# Patient Record
Sex: Male | Born: 1960 | Race: White | Hispanic: No | Marital: Single | Smoking: Current every day smoker
Health system: Southern US, Community
[De-identification: ages and names within clinical notes are randomized; demographics above are authoritative.]

## PROBLEM LIST (undated history)

## (undated) DIAGNOSIS — I509 Heart failure, unspecified: Secondary | ICD-10-CM

## (undated) DIAGNOSIS — I2510 Atherosclerotic heart disease of native coronary artery without angina pectoris: Secondary | ICD-10-CM

## (undated) DIAGNOSIS — M1990 Unspecified osteoarthritis, unspecified site: Secondary | ICD-10-CM

## (undated) DIAGNOSIS — I10 Essential (primary) hypertension: Secondary | ICD-10-CM

## (undated) DIAGNOSIS — J449 Chronic obstructive pulmonary disease, unspecified: Secondary | ICD-10-CM

## (undated) DIAGNOSIS — E785 Hyperlipidemia, unspecified: Secondary | ICD-10-CM

## (undated) DIAGNOSIS — G8929 Other chronic pain: Secondary | ICD-10-CM

## (undated) HISTORY — PX: OTHER SURGICAL HISTORY: SHX169

---

## 2018-08-22 ENCOUNTER — Ambulatory Visit
Admission: EM | Admit: 2018-08-22 | Discharge: 2018-08-22 | Disposition: A | Discharge status: Home / Self Care | Payer: Medicare Other

## 2018-08-22 ENCOUNTER — Other Ambulatory Visit: Payer: Self-pay

## 2018-08-22 DIAGNOSIS — G4700 Insomnia, unspecified: Secondary | ICD-10-CM | POA: Diagnosis not present

## 2018-08-22 HISTORY — DX: Chronic obstructive pulmonary disease, unspecified: J44.9

## 2018-08-22 HISTORY — DX: Unspecified osteoarthritis, unspecified site: M19.90

## 2018-08-22 HISTORY — DX: Heart failure, unspecified: I50.9

## 2018-08-22 HISTORY — DX: Atherosclerotic heart disease of native coronary artery without angina pectoris: I25.10

## 2018-08-22 HISTORY — DX: Essential (primary) hypertension: I10

## 2018-08-22 MED ORDER — TEMAZEPAM 15 MG PO CAPS: 15.0000 mg | ORAL_CAPSULE | Freq: Every evening | ORAL | 0 refills | Status: DC | PRN
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
## 2018-08-22 NOTE — ED Triage Notes (Signed)
Pt states hasn't slept good in the past 3wks, states going 48hrs with out being able to sleep. States he is stressed from not sleeping and now having headaches. States used OTC sleep aide with no relief.

## 2018-08-22 NOTE — Discharge Instructions (Signed)
Start temazepam one pill every 2-3 days to help with insomnia at this time. Maintain sleep hygiene as discussed on attachment. Follow up with PCP for further evaluation needed.

## 2018-08-31 ENCOUNTER — Ambulatory Visit
Admission: EM | Admit: 2018-08-31 | Discharge: 2018-08-31 | Disposition: A | Discharge status: Home / Self Care | Payer: Medicare Other | Attending: Physician Assistant | Admitting: Physician Assistant

## 2018-08-31 ENCOUNTER — Other Ambulatory Visit: Payer: Self-pay

## 2018-08-31 ENCOUNTER — Encounter: Payer: Self-pay | Admitting: Emergency Medicine

## 2018-08-31 DIAGNOSIS — M545 Low back pain, unspecified: Secondary | ICD-10-CM

## 2018-08-31 MED ORDER — PREDNISONE 50 MG PO TABS: 50.0000 mg | ORAL_TABLET | Freq: Every day | ORAL | 0 refills | Status: DC

## 2018-08-31 MED ORDER — TIZANIDINE HCL 4 MG PO TABS: 4.0000 mg | ORAL_TABLET | Freq: Four times a day (QID) | ORAL | 0 refills | Status: DC | PRN

## 2018-08-31 NOTE — Discharge Instructions (Signed)
Start prednisone as directed. Tizanidine as needed, this can make you drowsy, so do not take if you are going to drive, operate heavy machinery, or make important decisions. Ice/heat compresses as needed. This can take up to 3-4 weeks to completely resolve, but you should be feeling better each week. Follow up with PCP if symptoms worsen, changes for reevaluation. If experience numbness/tingling of the inner thighs, loss of bladder or bowel control, go to the emergency department for evaluation.

## 2018-08-31 NOTE — ED Provider Notes (Signed)
[facility]    CSN: [id number] Arrival date & time: 08/31/18  1123      History   Chief Complaint Chief Complaint  Patient presents with  . Back Pain    HPI Troy French is a 58 y.o. male.   58 year old male comes in for 3-day history of right lumbar back pain.  Denies injury/trauma.  He has history of back pain requiring corticosteroid injections to the spine.  He is to be on narcotics to control back pain, but has not had it filled since he moved to [place].  States he woke up with this pain, that is worse with movement, better with sitting.  At times the pain can radiate down the leg.  Denies saddle anesthesia, loss of bladder or bowel control.  He has been using Tylenol, aspirin, heat and ice compress with mild relief.  States given he has an interview in 2 days, he would like to try other medications to help with symptoms improve.     Past Medical History:  Diagnosis Date  . Arthritis   . CHF (congestive heart failure) ([place])   . COPD (chronic obstructive pulmonary disease) ([place])   . Coronary artery disease   . Hypertension     There are no active problems to display for this patient.   Past Surgical History:  Procedure Laterality Date  . cardiac stents         Home Medications    Prior to Admission medications   Medication Sig Start Date End Date Taking? Authorizing Provider  aspirin 81 MG chewable tablet Chew by mouth daily.    [provider]  clopidogrel (PLAVIX) 75 MG tablet Take 75 mg by mouth daily.    [provider]  diltiazem (DILACOR XR) 240 MG 24 hr capsule Take 240 mg by mouth daily.    [provider]  HYDROcodone-acetaminophen (NORCO) 10-325 MG tablet Take 1 tablet by mouth 3 (three) times daily.    [provider]  lisinopril (ZESTRIL) 10 MG tablet Take 10 mg by mouth daily.    [provider]  morphine (MS CONTIN) 30 MG 12 hr tablet Take 30 mg by mouth every 12 (twelve) hours.     [provider]  predniSONE (DELTASONE) 50 MG tablet Take 1 tablet (50 mg total) by mouth daily with breakfast. 08/31/18   Tasia Catchings, Aamari West V, PA-C  temazepam (RESTORIL) 15 MG capsule Take 1 capsule (15 mg total) by mouth at bedtime as needed for sleep. 08/22/18   Tasia Catchings, Stevee Valenta V, PA-C  tiZANidine (ZANAFLEX) 4 MG tablet Take 1 tablet (4 mg total) by mouth every 6 (six) hours as needed for muscle spasms. 08/31/18   Tasia Catchings, Arlena Marsan V, PA-C  traZODone (DESYREL) 50 MG tablet Take 50 mg by mouth at bedtime.    [provider]    Family History History reviewed. No pertinent family history.  Social History Social History   Tobacco Use  . Smoking status: Current Every Day Smoker  . Smokeless tobacco: Never Used  Substance Use Topics  . Alcohol use: Not Currently  . Drug use: Not on file     Allergies   Nitroglycerin   Review of Systems Review of Systems  Reason unable to perform ROS: See HPI as above.     Physical Exam Triage Vital Signs ED Triage Vitals  Enc Vitals Group     BP 08/31/18 1133 (!) 144/98     Pulse Rate 08/31/18 1133 98     Resp  08/31/18 1133 18     Temp 08/31/18 1133 98 F (36.7 C)     Temp Source 08/31/18 1133 Oral     SpO2 08/31/18 1133 98 %     Weight --      Height --      Head Circumference --      Peak Flow --      Pain Score 08/31/18 1134 8     Pain Loc --      Pain Edu? --      Excl. in [place]? --    No data found.  Updated Vital Signs BP (!) 144/98 (BP Location: Right Arm)   Pulse 98   Temp 98 F (36.7 C) (Oral)   Resp 18   SpO2 98%   Physical Exam Constitutional:      General: He is not in acute distress.    Appearance: He is well-developed. He is not diaphoretic.  HENT:     Head: Normocephalic and atraumatic.  Eyes:     Conjunctiva/sclera: Conjunctivae normal.     Pupils: Pupils are equal, round, and reactive to light.  Cardiovascular:     Rate and Rhythm: Normal rate and regular rhythm.     Heart sounds: Normal heart sounds. No murmur.  No friction rub. No gallop.   Pulmonary:     Effort: Pulmonary effort is normal. No accessory muscle usage or respiratory distress.     Breath sounds: Normal breath sounds. No stridor. No decreased breath sounds, wheezing, rhonchi or rales.  Musculoskeletal:     Comments: No tenderness on palpation of the spinous process. Tenderness to palpation along right lumbar paraspinal area. Decreased ROM of back and hips. Full passive ROM of hips. NVI. Negative straight leg raise.  Skin:    General: Skin is warm and dry.  Neurological:     Mental Status: He is alert and oriented to person, place, and time.    UC Treatments / Results  Labs (all labs ordered are listed, but only abnormal results are displayed) Labs Reviewed - No data to display  EKG   Radiology No results found.  Procedures Procedures (including critical care time)  Medications Ordered in UC Medications - No data to display  Initial Impression / Assessment and Plan / UC Course  I have reviewed the triage vital signs and the nursing notes.  Pertinent labs & imaging results that were available during my care of the patient were reviewed by me and considered in my medical decision making (see chart for details).    We will start prednisone as directed.  Tizanidine as needed.  Offered Toradol injection in office today, patient declined at this time.  Continue other symptomatic treatment.  Return precautions given.  Patient expresses understanding and agrees with plan.  Final Clinical Impressions(s) / UC Diagnoses   Final diagnoses:  Acute right-sided low back pain without sciatica    ED Prescriptions    Medication Sig Dispense Auth. Provider   predniSONE (DELTASONE) 50 MG tablet Take 1 tablet (50 mg total) by mouth daily with breakfast. 5 tablet Eyan Hagood V, PA-C   tiZANidine (ZANAFLEX) 4 MG tablet Take 1 tablet (4 mg total) by mouth every 6 (six) hours as needed for muscle spasms. 15 tablet Threasa AlphaYu, Dayne Dekay V, PA-C         Admire Bunnell V, [place]PA-C 08/31/18 1215

## 2018-08-31 NOTE — ED Triage Notes (Signed)
Pt sts lower back pain on right side x 3 days with hx of same

## 2018-11-20 ENCOUNTER — Ambulatory Visit: Payer: Medicare Other | Admitting: Cardiovascular Disease

## 2018-12-17 ENCOUNTER — Other Ambulatory Visit: Payer: Self-pay

## 2018-12-17 ENCOUNTER — Encounter: Payer: Self-pay | Admitting: Cardiovascular Disease

## 2018-12-17 ENCOUNTER — Telehealth: Payer: Self-pay | Admitting: Radiology

## 2018-12-17 ENCOUNTER — Ambulatory Visit (INDEPENDENT_AMBULATORY_CARE_PROVIDER_SITE_OTHER): Payer: Medicare Other | Admitting: Cardiovascular Disease

## 2018-12-17 VITALS — BP 100/72 | HR 80 | Temp 98.4°F | Ht 66.0 in | Wt 176.0 lb

## 2018-12-17 DIAGNOSIS — R0789 Other chest pain: Secondary | ICD-10-CM | POA: Diagnosis not present

## 2018-12-17 DIAGNOSIS — I2510 Atherosclerotic heart disease of native coronary artery without angina pectoris: Secondary | ICD-10-CM | POA: Diagnosis not present

## 2018-12-17 DIAGNOSIS — I409 Acute myocarditis, unspecified: Secondary | ICD-10-CM

## 2018-12-17 DIAGNOSIS — R002 Palpitations: Secondary | ICD-10-CM

## 2018-12-17 DIAGNOSIS — J449 Chronic obstructive pulmonary disease, unspecified: Secondary | ICD-10-CM | POA: Insufficient documentation

## 2018-12-17 DIAGNOSIS — I2583 Coronary atherosclerosis due to lipid rich plaque: Secondary | ICD-10-CM

## 2018-12-17 DIAGNOSIS — E785 Hyperlipidemia, unspecified: Secondary | ICD-10-CM | POA: Insufficient documentation

## 2018-12-17 DIAGNOSIS — I10 Essential (primary) hypertension: Secondary | ICD-10-CM | POA: Insufficient documentation

## 2018-12-17 DIAGNOSIS — E782 Mixed hyperlipidemia: Secondary | ICD-10-CM

## 2018-12-17 DIAGNOSIS — Z720 Tobacco use: Secondary | ICD-10-CM | POA: Insufficient documentation

## 2018-12-17 MED ORDER — ATORVASTATIN CALCIUM 40 MG PO TABS: 40.0000 mg | ORAL_TABLET | Freq: Every day | ORAL | 3 refills | Status: DC

## 2018-12-17 NOTE — Progress Notes (Signed)
12/17/2018 Troy French   09-Feb-1960  [id number]  Primary Physician Sonia Side., FNP Primary Cardiologist: Lorretta Harp MD Garret Reddish, [place], [place]  HPI:  Troy French is a 58 y.o. thin appearing single Caucasian male father of 2 children, grandfather of 2 grandchildren referred by Dustin Folks, FNP to be established my practice and for cardiovascular valuation.  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  His risk factors include 50 pack years of tobacco use continue to smoke 1 pack a day recalcitrant to structure modification.  He does have history of essential hypertension hyperlipidemia.  He has COPD.  His father died at age 46 of CHF and had stents and brother has had stents as well.  He has had at least 3 myocardial infarction's dating back to the early 2000's.  He has had stents in [place], in [place] and most recently in [place] in 2015.  He does get fairly frequent atypical quick burning chest pain with occasional chest pressure.  He also complains of palpitations and shortness of breath most likely related to his COPD.   Current Meds  Medication Sig  . aspirin 81 MG chewable tablet Chew by mouth daily.  . clopidogrel (PLAVIX) 75 MG tablet Take 75 mg by mouth daily.  Marland Kitchen diltiazem (DILACOR XR) 240 MG 24 hr capsule Take 240 mg by mouth daily.  Marland Kitchen HYDROcodone-acetaminophen (NORCO) 10-325 MG tablet Take 1 tablet by mouth 3 (three) times daily.  Marland Kitchen lisinopril (ZESTRIL) 20 MG tablet Take 20 mg by mouth daily.  Marland Kitchen morphine (MS CONTIN) 30 MG 12 hr tablet Take 30 mg by mouth every 12 (twelve) hours.  . [DISCONTINUED] lisinopril (ZESTRIL) 10 MG tablet Take 10 mg by mouth daily.  . [DISCONTINUED] predniSONE (DELTASONE) 50 MG tablet Take 1 tablet (50 mg total) by mouth daily with breakfast.  . [DISCONTINUED] temazepam (RESTORIL) 15 MG capsule Take 1 capsule (15 mg total) by mouth at bedtime as needed for sleep.  . [DISCONTINUED] tiZANidine (ZANAFLEX) 4 MG tablet Take 1 tablet  (4 mg total) by mouth every 6 (six) hours as needed for muscle spasms.  . [DISCONTINUED] traZODone (DESYREL) 50 MG tablet Take 50 mg by mouth at bedtime.     Allergies  Allergen Reactions  . Nitroglycerin     Social History   Socioeconomic History  . Marital status: Single    Spouse name: Not on file  . Number of children: Not on file  . Years of education: Not on file  . Highest education level: Not on file  Occupational History  . Not on file  Social Needs  . Financial resource strain: Not on file  . Food insecurity    Worry: Not on file    Inability: Not on file  . Transportation needs    Medical: Not on file    Non-medical: Not on file  Tobacco Use  . Smoking status: Current Every Day Smoker  . Smokeless tobacco: Never Used  Substance and Sexual Activity  . Alcohol use: Not Currently  . Drug use: Not on file  . Sexual activity: Not on file  Lifestyle  . Physical activity    Days per week: Not on file    Minutes per session: Not on file  . Stress: Not on file  Relationships  . Social Herbalist on phone: Not on file    Gets together: Not on file    Attends religious service: Not on file  Active member of club or organization: Not on file    Attends meetings of clubs or organizations: Not on file    Relationship status: Not on file  . Intimate partner violence    Fear of current or ex partner: Not on file    Emotionally abused: Not on file    Physically abused: Not on file    Forced sexual activity: Not on file  Other Topics Concern  . Not on file  Social History Narrative  . Not on file     Review of Systems: General: negative for chills, fever, night sweats or weight changes.  Cardiovascular: negative for chest pain, dyspnea on exertion, edema, orthopnea, palpitations, paroxysmal nocturnal dyspnea or shortness of breath Dermatological: negative for rash Respiratory: negative for cough or wheezing Urologic: negative for hematuria  Abdominal: negative for nausea, vomiting, diarrhea, bright red blood per rectum, melena, or hematemesis Neurologic: negative for visual changes, syncope, or dizziness All other systems reviewed and are otherwise negative except as noted above.    Blood pressure 100/72, pulse 80, temperature 98.4 F (36.9 C), height 5\' 6"  (1.676 m), weight 176 lb (79.8 kg).  General appearance: alert and no distress Neck: no adenopathy, no carotid bruit, no JVD, supple, symmetrical, trachea midline and thyroid not enlarged, symmetric, no tenderness/mass/nodules Lungs: clear to auscultation bilaterally Heart: regular rate and rhythm, S1, S2 normal, no murmur, click, rub or gallop Extremities: extremities normal, atraumatic, no cyanosis or edema Pulses: 2+ and symmetric Skin: Skin color, texture, turgor normal. No rashes or lesions Neurologic: Alert and oriented X 3, normal strength and tone. Normal symmetric reflexes. Normal coordination and gait  EKG sinus rhythm at 80 with nonspecific ST and T wave changes.  I personally reviewed this EKG.  ASSESSMENT AND PLAN:   Tobacco abuse History of 50-pack-year tobacco abuse currently smoking 1 pack/day recalcitrant to risk factor modification.  Essential hypertension History of essential potential blood pressure measured today at 100/72.  He is on diltiazem and lisinopril.  Hyperlipidemia History of hyperlipidemia on atorvastatin which she admits to not taking with recent blood work performed by his PCP 10/11/2018 revealing total cholesterol of 238, LDL of 142 and HDL of 50.  His triglyceride level was 325.  And will start him on atorvastatin 40 mg L we will recheck a lipid liver profile in 2 months.  Palpitations History of palpitations.  We will get a 2-week Zio patch to further evaluate  Coronary artery disease History of CAD status post at least 2 myocardial infarctions dating back to the early 2000's.  She has had 11 stents total in [place],  [place] and most recently in [place] in 2015.  He does get atypical chest pain characterized by a sharp pinching pain but also an occasional dull pain.  He says that his doctor stopped getting stress test on in the past for unclear reasons.  And then to get a pharmacologic Myoview stress test and 2D echo to further evaluate.  We will obtain his old records from [place] as well.      [place] MD FACP,FACC,FAHA, [facility] 12/17/2018 11:19 AM

## 2018-12-17 NOTE — Assessment & Plan Note (Signed)
History of hyperlipidemia on atorvastatin which she admits to not taking with recent blood work performed by his PCP 10/11/2018 revealing total cholesterol of 238, LDL of 142 and HDL of 50.  His triglyceride level was 325.  And will start him on atorvastatin 40 mg L we will recheck a lipid liver profile in 2 months.

## 2018-12-17 NOTE — Assessment & Plan Note (Signed)
History of CAD status post at least 2 myocardial infarctions dating back to the early 2000's.  She has had 11 stents total in [place] and most recently in [place] in 2015.  He does get atypical chest pain characterized by a sharp pinching pain but also an occasional dull pain.  He says that his doctor stopped getting stress test on in the past for unclear reasons.  And then to get a pharmacologic Myoview stress test and 2D echo to further evaluate.  We will obtain his old records from [place] as well.

## 2018-12-17 NOTE — Patient Instructions (Addendum)
Medication Instructions:  Start taking 40mg  Atorvastatin Daily.   If you need a refill on your cardiac medications before your next appointment, please call your pharmacy.   Lab work: Lipids and Hepatic Function in 2 months  If you have labs (blood work) drawn today and your tests are completely normal, you will receive your results only by: MyChart Message (if you have MyChart) OR A paper copy in the mail If you have any lab test that is abnormal or we need to change your treatment, we will call you to review the results.  Testing/Procedures: Your physician has requested that you have an echocardiogram. Echocardiography is a painless test that uses sound waves to create images of your heart. It provides your doctor with information about the size and shape of your heart and how well your heart's chambers and valves are working. This procedure takes approximately one hour. There are no restrictions for this procedure. [address]  AND  Your physician has requested that you have a lexiscan myoview. For further information please visit [address]. Please follow instruction sheet, as given.  AND   Your physician has recommended that you wear an event monitor. Event monitors are medical devices that record the heart's electrical activity. Doctors most often [email or web address] these monitors to diagnose arrhythmias. Arrhythmias are problems with the speed or rhythm of the heartbeat. The monitor is a small, portable device. You can wear one while you do your normal daily activities. This is usually used to diagnose what is causing palpitations/syncope (passing out).  Follow-Up: At [facility], you and your health needs are our priority.  As part of our continuing mission to provide you with exceptional heart care, we have created designated Provider Care Teams.  These Care Teams include your primary Cardiologist (physician) and Advanced Practice Providers (APPs -  Physician  Assistants and Nurse Practitioners) who all work together to provide you with the care you need, when you need it. You may see Dr [facility] or one of the following Advanced Practice Providers on your designated Care Team:    Allyson Sabal, PA-C  [place], [place]  [place], Edd Fabian Your physician wants you to follow-up in: 3 months   ZIO XT- Long Term Monitor Instructions   Your physician has requested you wear your ZIO patch monitor 14 days.   This is a single patch monitor.  [employer] supplies one patch monitor per enrollment.  Additional stickers are not available.   Please do not apply patch if you will be having a Nuclear Stress Test, Echocardiogram, Cardiac CT, MRI, or Chest Xray during the time frame you would be wearing the monitor. The patch cannot be worn during these tests.  You cannot remove and re-apply the ZIO XT patch monitor.   Your ZIO patch monitor will be sent USPS Priority mail from [facility] directly to your home address. The monitor may also be mailed to a PO BOX if home delivery is not available.   It may take 3-5 days to receive your monitor after you have been enrolled.   Once you have received you monitor, please review enclosed instructions.  Your monitor has already been registered assigning a specific monitor serial # to you.   Applying the monitor   Shave hair from upper left chest.   Hold abrader disc by orange tab.  Rub abrader in 40 strokes over left upper chest as indicated in your monitor instructions.   Clean area with 4 enclosed alcohol pads .  Use all pads to assure are is cleaned thoroughly.  Let dry.   Apply patch as indicated in monitor instructions.  Patch will be place under collarbone on left side of chest with arrow pointing upward.   Rub patch adhesive wings for 2 minutes.Remove white label marked "1".  Remove white label marked "2".  Rub patch adhesive wings for 2 additional minutes.   While looking in a mirror, press and  release button in center of patch.  A small green light will flash 3-4 times .  This will be your only indicator the monitor has been turned on.     Do not shower for the first 24 hours.  You may shower after the first 24 hours.   Press button if you feel a symptom. You will hear a small click.  Record Date, Time and Symptom in the Patient Log Book.   When you are ready to remove patch, follow instructions on last 2 pages of Patient Log Book.  Stick patch monitor onto last page of Patient Log Book.   Place Patient Log Book in [place] box.  Use locking tab on box and tape box closed securely.  The [employer] and [employer] has [employer] on it.  Please place in mailbox as soon as possible.  Your physician should have your test results approximately 7 days after the monitor has been mailed back to [facility].   Call [place] at [phone] if you have questions regarding your ZIO XT patch monitor.  Call them immediately if you see an orange light blinking on your monitor.   If your monitor falls off in less than 4 days contact our Monitor department at [phone].  If your monitor becomes loose or falls off after 4 days call [employer] at [phone] for suggestions on securing your monitor.

## 2018-12-17 NOTE — Assessment & Plan Note (Signed)
History of essential potential blood pressure measured today at 100/72.  He is on diltiazem and lisinopril.

## 2018-12-17 NOTE — Assessment & Plan Note (Signed)
History of palpitations.  We will get a 2-week Zio patch to further evaluate

## 2018-12-17 NOTE — Assessment & Plan Note (Signed)
History of 50-pack-year tobacco abuse currently smoking 1 pack/day recalcitrant to risk factor modification.

## 2018-12-17 NOTE — Telephone Encounter (Signed)
Enrolled patient for a 14 day Telemetry Zio monitor to be mailed to patients home.

## 2018-12-19 ENCOUNTER — Telehealth (HOSPITAL_COMMUNITY): Payer: Self-pay

## 2018-12-19 NOTE — Telephone Encounter (Signed)
Encounter complete. 

## 2018-12-20 ENCOUNTER — Telehealth (HOSPITAL_COMMUNITY): Payer: Self-pay | Admitting: *Deleted

## 2018-12-20 ENCOUNTER — Telehealth (HOSPITAL_COMMUNITY): Payer: Self-pay

## 2018-12-20 NOTE — Telephone Encounter (Signed)
Close encounter 

## 2018-12-20 NOTE — Telephone Encounter (Signed)
Encounter complete. 

## 2018-12-24 ENCOUNTER — Inpatient Hospital Stay (HOSPITAL_COMMUNITY): Admission: RE | Admit: 2018-12-24 | Payer: Medicare Other | Source: Ambulatory Visit

## 2018-12-24 ENCOUNTER — Encounter (HOSPITAL_COMMUNITY): Payer: Medicare Other

## 2018-12-24 ENCOUNTER — Telehealth: Payer: Self-pay | Admitting: *Deleted

## 2018-12-24 NOTE — Telephone Encounter (Signed)
Spoke with patient to reschedule myoview appointment cancelled 12/24/18 at 7:15am..  Patient will call back to reschedule once he has spoken with his insurance company.

## 2018-12-27 ENCOUNTER — Ambulatory Visit (HOSPITAL_COMMUNITY): Payer: Medicare Other | Attending: Cardiology

## 2018-12-27 ENCOUNTER — Encounter (HOSPITAL_COMMUNITY): Payer: Self-pay | Admitting: Cardiovascular Disease

## 2018-12-27 ENCOUNTER — Other Ambulatory Visit: Payer: Self-pay

## 2018-12-27 ENCOUNTER — Encounter (INDEPENDENT_AMBULATORY_CARE_PROVIDER_SITE_OTHER): Payer: Medicare Other

## 2018-12-27 DIAGNOSIS — R0789 Other chest pain: Secondary | ICD-10-CM | POA: Diagnosis not present

## 2018-12-27 DIAGNOSIS — I2510 Atherosclerotic heart disease of native coronary artery without angina pectoris: Secondary | ICD-10-CM | POA: Diagnosis not present

## 2018-12-27 DIAGNOSIS — I2583 Coronary atherosclerosis due to lipid rich plaque: Secondary | ICD-10-CM | POA: Diagnosis present

## 2018-12-27 DIAGNOSIS — R002 Palpitations: Secondary | ICD-10-CM | POA: Diagnosis not present

## 2018-12-27 DIAGNOSIS — I409 Acute myocarditis, unspecified: Secondary | ICD-10-CM

## 2019-01-07 ENCOUNTER — Telehealth: Payer: Self-pay

## 2019-01-07 ENCOUNTER — Telehealth: Payer: Self-pay | Admitting: Cardiovascular Disease

## 2019-01-07 NOTE — Telephone Encounter (Signed)
New Message  Pt c/o of Chest Pain: STAT if CP now or developed within 24 hours  1. Are you having CP right now? No  2. Are you experiencing any other symptoms (ex. SOB, nausea, vomiting, sweating)? SOB, nausea, vomiting, sweating  3. How long have you been experiencing CP? Since Sunday Night  4. Is your CP continuous or coming and going? continuous  5. Have you taken Nitroglycerin? No; allergic to it ?

## 2019-01-07 NOTE — Telephone Encounter (Signed)
Attempted to call patient in reference to triage call. Patient phone goes to voicemail. Message left for patient to go to the nearest emergency room.   Pt c/o of Chest Pain: STAT if CP now or developed within 24 hours  1. Are you having CP right now? No  2. Are you experiencing any other symptoms (ex. SOB, nausea, vomiting, sweating)? SOB, nausea, vomiting, sweating  3. How long have you been experiencing CP? Since Sunday Night  4. Is your CP continuous or coming and going? continuous  5. Have you taken Nitroglycerin? No; allergic to it ?

## 2019-01-07 NOTE — Telephone Encounter (Signed)
See other epic note.

## 2019-01-09 NOTE — Telephone Encounter (Signed)
Hi there,  I'm covering Jesse's inbox through Friday. I'll make sure he is aware, thank you for the heads up.  Can we get records from him PCP prior to [place] office visit? Looks like his PCP is located at [facility]. Their phone number is [phone]. I don't see a fax # on their web site unfortunately.   Thanks so much! Loel Dubonnet, NP

## 2019-01-09 NOTE — Telephone Encounter (Signed)
Pt called back regarding chest pain. Stated that he called EMS on Monday d/t chest pain, N/V, impending feeling of doom. Stated EMS told him that the ER was very busy and offered to take him but the pt stated he did not go because the symptoms eased up. Asked pt if he was currently having chest pain or any other symptoms, denied and said he felt a lot better. Stated he went to his PCP and they tested him for COVID but he is not having any symptoms and does not think he has it. Also stated that they took a EKG and it was not any different from the last one. Asked pt why he did not get his stress test, stated his insurance never gave him a straight answer about costs and he couldn't afford it. Advised pt that he should be seen in the office. Made an appt with Coletta Memos on 01/16/19. Advised pt to call 911 again if the symptoms return and sustain. Pt verbalized understanding.

## 2019-01-09 NOTE — Telephone Encounter (Signed)
LM2CB regarding chest pain 

## 2019-01-15 NOTE — Progress Notes (Deleted)
Cardiology Clinic Note   Patient Name: Troy French Date of Encounter: 01/15/2019  Primary Care Provider:  [name]., FNP Primary Cardiologist:  Nanetta Batty, MD  Patient Profile    Troy French 59 year old male presents today for a follow up evaluation of his chest pain.  Past Medical History    Past Medical History:  Diagnosis Date  . Arthritis   . CHF (congestive heart failure) (HCC)   . COPD (chronic obstructive pulmonary disease) (HCC)   . Coronary artery disease   . Hypertension    Past Surgical History:  Procedure Laterality Date  . cardiac stents      Allergies  Allergies  Allergen Reactions  . Nitroglycerin     History of Present Illness    Troy French has a past medical history of essential hypertension, coronary artery disease, COPD, tobacco abuse, hyperlipidemia, palpitations, and atypical chest pain.  He is a Programmer, multimedia.  When he saw Dr. Allyson Sabal last on 12/17/2018 he continued to smoke 1 pack/day and was not ready to stop smoking at that time.  His father died at age 51 from CHF and had a history of coronary stenting.  His brother has a history of CAD as well.  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  His last stents placed in [place], [place], and most recently in [place] in 2015, 11 total stents.  He has a history of frequent atypical quick burning chest pain with chest pressure.  He also has complaints of palpitations and shortness of breath which are believed to be related to his COPD.  Normal echocardiogram 12/17/2018.  He was most recently seen on 12/17/2018 by Dr. Allyson Sabal who recommended a  Myoview stress test however, patient did not get test due to insurance not informing him how much the procedure would cost.  He called the triage nurse line on 01/09/2019 with complaints of chest discomfort.  He stated he had called EMS which presented to his house and performed an EKG which she said was normal.  He did not present  to the ED due to a decrease in his symptoms.  He stated that he went to his PCP which tested him for COVID-19.  He presents to the clinic today and states***  *** denies chest pain, shortness of breath, lower extremity edema, fatigue, palpitations, melena, hematuria, hemoptysis, diaphoresis, weakness, presyncope, syncope, orthopnea, and PND.   Home Medications    Prior to Admission medications   Medication Sig Start Date End Date Taking? Authorizing Provider  aspirin 81 MG chewable tablet Chew by mouth daily.    [provider]  atorvastatin (LIPITOR) 40 MG tablet Take 1 tablet (40 mg total) by mouth daily. 12/17/18 03/17/19  Runell Gess, MD  clopidogrel (PLAVIX) 75 MG tablet Take 75 mg by mouth daily.    [provider]  diltiazem (DILACOR XR) 240 MG 24 hr capsule Take 240 mg by mouth daily.    [provider]  HYDROcodone-acetaminophen (NORCO) 10-325 MG tablet Take 1 tablet by mouth 3 (three) times daily.    [provider]  lisinopril (ZESTRIL) 20 MG tablet Take 20 mg by mouth daily.    [provider]  morphine (MS CONTIN) 30 MG 12 hr tablet Take 30 mg by mouth every 12 (twelve) hours.    [provider]    Family History    Family History  Problem Relation Age of Onset  . Diabetes Mother  He indicated that his mother is deceased. He indicated that his father is deceased. He indicated that his maternal grandmother is deceased. He indicated that his maternal grandfather is deceased. He indicated that his paternal grandmother is deceased. He indicated that his paternal grandfather is deceased.  Social History    Social History   Socioeconomic History  . Marital status: Single    Spouse name: Not on file  . Number of children: Not on file  . Years of education: Not on file  . Highest education level: Not on file  Occupational History  . Not on file  Tobacco Use  . Smoking status: Current Every Day Smoker  .  Smokeless tobacco: Never Used  Substance and Sexual Activity  . Alcohol use: Not Currently  . Drug use: Not on file  . Sexual activity: Not on file  Other Topics Concern  . Not on file  Social History Narrative  . Not on file   Social Determinants of Health   Financial Resource Strain:   . Difficulty of Paying Living Expenses: Not on file  Food Insecurity:   . Worried About Charity fundraiser in the Last Year: Not on file  . Ran Out of Food in the Last Year: Not on file  Transportation Needs:   . Lack of Transportation (Medical): Not on file  . Lack of Transportation (Non-Medical): Not on file  Physical Activity:   . Days of Exercise per Week: Not on file  . Minutes of Exercise per Session: Not on file  Stress:   . Feeling of Stress : Not on file  Social Connections:   . Frequency of Communication with Friends and Family: Not on file  . Frequency of Social Gatherings with Friends and Family: Not on file  . Attends Religious Services: Not on file  . Active Member of Clubs or Organizations: Not on file  . Attends Archivist Meetings: Not on file  . Marital Status: Not on file  Intimate Partner Violence:   . Fear of Current or Ex-Partner: Not on file  . Emotionally Abused: Not on file  . Physically Abused: Not on file  . Sexually Abused: Not on file     Review of Systems    General:  No chills, fever, night sweats or weight changes.  Cardiovascular:  No chest pain, dyspnea on exertion, edema, orthopnea, palpitations, paroxysmal nocturnal dyspnea. Dermatological: No rash, lesions/masses Respiratory: No cough, dyspnea Urologic: No hematuria, dysuria Abdominal:   No nausea, vomiting, diarrhea, bright red blood per rectum, melena, or hematemesis Neurologic:  No visual changes, wkns, changes in mental status. All other systems reviewed and are otherwise negative except as noted above.  Physical Exam    VS:  There were no vitals taken for this visit. , BMI There  is no height or weight on file to calculate BMI. GEN: Well nourished, well developed, in no acute distress. HEENT: normal. Neck: Supple, no JVD, carotid bruits, or masses. Cardiac: RRR, no murmurs, rubs, or gallops. No clubbing, cyanosis, edema.  Radials/DP/PT 2+ and equal bilaterally.  Respiratory:  Respirations regular and unlabored, clear to auscultation bilaterally. GI: Soft, nontender, nondistended, BS + x 4. MS: no deformity or atrophy. Skin: warm and dry, no rash. Neuro:  Strength and sensation are intact. Psych: Normal affect.  Accessory Clinical Findings    ECG personally reviewed by me today- *** - No acute changes  EKG 12/17/2018 Normal sinus rhythm 80 bpm  IMPRESSIONS   1. Left ventricular  ejection fraction, by visual estimation, is 60 to 65%. The left ventricle has normal function. There is no left ventricular hypertrophy.  2. The left ventricle has no regional wall motion abnormalities.  3. Global right ventricle has normal systolic function.The right ventricular size is normal.  4. Left atrial size was normal.  5. Right atrial size was normal.  6. The mitral valve is normal in structure. Trivial mitral valve regurgitation. No evidence of mitral stenosis.  7. The tricuspid valve is normal in structure. Tricuspid valve regurgitation is trivial.  8. The aortic valve is tricuspid. Aortic valve regurgitation is not visualized. Mild aortic valve sclerosis without stenosis.  9. The pulmonic valve was normal in structure. Pulmonic valve regurgitation is not visualized. 10. The inferior vena cava is normal in size with greater than 50% respiratory variability, suggesting right atrial pressure of 3 mmHg. 11. Normal LV systolic and diastolic function; trace MR and TR.   Assessment & Plan   1.  Coronary artery disease-no chest pain today.  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  Echocardiogram 12/17/2018 showed normal EF and  no wall motion abnormalities. Reorder nuclear stress test Continue Follow-up request for [facility] medical records  Essential hypertension-blood pressure today*** Continue diltiazem Continue lisinopril Heart healthy low-sodium diet-salty 6 given  Palpitations-2 weeks Zio patch ordered 12/17/2018 Continue to monitor  Hyperlipidemia-LDL 142 Continue atorvastatin 40 mg Heart healthy low-sodium high-fiber diet Increase physical activity as tolerated Repeat lipid panel and LFTs in 1 month  Disposition: Follow-up with Dr. Allyson Sabal after stress test  Thomasene Ripple. Savyon Loken NP-C        [facility] [address] Office [phone] Fax [phone]

## 2019-01-16 ENCOUNTER — Ambulatory Visit: Payer: Medicare Other | Admitting: General Practice

## 2019-01-16 ENCOUNTER — Telehealth: Payer: Self-pay

## 2019-01-16 NOTE — Telephone Encounter (Signed)
CALLED PT AND RESCHEDULED APPOINTMENT FOR TO Monday WITH [facility]. HAVE NOT RECEIVED COVID TEST RESULTS FOR HIM TO COME TO APPOINTMENT. WILL CALL PCP TO GET RESULTS. PT STATES THAT HIS CHEST PAIN IS "BETTER NOW IT IS TOLERABLE" INFORMED PT THAT HE SHOULD NOT WAIT UNTIL APPT AND TO GO TO ER IF CP RETURNS. HE STATES THAT HE CANNOT GO NOW HE HAS TO GO BACK TO THE HOSPITAL HIS BROTHER IS ADMITTED THERE. HE VERBALIZED THAT HE WILL GO IF HE FEELS THAT HE NEEDS TO WHEN THE CP RETURNS. HE WILL BE HERE EARLY FOR HIS APPT ON Monday.

## 2019-01-20 ENCOUNTER — Encounter: Payer: Medicare HMO | Admitting: Physician Assistant

## 2019-01-22 NOTE — Progress Notes (Signed)
This encounter was created in error - please disregard.

## 2019-01-24 ENCOUNTER — Telehealth: Payer: Self-pay | Admitting: Radiology

## 2019-01-24 NOTE — Telephone Encounter (Signed)
Spoke to [employer] [facility] supervisor) to make aware of abnormal Zio AT monitor. Patient has had chest pain and called the office a few times. Kim will alert Dr. Allyson Sabal as its after hours heading into a weekend.

## 2019-01-31 ENCOUNTER — Telehealth: Payer: Self-pay

## 2019-01-31 NOTE — Telephone Encounter (Signed)
Called pt to get on schedule next Tuesday 1/26 per Dr.Berry. LM2CB Monday morning

## 2019-01-31 NOTE — Telephone Encounter (Signed)
-----   Message from Runell Gess, MD sent at 01/31/2019  5:00 PM EST ----- Regarding: Return office visit Mr Greulich had an 11-second pause.  He needs return office visit to see me in the office next Tuesday to evaluate  JJB

## 2019-02-03 ENCOUNTER — Telehealth: Payer: Self-pay

## 2019-02-03 NOTE — Telephone Encounter (Signed)
Called pt - made aware of monitor results. Made appt with Dr. Allyson Sabal for 1/26 @ 10am. Verbalized understanding

## 2019-02-03 NOTE — Telephone Encounter (Signed)
-----   Message from Jonathan J Berry, MD sent at 01/31/2019  5:00 PM EST ----- Regarding: Return office visit Mr Troy French had an 11-second pause.  He needs return office visit to see me in the office next Tuesday to evaluate  JJB  

## 2019-02-04 ENCOUNTER — Ambulatory Visit: Payer: Medicare HMO | Admitting: Cardiovascular Disease

## 2019-02-04 ENCOUNTER — Encounter: Payer: Self-pay | Admitting: Cardiovascular Disease

## 2019-02-04 ENCOUNTER — Telehealth: Payer: Self-pay

## 2019-02-04 ENCOUNTER — Other Ambulatory Visit: Payer: Self-pay

## 2019-02-04 DIAGNOSIS — Z720 Tobacco use: Secondary | ICD-10-CM

## 2019-02-04 DIAGNOSIS — R002 Palpitations: Secondary | ICD-10-CM

## 2019-02-04 DIAGNOSIS — I25118 Atherosclerotic heart disease of native coronary artery with other forms of angina pectoris: Secondary | ICD-10-CM

## 2019-02-04 DIAGNOSIS — I10 Essential (primary) hypertension: Secondary | ICD-10-CM

## 2019-02-04 DIAGNOSIS — E782 Mixed hyperlipidemia: Secondary | ICD-10-CM

## 2019-02-04 MED ORDER — SODIUM CHLORIDE 0.9% FLUSH: 3.0000 mL | Freq: Two times a day (BID) | INTRAVENOUS | Status: DC

## 2019-02-04 NOTE — H&P (View-Only) (Signed)
02/04/2019 Ahmod Gillespie   08/19/1960  [id number]  Primary Physician [name]., FNP Primary Cardiologist: Runell Gess MD Milagros Loll, [place], [place]  HPI:  Troy French is a 59 y.o.  thin appearing single Caucasian male father of 2 children, grandfather of 2 grandchildren referred by Fatima Sanger, FNP to be established my practice and for cardiovascular valuation.  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  I last saw him in the office 12/17/2018. His risk factors include 50 pack years of tobacco use continue to smoke 1 pack a day recalcitrant to structure modification.  He does have history of essential hypertension hyperlipidemia.  He has COPD.  His father died at age 57 of CHF and had stents and brother has had stents as well.  He has had at least 3 myocardial infarction's dating back to the early 2000's.  He has had stents in [place], in [place] and most recently in [place] in 2015.  He does get fairly frequent atypical quick burning chest pain with occasional chest pressure.  He also complains of palpitations and shortness of breath most likely related to his COPD.  He had a 2D echo which was essentially normal and an event monitor that showed episodes of PSVT but also an 11-second pause.  He also complains of chest pain occurring several times a week.   Current Meds  Medication Sig  . aspirin 81 MG chewable tablet Chew by mouth daily.  Marland Kitchen atorvastatin (LIPITOR) 40 MG tablet Take 1 tablet (40 mg total) by mouth daily.  . clopidogrel (PLAVIX) 75 MG tablet Take 75 mg by mouth daily.  Marland Kitchen diltiazem (DILACOR XR) 240 MG 24 hr capsule Take 240 mg by mouth daily.  Marland Kitchen HYDROcodone-acetaminophen (NORCO) 10-325 MG tablet Take 1 tablet by mouth 3 (three) times daily.  Marland Kitchen lisinopril (ZESTRIL) 20 MG tablet Take 20 mg by mouth daily.  . meloxicam (MOBIC) 15 MG tablet   . morphine (MS CONTIN) 30 MG 12 hr tablet Take 30 mg by mouth every 12 (twelve) hours.  . Niacin CR 1000 MG TBCR  Take by mouth.  . SPIRIVA RESPIMAT 2.5 MCG/ACT AERS   . zolpidem (AMBIEN) 10 MG tablet      Allergies  Allergen Reactions  . Nitroglycerin Nausea And Vomiting    SWEATING    Social History   Socioeconomic History  . Marital status: Single    Spouse name: Not on file  . Number of children: Not on file  . Years of education: Not on file  . Highest education level: Not on file  Occupational History  . Not on file  Tobacco Use  . Smoking status: Current Every Day Smoker  . Smokeless tobacco: Never Used  Substance and Sexual Activity  . Alcohol use: Not Currently  . Drug use: Not on file  . Sexual activity: Not on file  Other Topics Concern  . Not on file  Social History Narrative  . Not on file   Social Determinants of Health   Financial Resource Strain:   . Difficulty of Paying Living Expenses: Not on file  Food Insecurity:   . Worried About Programme researcher, broadcasting/film/video in the Last Year: Not on file  . Ran Out of Food in the Last Year: Not on file  Transportation Needs:   . Lack of Transportation (Medical): Not on file  . Lack of Transportation (Non-Medical): Not on file  Physical Activity:   . Days of Exercise per  Week: Not on file  . Minutes of Exercise per Session: Not on file  Stress:   . Feeling of Stress : Not on file  Social Connections:   . Frequency of Communication with Friends and Family: Not on file  . Frequency of Social Gatherings with Friends and Family: Not on file  . Attends Religious Services: Not on file  . Active Member of Clubs or Organizations: Not on file  . Attends Archivist Meetings: Not on file  . Marital Status: Not on file  Intimate Partner Violence:   . Fear of Current or Ex-Partner: Not on file  . Emotionally Abused: Not on file  . Physically Abused: Not on file  . Sexually Abused: Not on file     Review of Systems: General: negative for chills, fever, night sweats or weight changes.  Cardiovascular: negative for chest pain,  dyspnea on exertion, edema, orthopnea, palpitations, paroxysmal nocturnal dyspnea or shortness of breath Dermatological: negative for rash Respiratory: negative for cough or wheezing Urologic: negative for hematuria Abdominal: negative for nausea, vomiting, diarrhea, bright red blood per rectum, melena, or hematemesis Neurologic: negative for visual changes, syncope, or dizziness All other systems reviewed and are otherwise negative except as noted above.    Blood pressure 117/74, pulse 77, temperature (!) 97.2 F (36.2 C), height 5\' 6"  (1.676 m), weight 178 lb 12.8 oz (81.1 kg), SpO2 100 %.  General appearance: alert and no distress Neck: no adenopathy, no carotid bruit, no JVD, supple, symmetrical, trachea midline and thyroid not enlarged, symmetric, no tenderness/mass/nodules Lungs: clear to auscultation bilaterally Heart: regular rate and rhythm, S1, S2 normal, no murmur, click, rub or gallop Extremities: extremities normal, atraumatic, no cyanosis or edema Pulses: 2+ and symmetric Skin: Skin color, texture, turgor normal. No rashes or lesions Neurologic: Alert and oriented X 3, normal strength and tone. Normal symmetric reflexes. Normal coordination and gait  EKG not performed today  ASSESSMENT AND PLAN:   Tobacco abuse Continue tobacco abuse recalcitrant risk factor modification.  Essential hypertension History of essential hypertension with blood pressure measured today 117/74.  He is on diltiazem and lisinopril.  Hyperlipidemia History of hyperlipidemia on statin therapy.  We will recheck a lipid liver profile.  Palpitations History of palpitations with recent event monitor that showed episodes of PSVT with an episode of complete heart block and a pause 11 seconds.  He gets short of pause as well.  He does complain of occasional presyncopal episodes.  And letter for him to EP for consideration of permanent transvenous pacemaker insertion.  Coronary artery disease History  of CAD status post multiple stents in the past (he says he had 11 stents) in different places including [place], [place] and most recently in [place].  He says that they stopped getting stress test on him at [facility] congestive heart cath when he had chest pain.  He gets episodes of chest pain several times a week. Arrange for him to undergo left heart cath via right radial approach next week.. The patient understands that risks included but are not limited to stroke (1 in 1000), death (1 in 58), kidney failure [usually temporary] (1 in 500), bleeding (1 in 200), allergic reaction [possibly serious] (1 in 200). The patient understands and agrees to proceed      Lorretta Harp MD [facility], [facility] 02/04/2019 11:08 AM

## 2019-02-04 NOTE — Assessment & Plan Note (Signed)
History of essential hypertension with blood pressure measured today 117/74.  He is on diltiazem and lisinopril.

## 2019-02-04 NOTE — Assessment & Plan Note (Signed)
History of palpitations with recent event monitor that showed episodes of PSVT with an episode of complete heart block and a pause 11 seconds.  He gets short of pause as well.  He does complain of occasional presyncopal episodes.  And letter for him to EP for consideration of permanent transvenous pacemaker insertion.

## 2019-02-04 NOTE — Assessment & Plan Note (Signed)
Continue tobacco abuse recalcitrant risk factor modification. 

## 2019-02-04 NOTE — Progress Notes (Signed)
02/04/2019 Troy French   08/19/1960  [id number]  Primary Physician [name]., FNP Primary Cardiologist: Runell Gess MD Milagros Loll, [place], [place]  HPI:  Troy French is a 59 y.o.  thin appearing single Caucasian male father of 2 children, grandfather of 2 grandchildren referred by Fatima Sanger, FNP to be established my practice and for cardiovascular valuation.  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  I last saw him in the office 12/17/2018. His risk factors include 50 pack years of tobacco use continue to smoke 1 pack a day recalcitrant to structure modification.  He does have history of essential hypertension hyperlipidemia.  He has COPD.  His father died at age 57 of CHF and had stents and brother has had stents as well.  He has had at least 3 myocardial infarction's dating back to the early 2000's.  He has had stents in [place], in [place] and most recently in [place] in 2015.  He does get fairly frequent atypical quick burning chest pain with occasional chest pressure.  He also complains of palpitations and shortness of breath most likely related to his COPD.  He had a 2D echo which was essentially normal and an event monitor that showed episodes of PSVT but also an 11-second pause.  He also complains of chest pain occurring several times a week.   Current Meds  Medication Sig  . aspirin 81 MG chewable tablet Chew by mouth daily.  Marland Kitchen atorvastatin (LIPITOR) 40 MG tablet Take 1 tablet (40 mg total) by mouth daily.  . clopidogrel (PLAVIX) 75 MG tablet Take 75 mg by mouth daily.  Marland Kitchen diltiazem (DILACOR XR) 240 MG 24 hr capsule Take 240 mg by mouth daily.  Marland Kitchen HYDROcodone-acetaminophen (NORCO) 10-325 MG tablet Take 1 tablet by mouth 3 (three) times daily.  Marland Kitchen lisinopril (ZESTRIL) 20 MG tablet Take 20 mg by mouth daily.  . meloxicam (MOBIC) 15 MG tablet   . morphine (MS CONTIN) 30 MG 12 hr tablet Take 30 mg by mouth every 12 (twelve) hours.  . Niacin CR 1000 MG TBCR  Take by mouth.  . SPIRIVA RESPIMAT 2.5 MCG/ACT AERS   . zolpidem (AMBIEN) 10 MG tablet      Allergies  Allergen Reactions  . Nitroglycerin Nausea And Vomiting    SWEATING    Social History   Socioeconomic History  . Marital status: Single    Spouse name: Not on file  . Number of children: Not on file  . Years of education: Not on file  . Highest education level: Not on file  Occupational History  . Not on file  Tobacco Use  . Smoking status: Current Every Day Smoker  . Smokeless tobacco: Never Used  Substance and Sexual Activity  . Alcohol use: Not Currently  . Drug use: Not on file  . Sexual activity: Not on file  Other Topics Concern  . Not on file  Social History Narrative  . Not on file   Social Determinants of Health   Financial Resource Strain:   . Difficulty of Paying Living Expenses: Not on file  Food Insecurity:   . Worried About Programme researcher, broadcasting/film/video in the Last Year: Not on file  . Ran Out of Food in the Last Year: Not on file  Transportation Needs:   . Lack of Transportation (Medical): Not on file  . Lack of Transportation (Non-Medical): Not on file  Physical Activity:   . Days of Exercise per  Week: Not on file  . Minutes of Exercise per Session: Not on file  Stress:   . Feeling of Stress : Not on file  Social Connections:   . Frequency of Communication with Friends and Family: Not on file  . Frequency of Social Gatherings with Friends and Family: Not on file  . Attends Religious Services: Not on file  . Active Member of Clubs or Organizations: Not on file  . Attends Club or Organization Meetings: Not on file  . Marital Status: Not on file  Intimate Partner Violence:   . Fear of Current or Ex-Partner: Not on file  . Emotionally Abused: Not on file  . Physically Abused: Not on file  . Sexually Abused: Not on file     Review of Systems: General: negative for chills, fever, night sweats or weight changes.  Cardiovascular: negative for chest pain,  dyspnea on exertion, edema, orthopnea, palpitations, paroxysmal nocturnal dyspnea or shortness of breath Dermatological: negative for rash Respiratory: negative for cough or wheezing Urologic: negative for hematuria Abdominal: negative for nausea, vomiting, diarrhea, bright red blood per rectum, melena, or hematemesis Neurologic: negative for visual changes, syncope, or dizziness All other systems reviewed and are otherwise negative except as noted above.    Blood pressure 117/74, pulse 77, temperature (!) 97.2 F (36.2 C), height 5' 6" (1.676 m), weight 178 lb 12.8 oz (81.1 kg), SpO2 100 %.  General appearance: alert and no distress Neck: no adenopathy, no carotid bruit, no JVD, supple, symmetrical, trachea midline and thyroid not enlarged, symmetric, no tenderness/mass/nodules Lungs: clear to auscultation bilaterally Heart: regular rate and rhythm, S1, S2 normal, no murmur, click, rub or gallop Extremities: extremities normal, atraumatic, no cyanosis or edema Pulses: 2+ and symmetric Skin: Skin color, texture, turgor normal. No rashes or lesions Neurologic: Alert and oriented X 3, normal strength and tone. Normal symmetric reflexes. Normal coordination and gait  EKG not performed today  ASSESSMENT AND PLAN:   Tobacco abuse Continue tobacco abuse recalcitrant risk factor modification.  Essential hypertension History of essential hypertension with blood pressure measured today 117/74.  He is on diltiazem and lisinopril.  Hyperlipidemia History of hyperlipidemia on statin therapy.  We will recheck a lipid liver profile.  Palpitations History of palpitations with recent event monitor that showed episodes of PSVT with an episode of complete heart block and a pause 11 seconds.  He gets short of pause as well.  He does complain of occasional presyncopal episodes.  And letter for him to EP for consideration of permanent transvenous pacemaker insertion.  Coronary artery disease History  of CAD status post multiple stents in the past (he says he had 11 stents) in different places including [place], [place] and most recently in [place].  He says that they stopped getting stress test on him at [facility] congestive heart cath when he had chest pain.  He gets episodes of chest pain several times a week. Arrange for him to undergo left heart cath via right radial approach next week.. The patient understands that risks included but are not limited to stroke (1 in 1000), death (1 in 1000), kidney failure [usually temporary] (1 in 500), bleeding (1 in 200), allergic reaction [possibly serious] (1 in 200). The patient understands and agrees to proceed      Baillie Mohammad J. Vishnu Moeller MD FACP,FACC,FAHA, FSCAI 02/04/2019 11:08 AM 

## 2019-02-04 NOTE — Assessment & Plan Note (Signed)
History of hyperlipidemia on statin therapy.  We will recheck a lipid liver profile 

## 2019-02-04 NOTE — Telephone Encounter (Signed)
LVM stating that pt heart cath has been pushed back on 02/10/19. He needs to show up at 0930 instead of 0730. Advised pt to call back so we know he got the message.

## 2019-02-04 NOTE — Assessment & Plan Note (Signed)
History of CAD status post multiple stents in the past (he says he had 11 stents) in different places including [place], [place] and most recently in [place].  He says that they stopped getting stress test on him at [facility] congestive heart cath when he had chest pain.  He gets episodes of chest pain several times a week. Arrange for him to undergo left heart cath via right radial approach next week.. The patient understands that risks included but are not limited to stroke (1 in 1000), death (1 in 1000), kidney failure [usually temporary] (1 in 500), bleeding (1 in 200), allergic reaction [possibly serious] (1 in 200). The patient understands and agrees to proceed

## 2019-02-04 NOTE — Patient Instructions (Addendum)
Medication Instructions:  Your physician recommends that you continue on your current medications as directed. Please refer to the Current Medication list given to you today.  If you need a refill on your cardiac medications before your next appointment, please call your pharmacy.   Lab work: CBC, BMET, Lipids, Hepatic Function If you have labs (blood work) drawn today and your tests are completely normal, you will receive your results only by: MyChart Message (if you have MyChart) OR A paper copy in the mail If you have any lab test that is abnormal or we need to change your treatment, we will call you to review the results.  Testing/Procedures: Your physician has requested that you have a cardiac catheterization on 02/10/2019. Cardiac catheterization is used to diagnose and/or treat various heart conditions. Doctors may recommend this procedure for a number of different reasons. The most common reason is to evaluate chest pain. Chest pain can be a symptom of coronary artery disease (CAD), and cardiac catheterization can show whether plaque is narrowing or blocking your heart's arteries. This procedure is also used to evaluate the valves, as well as measure the blood flow and oxygen levels in different parts of your heart. For further information please visit [email or web address][email or web address]. Please follow instruction sheet, as given.   Follow-Up: At [facility], you and your health needs are our priority.  As part of our continuing mission to provide you with exceptional heart care, we have created designated Provider Care Teams.  These Care Teams include your primary Cardiologist (physician) and Advanced Practice Providers (APPs -  Physician Assistants and Nurse Practitioners) who all work together to provide you with the care you need, when you need it. You may see Quay Burow, MD or one of the following Advanced Practice Providers on your designated Care Team:    Kerin Ransom, PA-C  [place],  [place]  Coletta Memos, [place]  You have been referred to Cardiac Electrophysiology. They will be in contact.  Any Other Special Instructions Will Be Listed Below (If Applicable).   You are scheduled for a Cardiac Catheterization on Monday, February 1 with Dr. Quay Burow.  1. Please arrive at the [facility] (Main Entrance A) at [address] [facility]: [address] [place], [place] [address] at 7:30 AM (This time is two hours before your procedure to ensure your preparation). Free valet parking service is available.   Special note: Every effort is made to have your procedure done on time. Please understand that emergencies sometimes delay scheduled procedures.  2. Diet: Do not eat solid foods after midnight.  The patient may have clear liquids until 5am upon the day of the procedure.  3. Labs: You will need to have blood drawn this week.  4. Medication instructions in preparation for your procedure:   Contrast Allergy: No  On the morning of your procedure, take your Plavix/Clopidogrel and any morning medicines NOT listed above.  You may use sips of water.  5. Plan for one night stay--bring personal belongings. 6. Bring a current list of your medications and current insurance cards. 7. You MUST have a responsible person to drive you home. 8. Someone MUST be with you the first 24 hours after you arrive home or your discharge will be delayed. 9. Please wear clothes that are easy to get on and off and wear slip-on shoes.  You have to be tested for covid prior to this procedure. This will be scheduled on Thursday January 28th @ 12:45pm. This is a Drive  Up Visit at the [facility] [address] [place]. Someone will direct you to the appropriate testing line. Stay in your car and someone will be with you shortly.  Thank you for allowing [place] to care for you!   -- [facility]

## 2019-02-05 NOTE — Telephone Encounter (Signed)
Patient returning call to confirm his appointment time of 9:30 instead of 7:30 on 2/1.

## 2019-02-06 ENCOUNTER — Telehealth: Payer: Self-pay | Admitting: Cardiovascular Disease

## 2019-02-06 ENCOUNTER — Telehealth: Payer: Self-pay | Admitting: *Deleted

## 2019-02-06 ENCOUNTER — Other Ambulatory Visit (HOSPITAL_COMMUNITY)
Admission: RE | Admit: 2019-02-06 | Discharge: 2019-02-06 | Disposition: A | Discharge status: Home / Self Care | Payer: Medicare HMO | Source: Ambulatory Visit | Attending: Cardiovascular Disease | Admitting: Cardiovascular Disease

## 2019-02-06 DIAGNOSIS — Z20822 Contact with and (suspected) exposure to covid-19: Secondary | ICD-10-CM | POA: Insufficient documentation

## 2019-02-06 DIAGNOSIS — Z01812 Encounter for preprocedural laboratory examination: Secondary | ICD-10-CM | POA: Insufficient documentation

## 2019-02-06 LAB — SARS CORONAVIRUS 2 (TAT 6-24 HRS): SARS Coronavirus 2: NEGATIVE

## 2019-02-06 NOTE — Telephone Encounter (Signed)
Called patient, I did advise with him if possible to keep the appointment for today- because they like to give 2-3 days before the CATH appointment on Monday- and he is a morning appointment, patient was concerned that he would not be able to have his blood work completed, but I advised with him if he had his covid test today- they would allow him to exit his quarantine to have the blood work completed for the procedure.  Patient verbalized understanding, and thanked me for the call.

## 2019-02-06 NOTE — Telephone Encounter (Signed)
Patient calling stating he would like to speak to [facility] about rescheduling his covid test to tomorrow.

## 2019-02-06 NOTE — Telephone Encounter (Signed)
Pt contacted pre-catheterization scheduled at [facility] LNL:[id number] February 10, 2019 11:30 AM Verified arrival time and place: [facility] Main Entrance A [facility]) at: 9:30 AM   No solid food after midnight prior to cath, clear liquids until 5 AM day of procedure. Contrast allergy: no  AM meds can be  taken pre-cath with sip of water including: ASA 81 mg Plavix 75mg    Confirmed patient has responsible adult to drive home post procedure and observe 24 hours after arriving home:   Currently, due to Covid-19 pandemic, only one person will be allowed with patient. Must be the same person for patient's entire stay and will be required to wear a mask. They will be asked to wait in the waiting room for the duration of the patient's stay.  Patients are required to wear a mask when they enter the hospital.  [facility] to review procedure instructions with patient.

## 2019-02-07 LAB — BASIC METABOLIC PANEL
BUN/Creatinine Ratio: 11 (ref 9–20)
BUN: 10 mg/dL (ref 6–24)
CO2: 20 mmol/L (ref 20–29)
Calcium: 8.9 mg/dL (ref 8.7–10.2)
Chloride: 105 mmol/L (ref 96–106)
Creatinine, Ser: 0.87 mg/dL (ref 0.76–1.27)
GFR calc Af Amer: 109 mL/min/{1.73_m2} (ref >59)
GFR calc non Af Amer: 94 mL/min/{1.73_m2} (ref >59)
Glucose: 88 mg/dL (ref 65–99)
Potassium: 5.2 mmol/L (ref 3.5–5.2)
Sodium: 139 mmol/L (ref 134–144)

## 2019-02-07 LAB — HEPATIC FUNCTION PANEL
ALT: 9 IU/L (ref 0–44)
AST: 9 IU/L (ref 0–40)
Albumin: 3.9 g/dL (ref 3.8–4.9)
Alkaline Phosphatase: 75 IU/L (ref 39–117)
Bilirubin Total: 0.3 mg/dL (ref 0.0–1.2)
Bilirubin, Direct: 0.1 mg/dL (ref 0.00–0.40)
Total Protein: 6 g/dL (ref 6.0–8.5)

## 2019-02-07 LAB — LIPID PANEL
Chol/HDL Ratio: 3.5 ratio (ref 0.0–5.0)
Cholesterol, Total: 132 mg/dL (ref 100–199)
HDL: 38 mg/dL — ABNORMAL LOW (ref >39)
LDL Chol Calc (NIH): 71 mg/dL (ref 0–99)
Triglycerides: 126 mg/dL (ref 0–149)
VLDL Cholesterol Cal: 23 mg/dL (ref 5–40)

## 2019-02-07 LAB — CBC
Hematocrit: 42.2 % (ref 37.5–51.0)
Hemoglobin: 13.9 g/dL (ref 13.0–17.7)
MCH: 30.8 pg (ref 26.6–33.0)
MCHC: 32.9 g/dL (ref 31.5–35.7)
MCV: 93 fL (ref 79–97)
Platelets: 272 10*3/uL (ref 150–450)
RBC: 4.52 x10E6/uL (ref 4.14–5.80)
RDW: 12.6 % (ref 11.6–15.4)
WBC: 7.7 10*3/uL (ref 3.4–10.8)

## 2019-02-10 ENCOUNTER — Ambulatory Visit (HOSPITAL_COMMUNITY)
Admission: RE | Admit: 2019-02-10 | Discharge: 2019-02-11 | Disposition: A | Discharge status: Home / Self Care | Payer: Medicare HMO | Attending: Cardiovascular Disease | Admitting: Cardiovascular Disease

## 2019-02-10 ENCOUNTER — Other Ambulatory Visit: Payer: Self-pay

## 2019-02-10 ENCOUNTER — Ambulatory Visit (HOSPITAL_COMMUNITY)
Admission: RE | Disposition: A | Discharge status: Home / Self Care | Payer: Medicare HMO | Source: Home / Self Care | Attending: Cardiovascular Disease

## 2019-02-10 ENCOUNTER — Encounter (HOSPITAL_COMMUNITY): Payer: Self-pay | Admitting: Cardiovascular Disease

## 2019-02-10 DIAGNOSIS — Z888 Allergy status to other drugs, medicaments and biological substances status: Secondary | ICD-10-CM | POA: Insufficient documentation

## 2019-02-10 DIAGNOSIS — I110 Hypertensive heart disease with heart failure: Secondary | ICD-10-CM | POA: Insufficient documentation

## 2019-02-10 DIAGNOSIS — I509 Heart failure, unspecified: Secondary | ICD-10-CM | POA: Insufficient documentation

## 2019-02-10 DIAGNOSIS — Z791 Long term (current) use of non-steroidal anti-inflammatories (NSAID): Secondary | ICD-10-CM | POA: Insufficient documentation

## 2019-02-10 DIAGNOSIS — E785 Hyperlipidemia, unspecified: Secondary | ICD-10-CM | POA: Diagnosis present

## 2019-02-10 DIAGNOSIS — Z79899 Other long term (current) drug therapy: Secondary | ICD-10-CM | POA: Diagnosis not present

## 2019-02-10 DIAGNOSIS — Z7902 Long term (current) use of antithrombotics/antiplatelets: Secondary | ICD-10-CM | POA: Diagnosis not present

## 2019-02-10 DIAGNOSIS — I471 Supraventricular tachycardia: Secondary | ICD-10-CM | POA: Diagnosis not present

## 2019-02-10 DIAGNOSIS — I442 Atrioventricular block, complete: Secondary | ICD-10-CM | POA: Diagnosis not present

## 2019-02-10 DIAGNOSIS — F17200 Nicotine dependence, unspecified, uncomplicated: Secondary | ICD-10-CM | POA: Diagnosis not present

## 2019-02-10 DIAGNOSIS — R002 Palpitations: Secondary | ICD-10-CM

## 2019-02-10 DIAGNOSIS — M1990 Unspecified osteoarthritis, unspecified site: Secondary | ICD-10-CM | POA: Insufficient documentation

## 2019-02-10 DIAGNOSIS — Z7982 Long term (current) use of aspirin: Secondary | ICD-10-CM | POA: Insufficient documentation

## 2019-02-10 DIAGNOSIS — I252 Old myocardial infarction: Secondary | ICD-10-CM | POA: Insufficient documentation

## 2019-02-10 DIAGNOSIS — I25110 Atherosclerotic heart disease of native coronary artery with unstable angina pectoris: Secondary | ICD-10-CM

## 2019-02-10 DIAGNOSIS — I2510 Atherosclerotic heart disease of native coronary artery without angina pectoris: Secondary | ICD-10-CM | POA: Diagnosis present

## 2019-02-10 DIAGNOSIS — Z8249 Family history of ischemic heart disease and other diseases of the circulatory system: Secondary | ICD-10-CM | POA: Diagnosis not present

## 2019-02-10 DIAGNOSIS — I455 Other specified heart block: Secondary | ICD-10-CM | POA: Diagnosis not present

## 2019-02-10 DIAGNOSIS — I25118 Atherosclerotic heart disease of native coronary artery with other forms of angina pectoris: Secondary | ICD-10-CM

## 2019-02-10 DIAGNOSIS — J449 Chronic obstructive pulmonary disease, unspecified: Secondary | ICD-10-CM | POA: Diagnosis not present

## 2019-02-10 DIAGNOSIS — I10 Essential (primary) hypertension: Secondary | ICD-10-CM | POA: Diagnosis present

## 2019-02-10 DIAGNOSIS — Z955 Presence of coronary angioplasty implant and graft: Secondary | ICD-10-CM | POA: Insufficient documentation

## 2019-02-10 DIAGNOSIS — I495 Sick sinus syndrome: Secondary | ICD-10-CM | POA: Insufficient documentation

## 2019-02-10 DIAGNOSIS — G4733 Obstructive sleep apnea (adult) (pediatric): Secondary | ICD-10-CM | POA: Diagnosis not present

## 2019-02-10 DIAGNOSIS — Z720 Tobacco use: Secondary | ICD-10-CM

## 2019-02-10 DIAGNOSIS — E782 Mixed hyperlipidemia: Secondary | ICD-10-CM

## 2019-02-10 DIAGNOSIS — I2584 Coronary atherosclerosis due to calcified coronary lesion: Secondary | ICD-10-CM | POA: Diagnosis not present

## 2019-02-10 HISTORY — DX: Hyperlipidemia, unspecified: E78.5

## 2019-02-10 HISTORY — PX: CORONARY ATHERECTOMY: CATH118238

## 2019-02-10 HISTORY — PX: CORONARY STENT INTERVENTION: CATH118234

## 2019-02-10 HISTORY — PX: LEFT HEART CATH AND CORONARY ANGIOGRAPHY: CATH118249

## 2019-02-10 HISTORY — PX: TEMPORARY PACEMAKER: CATH118268

## 2019-02-10 LAB — POCT ACTIVATED CLOTTING TIME
Activated Clotting Time: 268 seconds
Activated Clotting Time: 296 seconds
Activated Clotting Time: 257 seconds
Activated Clotting Time: 197 seconds
Activated Clotting Time: 158 seconds

## 2019-02-10 SURGERY — LEFT HEART CATH AND CORONARY ANGIOGRAPHY
Anesthesia: LOCAL

## 2019-02-10 MED ORDER — HEPARIN SODIUM (PORCINE) 1000 UNIT/ML IJ SOLN
INTRAMUSCULAR | Status: AC
  Filled 2019-02-10: qty 1

## 2019-02-10 MED ORDER — MORPHINE SULFATE (PF) 2 MG/ML IV SOLN
INTRAVENOUS | Status: AC
  Filled 2019-02-10: qty 1

## 2019-02-10 MED ORDER — TIOTROPIUM BROMIDE MONOHYDRATE 2.5 MCG/ACT IN AERS: 2.0000 | INHALATION_SPRAY | Freq: Every day | RESPIRATORY_TRACT | Status: DC

## 2019-02-10 MED ORDER — ONDANSETRON HCL 4 MG/2ML IJ SOLN
INTRAMUSCULAR | Status: DC | PRN
  Administered 2019-02-10: 4 mg via INTRAVENOUS

## 2019-02-10 MED ORDER — SODIUM CHLORIDE 0.9 % IV SOLN: 250.0000 mL | INTRAVENOUS | Status: DC | PRN

## 2019-02-10 MED ORDER — ATORVASTATIN CALCIUM 40 MG PO TABS: 40.0000 mg | ORAL_TABLET | Freq: Every day | ORAL | Status: DC

## 2019-02-10 MED ORDER — HEPARIN (PORCINE) IN NACL 1000-0.9 UT/500ML-% IV SOLN
INTRAVENOUS | Status: DC | PRN
  Administered 2019-02-10: 500 mL

## 2019-02-10 MED ORDER — HEPARIN (PORCINE) IN NACL 1000-0.9 UT/500ML-% IV SOLN
INTRAVENOUS | Status: AC
  Filled 2019-02-10: qty 500

## 2019-02-10 MED ORDER — ONDANSETRON HCL 4 MG/2ML IJ SOLN: 4.0000 mg | Freq: Four times a day (QID) | INTRAMUSCULAR | Status: DC | PRN

## 2019-02-10 MED ORDER — CLOPIDOGREL BISULFATE 300 MG PO TABS
ORAL_TABLET | ORAL | Status: AC
  Filled 2019-02-10: qty 1

## 2019-02-10 MED ORDER — MIDAZOLAM HCL 2 MG/2ML IJ SOLN
INTRAMUSCULAR | Status: AC
  Filled 2019-02-10: qty 2

## 2019-02-10 MED ORDER — LIDOCAINE HCL (PF) 1 % IJ SOLN
INTRAMUSCULAR | Status: AC
  Filled 2019-02-10: qty 30

## 2019-02-10 MED ORDER — IOHEXOL 350 MG/ML SOLN
INTRAVENOUS | Status: DC | PRN
  Administered 2019-02-10: 100 mL

## 2019-02-10 MED ORDER — ONDANSETRON HCL 4 MG/2ML IJ SOLN
INTRAMUSCULAR | Status: AC
  Filled 2019-02-10: qty 2

## 2019-02-10 MED ORDER — ZOLPIDEM TARTRATE 5 MG PO TABS
10.0000 mg | ORAL_TABLET | Freq: Every day | ORAL | Status: DC
  Administered 2019-02-10: 22:00:00 10 mg via ORAL
  Filled 2019-02-10: qty 2

## 2019-02-10 MED ORDER — NICOTINE 21 MG/24HR TD PT24
21.0000 mg | MEDICATED_PATCH | Freq: Every day | TRANSDERMAL | Status: DC
  Administered 2019-02-10: 20:00:00 21 mg via TRANSDERMAL
  Filled 2019-02-10: qty 1

## 2019-02-10 MED ORDER — FENTANYL CITRATE (PF) 100 MCG/2ML IJ SOLN
INTRAMUSCULAR | Status: AC
  Filled 2019-02-10: qty 2

## 2019-02-10 MED ORDER — ANGIOPLASTY BOOK
Freq: Once | Status: AC
  Filled 2019-02-10: qty 1

## 2019-02-10 MED ORDER — ASPIRIN 81 MG PO CHEW: 81.0000 mg | CHEWABLE_TABLET | Freq: Every day | ORAL | Status: DC

## 2019-02-10 MED ORDER — FAMOTIDINE IN NACL 20-0.9 MG/50ML-% IV SOLN
INTRAVENOUS | Status: AC | PRN
  Administered 2019-02-10: 20 mg via INTRAVENOUS

## 2019-02-10 MED ORDER — FAMOTIDINE IN NACL 20-0.9 MG/50ML-% IV SOLN
INTRAVENOUS | Status: AC
  Filled 2019-02-10: qty 50

## 2019-02-10 MED ORDER — ASPIRIN 81 MG PO CHEW
81.0000 mg | CHEWABLE_TABLET | ORAL | Status: DC
  Filled 2019-02-10: qty 1

## 2019-02-10 MED ORDER — DILTIAZEM HCL ER COATED BEADS 240 MG PO CP24
240.0000 mg | ORAL_CAPSULE | Freq: Every day | ORAL | Status: DC
  Filled 2019-02-10: qty 1

## 2019-02-10 MED ORDER — ALBUTEROL SULFATE HFA 108 (90 BASE) MCG/ACT IN AERS: 2.0000 | INHALATION_SPRAY | Freq: Four times a day (QID) | RESPIRATORY_TRACT | Status: DC | PRN

## 2019-02-10 MED ORDER — HEPARIN SODIUM (PORCINE) 1000 UNIT/ML IJ SOLN
INTRAMUSCULAR | Status: DC | PRN
  Administered 2019-02-10: 8000 [IU] via INTRAVENOUS

## 2019-02-10 MED ORDER — SODIUM CHLORIDE 0.9% FLUSH: 3.0000 mL | INTRAVENOUS | Status: DC | PRN

## 2019-02-10 MED ORDER — HEPARIN SODIUM (PORCINE) 1000 UNIT/ML IJ SOLN
INTRAMUSCULAR | Status: DC | PRN
  Administered 2019-02-10: 3000 [IU] via INTRAVENOUS

## 2019-02-10 MED ORDER — PANTOPRAZOLE SODIUM 40 MG PO TBEC: 40.0000 mg | DELAYED_RELEASE_TABLET | Freq: Every day | ORAL | Status: DC

## 2019-02-10 MED ORDER — ATORVASTATIN CALCIUM 80 MG PO TABS
80.0000 mg | ORAL_TABLET | Freq: Every day | ORAL | Status: DC
  Administered 2019-02-10: 80 mg via ORAL
  Filled 2019-02-10: qty 1

## 2019-02-10 MED ORDER — ALBUTEROL SULFATE (2.5 MG/3ML) 0.083% IN NEBU: 2.5000 mg | INHALATION_SOLUTION | Freq: Four times a day (QID) | RESPIRATORY_TRACT | Status: DC | PRN

## 2019-02-10 MED ORDER — CLOPIDOGREL BISULFATE 75 MG PO TABS
75.0000 mg | ORAL_TABLET | Freq: Every day | ORAL | Status: DC
  Administered 2019-02-11: 07:00:00 75 mg via ORAL
  Filled 2019-02-10: qty 1

## 2019-02-10 MED ORDER — ACETAMINOPHEN 325 MG PO TABS: 650.0000 mg | ORAL_TABLET | ORAL | Status: DC | PRN

## 2019-02-10 MED ORDER — LISINOPRIL 20 MG PO TABS: 20.0000 mg | ORAL_TABLET | Freq: Every day | ORAL | Status: DC

## 2019-02-10 MED ORDER — HYDROCODONE-ACETAMINOPHEN 10-325 MG PO TABS
1.0000 | ORAL_TABLET | Freq: Three times a day (TID) | ORAL | Status: DC
  Administered 2019-02-10 – 2019-02-11 (×3): 1 via ORAL
  Filled 2019-02-10 (×3): qty 1

## 2019-02-10 MED ORDER — SODIUM CHLORIDE 0.9% FLUSH
3.0000 mL | Freq: Two times a day (BID) | INTRAVENOUS | Status: DC
  Administered 2019-02-10: 18:00:00 3 mL via INTRAVENOUS

## 2019-02-10 MED ORDER — HYDRALAZINE HCL 20 MG/ML IJ SOLN: 10.0000 mg | INTRAMUSCULAR | Status: AC | PRN

## 2019-02-10 MED ORDER — CLOPIDOGREL BISULFATE 300 MG PO TABS
ORAL_TABLET | ORAL | Status: DC | PRN
  Administered 2019-02-10: 300 mg via ORAL

## 2019-02-10 MED ORDER — ASPIRIN 81 MG PO CHEW
81.0000 mg | CHEWABLE_TABLET | ORAL | Status: AC
  Administered 2019-02-10: 10:00:00 81 mg via ORAL

## 2019-02-10 MED ORDER — SODIUM CHLORIDE 0.9 % WEIGHT BASED INFUSION: 1.0000 mL/kg/h | INTRAVENOUS | Status: DC

## 2019-02-10 MED ORDER — SODIUM CHLORIDE 0.9 % IV SOLN: INTRAVENOUS | Status: AC

## 2019-02-10 MED ORDER — UMECLIDINIUM BROMIDE 62.5 MCG/INH IN AEPB
1.0000 | INHALATION_SPRAY | Freq: Every day | RESPIRATORY_TRACT | Status: DC
  Administered 2019-02-11: 1 via RESPIRATORY_TRACT
  Filled 2019-02-10: qty 7

## 2019-02-10 MED ORDER — MORPHINE SULFATE (PF) 2 MG/ML IV SOLN
2.0000 mg | INTRAVENOUS | Status: DC | PRN
  Administered 2019-02-10 – 2019-02-11 (×3): 2 mg via INTRAVENOUS
  Filled 2019-02-10 (×2): qty 1

## 2019-02-10 MED ORDER — VERAPAMIL HCL 2.5 MG/ML IV SOLN
INTRAVENOUS | Status: AC
  Filled 2019-02-10: qty 2

## 2019-02-10 MED ORDER — ALPRAZOLAM 0.5 MG PO TABS
0.5000 mg | ORAL_TABLET | Freq: Three times a day (TID) | ORAL | Status: DC
  Administered 2019-02-10 (×2): 0.5 mg via ORAL
  Filled 2019-02-10 (×2): qty 1

## 2019-02-10 MED ORDER — CLOPIDOGREL BISULFATE 75 MG PO TABS: 75.0000 mg | ORAL_TABLET | Freq: Every day | ORAL | Status: DC

## 2019-02-10 MED ORDER — MIDAZOLAM HCL 2 MG/2ML IJ SOLN
INTRAMUSCULAR | Status: DC | PRN
  Administered 2019-02-10 (×4): 1 mg via INTRAVENOUS

## 2019-02-10 MED ORDER — FENTANYL CITRATE (PF) 100 MCG/2ML IJ SOLN
INTRAMUSCULAR | Status: DC | PRN
  Administered 2019-02-10 (×4): 25 ug via INTRAVENOUS

## 2019-02-10 MED ORDER — THE SENSUOUS HEART BOOK
Freq: Once | Status: AC
  Filled 2019-02-10: qty 1

## 2019-02-10 MED ORDER — SODIUM CHLORIDE 0.9 % WEIGHT BASED INFUSION
3.0000 mL/kg/h | INTRAVENOUS | Status: DC
  Administered 2019-02-10: 10:00:00 3 mL/kg/h via INTRAVENOUS

## 2019-02-10 MED ORDER — LABETALOL HCL 5 MG/ML IV SOLN: 10.0000 mg | INTRAVENOUS | Status: AC | PRN

## 2019-02-10 MED ORDER — LIDOCAINE HCL (PF) 1 % IJ SOLN
INTRAMUSCULAR | Status: DC | PRN
  Administered 2019-02-10: 15 mL
  Administered 2019-02-10: 10 mL
  Administered 2019-02-10: 3 mL

## 2019-02-10 SURGICAL SUPPLY — 30 items
BALLN SAPPHIRE 2.0X12 (BALLOONS) ×2
BALLN SAPPHIRE ~~LOC~~ 2.75X15 (BALLOONS) ×2 IMPLANT
BALLN SAPPHIRE ~~LOC~~ 3.0X15 (BALLOONS) ×2 IMPLANT
BALLOON SAPPHIRE 2.0X12 (BALLOONS) ×1 IMPLANT
CABLE ADAPT PACING TEMP 12FT (ADAPTER) ×2 IMPLANT
CATH DXT MULTI JL4 JR4 ANG PIG (CATHETERS) ×2 IMPLANT
CATH LAUNCHER 6FR AL.75 (CATHETERS) ×2 IMPLANT
CATH LAUNCHER 6FR JR4 (CATHETERS) ×2 IMPLANT
CATH S G BIP PACING (CATHETERS) ×2 IMPLANT
CATH TELEPORT (CATHETERS) ×2 IMPLANT
CROWN DIAMONDBACK CLASSIC 1.25 (BURR) ×2 IMPLANT
DEVICE CONTINUOUS FLUSH (MISCELLANEOUS) ×2 IMPLANT
GLIDESHEATH SLEND A-KIT 6F 22G (SHEATH) ×2 IMPLANT
KIT ENCORE 26 ADVANTAGE (KITS) ×2 IMPLANT
KIT HEART LEFT (KITS) ×2 IMPLANT
LUBRICANT VIPERSLIDE CORONARY (MISCELLANEOUS) ×2 IMPLANT
PACK CARDIAC CATHETERIZATION (CUSTOM PROCEDURE TRAY) ×2 IMPLANT
PINNACLE LONG 6F 25CM (SHEATH) ×2
SHEATH INTRO PINNACLE 6F 25CM (SHEATH) ×1 IMPLANT
SHEATH PINNACLE 5F 10CM (SHEATH) ×2 IMPLANT
SHEATH PINNACLE 6F 10CM (SHEATH) ×2 IMPLANT
SLEEVE REPOSITIONING LENGTH 30 (MISCELLANEOUS) ×2 IMPLANT
STENT SYNERGY XD 2.50X28 (Permanent Stent) ×1 IMPLANT
SYNERGY XD 2.50X28 (Permanent Stent) ×2 IMPLANT
SYR MEDRAD MARK 7 150ML (SYRINGE) ×2 IMPLANT
TRANSDUCER W/STOPCOCK (MISCELLANEOUS) ×2 IMPLANT
TUBING CIL FLEX 10 FLL-RA (TUBING) ×2 IMPLANT
WIRE ASAHI PROWATER 300CM (WIRE) ×2 IMPLANT
WIRE EMERALD 3MM-J .035X150CM (WIRE) ×2 IMPLANT
WIRE VIPERWIRE COR FLEX .012 (WIRE) ×2 IMPLANT

## 2019-02-10 NOTE — Interval H&P Note (Signed)
Cath Lab Visit (complete for each Cath Lab visit)  Clinical Evaluation Leading to the Procedure:   ACS: No.  Non-ACS:    Anginal Classification: CCS II  Anti-ischemic medical therapy: Minimal Therapy (1 class of medications)  Non-Invasive Test Results: No non-invasive testing performed  Prior CABG: No previous CABG      History and Physical Interval Note:  02/10/2019 12:02 PM  Troy French  has presented today for surgery, with the diagnosis of chest pain - cad.  The various methods of treatment have been discussed with the patient and family. After consideration of risks, benefits and other options for treatment, the patient has consented to  Procedure(s): LEFT HEART CATH AND CORONARY ANGIOGRAPHY (N/A) as a surgical intervention.  The patient's history has been reviewed, patient examined, no change in status, stable for surgery.  I have reviewed the patient's chart and labs.  Questions were answered to the patient's satisfaction.     Nanetta Batty

## 2019-02-10 NOTE — Progress Notes (Signed)
Pt arrived to unit from cardiac cath lab. Report received from RN. Pt on strict bedrest until 2230. VSS. Right femoral site is clean, dry and intact with no sign of bleeding or hematoma noted. Resting in bed with call light within reach. Will cont to mx closely.

## 2019-02-10 NOTE — Consult Note (Addendum)
Cardiology Consultation:   Patient ID: Troy French MRN: [id number]; DOB: [birth year]  Admit date: 02/10/2019 Date of Consult: 02/10/2019  Primary Care Provider: [name]., FNP Primary Cardiologist: Nanetta Batty, MD  Primary Electrophysiologist:  None    Patient Profile:   Troy French is a 59 y.o. male with a hx of HTN, COPD (50 years of smoking continues), HLD, CAD w/hx of 3 prior MIs and prior stents who is being seen today for the evaluation of pause on an outpatient monitor at the request of Dr. Allyson Sabal.  History of Present Illness:   Troy French last saw Dr. Allyson Sabal 02/04/2019, he had been having some CP /occassional pressure.  Also mentioned palpitations and some SOB suspect to be his COPD He had worn an event monitor with report of CHB associated with 11 second pause and an episode of SVT.  Also had an echo with normal LVEF.  He was planned to be referred to EP given some mentions of h/o pre-syncope, and cath for his CP.  He was brought in today for his cath, noting   Prox Cx to Mid Cx lesion is 100% stenosed.  Ost Cx to Prox Cx lesion is 80% stenosed.  Previously placed Mid RCA to Dist RCA stent (unknown type) is widely patent.  Prox RCA lesion is 80% stenosed.  A drug-eluting stent was successfully placed using a SYNERGY XD 2.50X28.  Post intervention, there is a 0% residual stenosis.  The left ventricular systolic function is normal.  LV end diastolic pressure is normal.  The left ventricular ejection fraction is 50-55% by visual estimate. IMPRESSION: Successful highly calcified proximal RCA CSI and drug-eluting stenting.  He does have an occluded stent in the mid nondominant circumflex with high-grade proximal disease making it not amenable to intervention.  This will be treated medically.  He has normal LV function.  This easily removed and pressure held.  Patient be hydrated overnight.  He left lab in stable condition.  He can be discharged home in the morning on  dual antiplatelet therapy including aspirin Plavix uninterrupted for at least 12 months.  EP is asked to see the patient while here to address his pause on out patient monitor.  The pateint reports upon standing fast he has felt fleetingly lightheaded, may have had moments of lightheadedness when standing, never when seated or supine. These are not associated with CP or SOB, no N/V/D.    He reports + snoring and known sleep apnea though does not use his CPAP    Heart Pathway Score:     Past Medical History:  Diagnosis Date  . Arthritis   . CHF (congestive heart failure) (HCC)   . COPD (chronic obstructive pulmonary disease) (HCC)   . Coronary artery disease   . Hypertension     Past Surgical History:  Procedure Laterality Date  . cardiac stents       Home Medications:  Prior to Admission medications   Medication Sig Start Date End Date Taking? Authorizing Provider  albuterol (VENTOLIN HFA) 108 (90 Base) MCG/ACT inhaler Inhale 2 puffs into the lungs every 6 (six) hours as needed for wheezing or shortness of breath.   Yes [provider]  ALPRAZolam Prudy Feeler) 0.5 MG tablet Take 0.5 mg by mouth 3 (three) times daily.   Yes [provider]  aspirin 81 MG chewable tablet Chew 81 mg by mouth daily.    Yes [provider]  atorvastatin (LIPITOR) 40 MG tablet Take 1 tablet (40 mg  total) by mouth daily. Patient taking differently: Take 40 mg by mouth every evening.  12/17/18 03/17/19 Yes Lorretta Harp, MD  clopidogrel (PLAVIX) 75 MG tablet Take 75 mg by mouth daily.   Yes [provider]  diltiazem (DILACOR XR) 240 MG 24 hr capsule Take 240 mg by mouth daily.   Yes [provider]  HYDROcodone-acetaminophen (NORCO) 10-325 MG tablet Take 1 tablet by mouth 3 (three) times daily.   Yes [provider]  lisinopril (ZESTRIL) 20 MG tablet Take 20 mg by mouth daily.   Yes [provider]  meloxicam (MOBIC) 15 MG tablet Take 15 mg by  mouth daily.  01/15/19  Yes [provider]  morphine (MS CONTIN) 30 MG 12 hr tablet Take 30 mg by mouth every 12 (twelve) hours.   Yes [provider]  omeprazole (PRILOSEC) 40 MG capsule Take 40 mg by mouth daily.   Yes [provider]  phenylephrine (SUDAFED PE) 10 MG TABS tablet Take 10 mg by mouth daily as needed (allergies).   Yes [provider]  sodium chloride (OCEAN) 0.65 % SOLN nasal spray Place 1 spray into both nostrils as needed for congestion (dryness).   Yes [provider]  SPIRIVA RESPIMAT 2.5 MCG/ACT AERS Inhale 2 puffs into the lungs daily.  01/13/19  Yes [provider]  tadalafil (CIALIS) 5 MG tablet Take 5 mg by mouth daily.   Yes [provider]  zolpidem (AMBIEN) 10 MG tablet Take 10 mg by mouth at bedtime.  01/27/19  Yes [provider]  terbinafine (LAMISIL) 1 % cream Apply 1 application topically daily as needed (toenail fungus).    [provider]    Inpatient Medications: Scheduled Meds:  Continuous Infusions: . sodium chloride    . sodium chloride 1 mL/kg/hr (02/10/19 1124)   PRN Meds: sodium chloride, morphine injection, sodium chloride flush  Allergies:    Allergies  Allergen Reactions  . Nitroglycerin Nausea And Vomiting    SWEATING    Social History:   Social History   Socioeconomic History  . Marital status: Single    Spouse name: Not on file  . Number of children: Not on file  . Years of education: Not on file  . Highest education level: Not on file  Occupational History  . Not on file  Tobacco Use  . Smoking status: Current Every Day Smoker  . Smokeless tobacco: Never Used  Substance and Sexual Activity  . Alcohol use: Not Currently  . Drug use: Not on file  . Sexual activity: Not on file  Other Topics Concern  . Not on file  Social History Narrative  . Not on file   Social Determinants of Health   Financial Resource Strain:   . Difficulty of Paying  Living Expenses: Not on file  Food Insecurity:   . Worried About Charity fundraiser in the Last Year: Not on file  . Ran Out of Food in the Last Year: Not on file  Transportation Needs:   . Lack of Transportation (Medical): Not on file  . Lack of Transportation (Non-Medical): Not on file  Physical Activity:   . Days of Exercise per Week: Not on file  . Minutes of Exercise per Session: Not on file  Stress:   . Feeling of Stress : Not on file  Social Connections:   . Frequency of Communication with Friends and Family: Not on file  . Frequency of Social Gatherings with Friends and Family:  Not on file  . Attends Religious Services: Not on file  . Active Member of Clubs or Organizations: Not on file  . Attends BankerClub or Organization Meetings: Not on file  . Marital Status: Not on file  Intimate Partner Violence:   . Fear of Current or Ex-Partner: Not on file  . Emotionally Abused: Not on file  . Physically Abused: Not on file  . Sexually Abused: Not on file    Family History:   Family History  Problem Relation Age of Onset  . Diabetes Mother      ROS:  Please see the history of present illness.   All other ROS reviewed and negative.     Physical Exam/Data:   Vitals:   02/10/19 1037 02/10/19 1204  BP: 124/63   Pulse: 71   Resp: 17   Temp: 97.9 F (36.6 C)   TempSrc: Oral   SpO2: 98% 100%  Weight: 79.4 kg   Height: 5\' 7"  (1.702 m)    No intake or output data in the 24 hours ending 02/10/19 1512 Last 3 Weights 02/10/2019 02/04/2019 12/17/2018  Weight (lbs) 175 lb 178 lb 12.8 oz 176 lb  Weight (kg) 79.379 kg 81.103 kg 79.833 kg     Body mass index is 27.41 kg/m.  General:  Well nourished, well developed, in no acute distress HEENT: normal Lymph: no adenopathy Neck: no JVD Endocrine:  No thryomegaly Vascular: No carotid bruits, Sheath R groin Cardiac: RRR; no murmurs, gallops or rubs Lungs:  CTA b/l, no wheezing, rhonchi or rales  Abd: soft, nontender Ext: no  edema Musculoskeletal:  No deformities Skin: warm and dry  Neuro:  No gross focal abnormalities noted Psych:  Normal affect   EKG:  The EKG was personally reviewed and demonstrates:    12/17/2018: SR, 80bpm, normal intervals, nonspecific T flattening   Telemetry:  Telemetry was personally reviewed and demonstrates:   SR 60's currently  Relevant CV Studies:   02/10/2019: LHC/PCI  Prox Cx to Mid Cx lesion is 100% stenosed.  Ost Cx to Prox Cx lesion is 80% stenosed.  Previously placed Mid RCA to Dist RCA stent (unknown type) is widely patent.  Prox RCA lesion is 80% stenosed.  A drug-eluting stent was successfully placed using a SYNERGY XD 2.50X28.  Post intervention, there is a 0% residual stenosis.  The left ventricular systolic function is normal.  LV end diastolic pressure is normal.  The left ventricular ejection fraction is 50-55% by visual estimate.  01/24/2019: Event monitor 1: Sinus rhythm/sinus bradycardia/sinus tachycardia 2: Episodes of SVT with PACs 3: Episodes of complete heart block with pauses as long as 11 seconds 4: Needs return office visit next Tuesday to discuss   12/27/2018: TTE IMPRESSIONS  1. Left ventricular ejection fraction, by visual estimation, is 60 to  65%. The left ventricle has normal function. There is no left ventricular  hypertrophy.  2. The left ventricle has no regional wall motion abnormalities.  3. Global right ventricle has normal systolic function.The right  ventricular size is normal.  4. Left atrial size was normal.  5. Right atrial size was normal.  6. The mitral valve is normal in structure. Trivial mitral valve  regurgitation. No evidence of mitral stenosis.  7. The tricuspid valve is normal in structure. Tricuspid valve  regurgitation is trivial.  8. The aortic valve is tricuspid. Aortic valve regurgitation is not  visualized. Mild aortic valve sclerosis without stenosis.  9. The pulmonic valve was normal in  structure.  Pulmonic valve  regurgitation is not visualized.  10. The inferior vena cava is normal in size with greater than 50%  respiratory variability, suggesting right atrial pressure of 3 mmHg.  11. Normal LV systolic and diastolic function; trace MR and TR.     Laboratory Data:  High Sensitivity Troponin:  No results for input(s): TROPONINIHS in the last 720 hours.   Chemistry Recent Labs  Lab 02/07/19 0935  NA 139  K 5.2  CL 105  CO2 20  GLUCOSE 88  BUN 10  CREATININE 0.87  CALCIUM 8.9  GFRNONAA 94  GFRAA 109    Recent Labs  Lab 02/07/19 0935  PROT 6.0  ALBUMIN 3.9  AST 9  ALT 9  ALKPHOS 75  BILITOT 0.3   Hematology Recent Labs  Lab 02/07/19 0935  WBC 7.7  RBC 4.52  HGB 13.9  HCT 42.2  MCV 93  MCH 30.8  MCHC 32.9  RDW 12.6  PLT 272   BNPNo results for input(s): BNP, PROBNP in the last 168 hours.  DDimer No results for input(s): DDIMER in the last 168 hours.   Radiology/Studies:    {   Assessment and Plan:   1. CHB, prolonged pause on outpatient monitor     Tracings reviewed  Prolonged pause of 11 seconds occurred at 03:02AM, there is both sinus and AV (PR prolongation) slowing that suggests vagal event, artifact limits evaluation of the full event/sinus rates and recovery  other heart block events: -- 2.2 seconds, at 14:17PM, sinus arrhythmia, though there does appear to be slight PR prolongation, there is sinus slowing with the dropped beat. -- 2.2 seconds at 15:50PM, sinus arrhythmia, no clear PR prolongation, one dropped beat at a slower sinus rate -- 1.4seconds, at 08:09AM, [email or web address] with one dropped beat -- 1.8seconds, at 08:03AM with sinus slowing and PR prolongation, one dropped beat  SVT is 5 beats, artifact limits  I suspect predominantly vagal mediated, the long episode has P-P and PR prolongation and occurred at 03:02 AM  He is on dilt 240mg  daily, presumably for HTN, I do not see clear arrhythmia history He also had an  80% RCA lesion intervened on  He has reported orthostatic lightheadedness, and fleeting lightheaded spells when standing, never sitting or supine, he has never fainted.  I do not think he needs pacing He is urged to re-visit his sleep apnea and CPAP therapy (he no longer has a machine), will defer to Dr. He had a 5 beat SVT with some mention of palpitations, treatment of his apnea likely to help this as well, and would consider reducing his dilt.    Dr. Allyson Sabal will see later today     For questions or updates, please contact [facility] Please consult [email or web address] for contact info under     Signed, Graciela Husbands, PA-C  02/10/2019 3:12 PM  Coronary artery disease prior MI and prior stenting  There are normal LV function  Complete heart block/sinus arrest-concomitant-nocturnal  Sleep apnea-untreated  Mobitz 1 heart block  High-grade heart block occurring with simultaneous sinus node dysfunction is related to hyper vagotonia.  At night this is almost certainly a consequence of obstructive sleep apnea, known and known to be not treated.  Have encouraged him to use his CPAP.  Mobitz 1 heart block according to the day may be occurring while he is falling asleep during the day.  And of itself Mobitz 1 heart block does not require further intervention in the absence of significant  symptoms.  Patient has lightheadedness, but almost fully with positional changes i.e. standing is almost certainly not arrhythmic.  No indication for pacing at this time.  Glad to see the future if can be of assistance.

## 2019-02-10 NOTE — Progress Notes (Signed)
Site area: Right groin a 6 french arterial and 6 french venous sheath was removed by Tanya Nones [facility]  Site Prior to Removal:  Level 0  Pressure Applied For 15 MINUTES    Bedrest  Beginning at 1630p  Manual:   Yes.    Patient Status During Pull:  stable  Post Pull Groin Site:  Level 0  Post Pull Instructions Given:  Yes.    Post Pull Pulses Present:  Yes.    Dressing Applied:  Yes.    Comments:

## 2019-02-11 DIAGNOSIS — I455 Other specified heart block: Secondary | ICD-10-CM

## 2019-02-11 DIAGNOSIS — Z955 Presence of coronary angioplasty implant and graft: Secondary | ICD-10-CM | POA: Diagnosis not present

## 2019-02-11 DIAGNOSIS — I10 Essential (primary) hypertension: Secondary | ICD-10-CM

## 2019-02-11 DIAGNOSIS — R002 Palpitations: Secondary | ICD-10-CM | POA: Diagnosis not present

## 2019-02-11 DIAGNOSIS — I2584 Coronary atherosclerosis due to calcified coronary lesion: Secondary | ICD-10-CM | POA: Diagnosis not present

## 2019-02-11 DIAGNOSIS — I110 Hypertensive heart disease with heart failure: Secondary | ICD-10-CM | POA: Diagnosis not present

## 2019-02-11 DIAGNOSIS — E782 Mixed hyperlipidemia: Secondary | ICD-10-CM | POA: Diagnosis not present

## 2019-02-11 DIAGNOSIS — I25118 Atherosclerotic heart disease of native coronary artery with other forms of angina pectoris: Secondary | ICD-10-CM

## 2019-02-11 DIAGNOSIS — I25110 Atherosclerotic heart disease of native coronary artery with unstable angina pectoris: Secondary | ICD-10-CM | POA: Diagnosis not present

## 2019-02-11 LAB — BASIC METABOLIC PANEL
Anion gap: 10 (ref 5–15)
BUN: 11 mg/dL (ref 6–20)
CO2: 22 mmol/L (ref 22–32)
Calcium: 8.4 mg/dL — ABNORMAL LOW (ref 8.9–10.3)
Chloride: 106 mmol/L (ref 98–111)
Creatinine, Ser: 0.9 mg/dL (ref 0.61–1.24)
GFR calc Af Amer: >60 mL/min (ref >60)
GFR calc non Af Amer: >60 mL/min (ref >60)
Glucose, Bld: 96 mg/dL (ref 70–99)
Potassium: 4.4 mmol/L (ref 3.5–5.1)
Sodium: 138 mmol/L (ref 135–145)

## 2019-02-11 LAB — CBC
HCT: 37.4 % — ABNORMAL LOW (ref 39.0–52.0)
Hemoglobin: 12.6 g/dL — ABNORMAL LOW (ref 13.0–17.0)
MCH: 31 pg (ref 26.0–34.0)
MCHC: 33.7 g/dL (ref 30.0–36.0)
MCV: 92.1 fL (ref 80.0–100.0)
Platelets: 246 10*3/uL (ref 150–400)
RBC: 4.06 MIL/uL — ABNORMAL LOW (ref 4.22–5.81)
RDW: 12.7 % (ref 11.5–15.5)
WBC: 8.1 10*3/uL (ref 4.0–10.5)
nRBC: 0 % (ref 0.0–0.2)

## 2019-02-11 MED ORDER — DILTIAZEM HCL ER COATED BEADS 180 MG PO CP24: 180.0000 mg | ORAL_CAPSULE | Freq: Every day | ORAL | 0 refills | Status: DC

## 2019-02-11 MED ORDER — PANTOPRAZOLE SODIUM 40 MG PO TBEC: 40.0000 mg | DELAYED_RELEASE_TABLET | Freq: Every day | ORAL | 1 refills | Status: AC

## 2019-02-11 MED ORDER — DILTIAZEM HCL ER COATED BEADS 180 MG PO CP24: 180.0000 mg | ORAL_CAPSULE | Freq: Every day | ORAL | Status: DC

## 2019-02-11 MED ORDER — HYDROCHLOROTHIAZIDE 25 MG PO TABS: 25.0000 mg | ORAL_TABLET | Freq: Every day | ORAL | Status: DC

## 2019-02-11 MED ORDER — HYDROCHLOROTHIAZIDE 25 MG PO TABS: 25.0000 mg | ORAL_TABLET | Freq: Every day | ORAL | 0 refills | Status: DC

## 2019-02-11 MED ORDER — NICOTINE 21 MG/24HR TD PT24: 21.0000 mg | MEDICATED_PATCH | Freq: Every day | TRANSDERMAL | 0 refills | Status: DC

## 2019-02-11 MED FILL — Verapamil HCl IV Soln 2.5 MG/ML: INTRAVENOUS | Qty: 2 | Status: AC

## 2019-02-11 NOTE — Discharge Summary (Signed)
Discharge Summary    Patient ID: Troy French,  MRN: [id number], DOB/AGE: [birth year] 59 y.o.  Admit date: 02/10/2019 Discharge date: 02/11/2019  Primary Care Provider: Sonia Side. Primary Cardiologist: Quay Burow, MD  Discharge Diagnoses    Principal Problem:   Coronary artery disease Active Problems:   Essential hypertension   Hyperlipidemia   CAD in native artery   Sinus pause   Allergies Allergies  Allergen Reactions  . Nitroglycerin Nausea And Vomiting    SWEATING    Diagnostic Studies/Procedures    Cath: 02/10/19   Prox Cx to Mid Cx lesion is 100% stenosed.  Ost Cx to Prox Cx lesion is 80% stenosed.  Previously placed Mid RCA to Dist RCA stent (unknown type) is widely patent.  Prox RCA lesion is 80% stenosed.  A drug-eluting stent was successfully placed using a SYNERGY XD 2.50X28.  Post intervention, there is a 0% residual stenosis.  The left ventricular systolic function is normal.  LV end diastolic pressure is normal.  The left ventricular ejection fraction is 50-55% by visual estimate.   IMPRESSION: Successful highly calcified proximal RCA CSI and drug-eluting stenting.  He does have an occluded stent in the mid nondominant circumflex with high-grade proximal disease making it not amenable to intervention.  This will be treated medically.  He has normal LV function.  This easily removed and pressure held.  Patient be hydrated overnight.  He left lab in stable condition.  He can be discharged home in the morning on dual antiplatelet therapy including aspirin Plavix uninterrupted for at least 12 months.  Quay Burow. MD, [facility]  Diagnostic Dominance: Right  Intervention    _____________   History of Present Illness     59 y.o. who was referred to Dr. Gwenlyn Found by Dustin Folks, FNP to be established for cardiovascular valuation. 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 He has a history of essential hypertension and hyperlipidemia. Also has COPD. His father died at age 31 of CHF and had stents and brother has had stents as well. He has had at least 3 myocardial infarction's dating back to the early 2000's. He has had stents in [place], in [place] and most recently in [place] in 2015. At his last office visit he reported fairly frequent atypical quick burning chest pain with occasional chest pressure. He also complained of palpitations and shortness of breath most likely related to his COPD.  He had a 2D echo which was essentially normal and an event monitor that showed episodes of PSVT but also an 11-second pause.  He also complains of chest pain occurring several times a week. Given findings he was set up for outpatient cardiac cath.    Hospital Course     Consultants: EP (Dr. Caryl Comes)   Underwent cardiac cath noted above with 80% lesion in the pRCA, successful PCI/DES x1. Patent stent in the dRCA, with occluded non dominant Lcx stent but not amenable to intervention. Plan to continue on DAPT with ASA/plavix for at least one year. No chest pain post cath. Given his monitor recordings he was seen by EP during admission. 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 Noted that he has plans to see Dr. Claiborne Billings as an outpatient for sleep study. Recommendations were to decrease the Diltiazem, therefore  his home dose was reduced from '240mg'$  daily to '180mg'$  daily. Also added low dose HCTZ '25mg'$  daily. He worked with cardiac rehab without recurrent chest pain. Educated by PharmD prior to discharge.   General: Well developed, well nourished, male appearing in no acute distress. Head: Normocephalic, atraumatic.  Neck: Supple without bruits, JVD. Lungs:  Resp regular and unlabored, CTA. Heart: RRR, S1, S2, no S3, S4, or murmur; no  rub. Abdomen: Soft, non-tender, non-distended with normoactive bowel sounds. No hepatomegaly. No rebound/guarding. No obvious abdominal masses. Extremities: No clubbing, cyanosis, edema. Distal pedal pulses are 2+ bilaterally. Right femoral cath site stable without bruising or hematoma Neuro: Alert and oriented X 3. Moves all extremities spontaneously. Psych: Normal affect.  Troy French was seen by Dr. Ellyn Hack and determined stable for discharge home. Follow up in the office has been arranged. Medications are listed below.   _____________  Discharge Vitals Blood pressure 124/87, pulse 65, temperature 97.9 F (36.6 C), temperature source Oral, resp. rate 14, height '5\' 7"'$  (1.702 m), weight 80 kg, SpO2 99 %.  Filed Weights   02/10/19 1037 02/11/19 0500  Weight: 79.4 kg 80 kg    Labs & Radiologic Studies    CBC Recent Labs    02/11/19 0301  WBC 8.1  HGB 12.6*  HCT 37.4*  MCV 92.1  PLT 761   Basic Metabolic Panel Recent Labs    02/11/19 0301  NA 138  K 4.4  CL 106  CO2 22  GLUCOSE 96  BUN 11  CREATININE 0.90  CALCIUM 8.4*   Liver Function Tests No results for input(s): AST, ALT, ALKPHOS, BILITOT, PROT, ALBUMIN in the last 72 hours. No results for input(s): LIPASE, AMYLASE in the last 72 hours. Cardiac Enzymes No results for input(s): CKTOTAL, CKMB, CKMBINDEX, TROPONINI in the last 72 hours. BNP Invalid input(s): POCBNP D-Dimer No results for input(s): DDIMER in the last 72 hours. Hemoglobin A1C No results for input(s): HGBA1C in the last 72 hours. Fasting Lipid Panel No results for input(s): CHOL, HDL, LDLCALC, TRIG, CHOLHDL, LDLDIRECT in the last 72 hours. Thyroid Function Tests No results for input(s): TSH, T4TOTAL, T3FREE, THYROIDAB in the last 72 hours.  Invalid input(s): FREET3 _____________  CARDIAC CATHETERIZATION  Result Date: 02/10/2019  Prox Cx to Mid Cx lesion is 100% stenosed.  Ost Cx to Prox Cx lesion is 80% stenosed.  Previously placed Mid  RCA to Dist RCA stent (unknown type) is widely patent.  Prox RCA lesion is 80% stenosed.  A drug-eluting stent was successfully placed using a SYNERGY XD 2.50X28.  Post intervention, there is a 0% residual stenosis.  The left ventricular systolic function is normal.  LV end diastolic pressure is normal.  The left ventricular ejection fraction is 50-55% by visual estimate.  Troy French is a 59 y.o. male  [id number] LOCATION:  FACILITY: [facility] PHYSICIAN: Quay Burow, M.D. 12-13-1960 DATE OF PROCEDURE:  02/10/2019 DATE OF DISCHARGE: CARDIAC CATHETERIZATION / CSI DES RCA History obtained from chart review.Troy French is a 59 y.o.  thin appearing single Caucasian male father of 2 children, grandfather of 2 grandchildren referred by Dustin Folks, FNP to be established my practice and for cardiovascular valuation. 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 He does have history of essential hypertension hyperlipidemia. He has COPD. His father died at age 93 of  CHF and had stents and brother has had stents as well. He has had at least 3 myocardial infarction's dating back to the early 2000's. He has had stents in [place], in [place] and most recently in [place] in 2015. He does get fairly frequent atypical quick burning chest pain with occasional chest pressure. He also complains of palpitations and shortness of breath most likely related to his COPD.  He had a 2D echo which was essentially normal and an event monitor that showed episodes of PSVT but also an 11-second pause.  He also complains of chest pain occurring several times a week.  Based on this, we decided to proceed with outpatient diagnostic coronary angiography. PROCEDURE DESCRIPTION: The patient was brought to the second floor [facility] cath lab in the  postabsorptive state. He was premedicated with IV Versed and fentanyl. His right groinwas prepped and shaved in usual sterile fashion. Xylocaine 1% was used for local anesthesia.  I was unable to access his right radial artery.  A 6 French sheath was inserted into the right common femoral artery using standard Seldinger technique. The circumflex was occluded with collaterals, the right coronary artery had high-grade calcified proximal stenosis.  The distal stent in the RCA was patent.  Based on this I decided to proceed with diamondback orbital rotational atherectomy, PCI and stenting of the calcified proximal RCA stenosis. The patient received a total of 11,000 as of heparin with an ACT of 296.  He was already on aspirin and Plavix at home.  He received an additional 300 mg of p.o. Plavix at the end of the case in addition to 20 mg of IV Pepcid.  I initially used an AL 0.75 guide catheter which "deep threaded" down to the lesion and exchanged for a JR4 guide catheter which seated well in the vessel.  After demonstrating a therapeutic ACT I was able to cross the lesion primarily with a Viper FlexTip 014 wire.  I then used a 1.25 CSI coronary bur and performed for passes at 90,000 RPMs.  Following this I exchanged out the Viper wire through a Turnpike endhole catheter for an 014 300 cm long [place] guidewire.  I then predilated the calcified lesion with a 2 mm x 12 mm balloon and carefully positioned a 2.5 mm x 20 mm long Synergy drug-eluting stent across the entire calcified segment deploying at 14 to 16 atm.  I postdilated the distal half of the stent with a 2.75 x 15 mm long noncompliant balloon at 16 atm in the proximal half with a 3 mm x 15 mm long noncompliant balloon at 16 atm resulting reduction of an 80% calcified proximal dominant RCA stenosis to 0% residual.  The patient tolerated procedure well.  There is TIMI-3 flow at the end of the case.  Patient did not utilize that temporary transvenous pacemaker  during the case.   Successful highly calcified proximal RCA CSI and drug-eluting stenting.  He does have an occluded stent in the mid nondominant circumflex with high-grade proximal disease making it not amenable to intervention.  This will be treated medically.  He has normal LV function.  This easily removed and pressure held.  Patient be hydrated overnight.  He left lab in stable condition.  He can be discharged home in the morning on dual antiplatelet therapy including aspirin Plavix uninterrupted for at least 12 months. Quay Burow. MD, [facility] 02/10/2019 2:12 PM   LONG TERM MONITOR-LIVE TELEMETRY (3-14 DAYS)  Result Date: 01/31/2019 1: Sinus  rhythm/sinus bradycardia/sinus tachycardia 2: Episodes of SVT with PACs 3: Episodes of complete heart block with pauses as long as 11 seconds 4: Needs return office visit next Tuesday to discuss  Disposition   Pt is being discharged home today in good condition.  Follow-up Plans & Appointments    Follow-up Information    Lorretta Harp, MD Follow up on 02/26/2019.   Specialties: Cardiology, Radiology Why: at 3:30pm for your follow up appt. Contact information: [address] [place] [address] [phone]          Discharge Instructions    Amb Referral to Cardiac Rehabilitation   Complete by: As directed    Diagnosis: Coronary Stents   After initial evaluation and assessments completed: Virtual Based Care may be provided alone or in conjunction with Phase 2 Cardiac Rehab based on patient barriers.: Yes   Call MD for:  redness, tenderness, or signs of infection (pain, swelling, redness, odor or green/yellow discharge around incision site)   Complete by: As directed    Diet - low sodium heart healthy   Complete by: As directed    Discharge instructions   Complete by: As directed    Groin Site Care Refer to this sheet in the next few weeks. These instructions provide you with information on caring for yourself after  your procedure. Your caregiver may also give you more specific instructions. Your treatment has been planned according to current medical practices, but problems sometimes occur. Call your caregiver if you have any problems or questions after your procedure. HOME CARE INSTRUCTIONS You may shower 24 hours after the procedure. Remove the bandage (dressing) and gently wash the site with plain soap and water. Gently pat the site dry.  Do not apply powder or lotion to the site.  Do not sit in a bathtub, swimming pool, or whirlpool for 5 to 7 days.  No bending, squatting, or lifting anything over 10 pounds (4.5 kg) as directed by your caregiver.  Inspect the site at least twice daily.  Do not drive home if you are discharged the same day of the procedure. Have someone else drive you.  You may drive 24 hours after the procedure unless otherwise instructed by your caregiver.  What to expect: Any bruising will usually fade within 1 to 2 weeks.  Blood that collects in the tissue (hematoma) may be painful to the touch. It should usually decrease in size and tenderness within 1 to 2 weeks.  SEEK IMMEDIATE MEDICAL CARE IF: You have unusual pain at the groin site or down the affected leg.  You have redness, warmth, swelling, or pain at the groin site.  You have drainage (other than a small amount of blood on the dressing).  You have chills.  You have a fever or persistent symptoms for more than 72 hours.  You have a fever and your symptoms suddenly get worse.  Your leg becomes pale, cool, tingly, or numb.  You have heavy bleeding from the site. Hold pressure on the site. Marland Kitchen  PLEASE DO NOT MISS ANY DOSES OF YOUR PLAVIX!!!!! Also keep a log of you blood pressures and bring back to your follow up appt. Please call the office with any questions.   Patients taking blood thinners should generally stay away from medicines like ibuprofen, Advil, Motrin, naproxen, and Aleve due to risk of stomach bleeding. You may  take Tylenol as directed or talk to your primary doctor about alternatives.  Some studies suggest Prilosec/Omeprazole interacts with  Plavix. We changed your Prilosec/Omeprazole to the equivalent dose of Protonix for less chance of interaction.   Increase activity slowly   Complete by: As directed        Discharge Medications     Medication List    STOP taking these medications   diltiazem 240 MG 24 hr capsule Commonly known as: DILACOR XR Replaced by: diltiazem 180 MG 24 hr capsule   meloxicam 15 MG tablet Commonly known as: MOBIC   omeprazole 40 MG capsule Commonly known as: PRILOSEC Replaced by: pantoprazole 40 MG tablet     TAKE these medications   albuterol 108 (90 Base) MCG/ACT inhaler Commonly known as: VENTOLIN HFA Inhale 2 puffs into the lungs every 6 (six) hours as needed for wheezing or shortness of breath.   ALPRAZolam 0.5 MG tablet Commonly known as: XANAX Take 0.5 mg by mouth 3 (three) times daily.   aspirin 81 MG chewable tablet Chew 81 mg by mouth daily.   atorvastatin 40 MG tablet Commonly known as: LIPITOR Take 1 tablet (40 mg total) by mouth daily. What changed: when to take this   clopidogrel 75 MG tablet Commonly known as: PLAVIX Take 75 mg by mouth daily.   diltiazem 180 MG 24 hr capsule Commonly known as: CARDIZEM CD Take 1 capsule (180 mg total) by mouth daily. Replaces: diltiazem 240 MG 24 hr capsule   hydrochlorothiazide 25 MG tablet Commonly known as: HYDRODIURIL Take 1 tablet (25 mg total) by mouth daily.   HYDROcodone-acetaminophen 10-325 MG tablet Commonly known as: NORCO Take 1 tablet by mouth 3 (three) times daily.   lisinopril 20 MG tablet Commonly known as: ZESTRIL Take 20 mg by mouth daily.   morphine 30 MG 12 hr tablet Commonly known as: MS CONTIN Take 30 mg by mouth every 12 (twelve) hours.   nicotine 21 mg/24hr patch Commonly known as: NICODERM CQ - dosed in mg/24 hours Place 1 patch (21 mg total) onto the  skin daily.   pantoprazole 40 MG tablet Commonly known as: PROTONIX Take 1 tablet (40 mg total) by mouth daily. Replaces: omeprazole 40 MG capsule   phenylephrine 10 MG Tabs tablet Commonly known as: SUDAFED PE Take 10 mg by mouth daily as needed (allergies).   sodium chloride 0.65 % Soln nasal spray Commonly known as: OCEAN Place 1 spray into both nostrils as needed for congestion (dryness).   Spiriva Respimat 2.5 MCG/ACT Aers Generic drug: Tiotropium Bromide Monohydrate Inhale 2 puffs into the lungs daily.   tadalafil 5 MG tablet Commonly known as: CIALIS Take 5 mg by mouth daily.   terbinafine 1 % cream Commonly known as: LAMISIL Apply 1 application topically daily as needed (toenail fungus).   zolpidem 10 MG tablet Commonly known as: AMBIEN Take 10 mg by mouth at bedtime.        No                               Did the patient have a percutaneous coronary intervention (stent / angioplasty)?:  Yes.     Cath/PCI Registry Performance & Quality Measures: 1. Aspirin prescribed? - Yes 2. ADP Receptor Inhibitor (Plavix/Clopidogrel, Brilinta/Ticagrelor or Effient/Prasugrel) prescribed (includes medically managed patients)? - Yes 3. High Intensity Statin (Lipitor 40-'80mg'$  or Crestor 20-'40mg'$ ) prescribed? - Yes 4. For EF <40%, was ACEI/ARB prescribed? - Not Applicable (EF >/= 34%) 5. For EF <40%, Aldosterone Antagonist (Spironolactone or Eplerenone) prescribed? - Not Applicable (EF >/=  40%) 6. Cardiac Rehab Phase II ordered (Included Medically managed Patients)? - Yes      Outstanding Labs/Studies   Outpatient Sleep Study  Duration of Discharge Encounter   Greater than 30 minutes including physician time.  Signed, Reino Bellis NP-C 02/11/2019, 10:56 AM

## 2019-02-11 NOTE — Progress Notes (Signed)
Rounded on patient to let him know his AM meds would be passed out  shortly.  He stated he already took his home medications. Patient stated he did not take home plavix or norco as he received those this AM at shift change.   Laverda Page, NP at bedside and aware.

## 2019-02-11 NOTE — Progress Notes (Signed)
CARDIAC REHAB PHASE I   PRE:  Rate/Rhythm: 92 SR  BP:  Supine:   Sitting: 127/87  Standing:    SaO2: 99%RA  MODE:  Ambulation: 700 ft   POST:  Rate/Rhythm: 112 ST  BP:  Supine:   Sitting: 161/84  Standing:    SaO2: 99%RA 0750-0837 Pt walked 700 ft on RA with steady gait and no CP. Tolerated well. Discussed importance of plavix with stent. Did not review NTG as pt stated he is allergic to it. Will call 911 with CP. Gave heart healthy diet and walking instructions for ex. Encouraged smoking cessation. Gave handout and pt stated going to call the toll free number. Pt stated he has attended CRP 2 before and would like to attend again. Referred to [place]. Pt is interested in participating in Virtual Cardiac and Pulmonary Rehab. Pt advised that Virtual Cardiac and Pulmonary Rehab is provided at no cost to the patient.  Checklist:  1. Pt has smart device  ie smartphone and/or ipad for downloading an app  Yes 2. Reliable internet/wifi service    Yes 3. Understands how to use their smartphone and navigate within an app.  Yes   Pt verbalized understanding and is in agreement.    Luetta Nutting, RN BSN  02/11/2019 8:32 AM

## 2019-02-12 ENCOUNTER — Encounter (HOSPITAL_COMMUNITY): Payer: Self-pay | Admitting: *Deleted

## 2019-02-12 ENCOUNTER — Telehealth: Payer: Self-pay | Admitting: Cardiovascular Disease

## 2019-02-12 NOTE — Progress Notes (Signed)
Referral received and verified for MD signature.  Follow up appt is 02/26/19. Due to the senior leadership directed instructions to pause onsite cardiac rehab in response to the surge of Covid + patients within the health system staff were deployed to support health system and community needs.  Will wait to check pt insurance benefits and eligibility once onsite cardiac rehab is permitted to reopen. Pt will be contacted for scheduling [facility] Re[facility] upon satisfactorily completing their follow up appt on 02/26/19.   Alanson Aly, BSN Cardiac and Emergency planning/management officer

## 2019-02-12 NOTE — Telephone Encounter (Signed)
Called patient, advised of instructions that were given at discharge. Patient has no redness or swelling at his site, no other questions or concerns.

## 2019-02-12 NOTE — Telephone Encounter (Signed)
Patient following up on his heart cath procedure - he states that he read he could take the covering off of his wound but he wanted to make sure it was okay before he did so.

## 2019-02-24 ENCOUNTER — Institutional Professional Consult (permissible substitution): Payer: Medicare HMO | Admitting: Internal Medicine

## 2019-02-26 ENCOUNTER — Other Ambulatory Visit: Payer: Self-pay

## 2019-02-26 ENCOUNTER — Ambulatory Visit (INDEPENDENT_AMBULATORY_CARE_PROVIDER_SITE_OTHER): Payer: Medicare HMO | Admitting: Cardiovascular Disease

## 2019-02-26 ENCOUNTER — Encounter: Payer: Self-pay | Admitting: Cardiovascular Disease

## 2019-02-26 DIAGNOSIS — I10 Essential (primary) hypertension: Secondary | ICD-10-CM | POA: Diagnosis not present

## 2019-02-26 DIAGNOSIS — Z720 Tobacco use: Secondary | ICD-10-CM

## 2019-02-26 DIAGNOSIS — E782 Mixed hyperlipidemia: Secondary | ICD-10-CM

## 2019-02-26 MED ORDER — DILTIAZEM HCL ER COATED BEADS 120 MG PO CP24: 120.0000 mg | ORAL_CAPSULE | Freq: Every day | ORAL | 3 refills | Status: DC

## 2019-02-26 MED ORDER — LISINOPRIL 10 MG PO TABS: 10.0000 mg | ORAL_TABLET | Freq: Every day | ORAL | 3 refills | Status: AC

## 2019-02-26 MED ORDER — HYDROCHLOROTHIAZIDE 12.5 MG PO TABS: 12.5000 mg | ORAL_TABLET | Freq: Every day | ORAL | 3 refills | Status: DC

## 2019-02-26 MED ORDER — METOPROLOL SUCCINATE ER 25 MG PO TB24: 25.0000 mg | ORAL_TABLET | Freq: Every day | ORAL | 3 refills | Status: DC

## 2019-02-26 NOTE — Assessment & Plan Note (Signed)
History of hyperlipidemia on atorvastatin 40 mg a day with lipid profile performed 02/07/2019 revealing a total cholesterol 132, and LDL 71.

## 2019-02-26 NOTE — Progress Notes (Signed)
02/26/2019 Lynett Fish   [birth year]  [id number]  Primary Physician [name]., FNP Primary Cardiologist: Runell Gess MD Milagros Loll, [place], [place]  HPI:  Troy French is a 59 y.o.  thin appearing single Caucasian male father of 2 children, grandfather of 2 grandchildren referred by Fatima Sanger, FNP to be established my practice and for cardiovascular valuation. 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  I last saw him in the office 02/04/2019.His risk factors include 50 pack years of tobacco use continue to smoke 1 pack a day recalcitrant to structure modification. He does have history of essential hypertension hyperlipidemia. He has COPD. His father died at age 41 of CHF and had stents and brother has had stents as well. He has had at least 3 myocardial infarction's dating back to the early 2000's. He has had stents in [place], in [place] and most recently in [place] in 2015. He does get fairly frequent atypical quick burning chest pain with occasional chest pressure. He also complains of palpitations and shortness of breath most likely related to his COPD.  He had a 2D echo which was essentially normal and an event monitor that showed episodes of PSVT but also an 11-second pause.  He also complains of chest pain occurring several times a week.  I performed coronary angiography on him via the right femoral approach on 02/10/2019 revealing occluded circumflex stent, widely patent LAD, widely patent distal RCA stent with a new calcified 80% proximal RCA stenosis.  He had normal LV function.  I performed orbital rotational atherectomy of his proximal RCA and stenting using a synergy drug-eluting stent with an excellent result.  He was discharged home the following day.  Since being at home he has felt somewhat lightheaded and seems to have symptomatic hypotension with some minimal residual chest pain.  His right groin has healed nicely.   Current Meds  Medication Sig   . albuterol (VENTOLIN HFA) 108 (90 Base) MCG/ACT inhaler Inhale 2 puffs into the lungs every 6 (six) hours as needed for wheezing or shortness of breath.  . ALPRAZolam (XANAX) 0.5 MG tablet Take 0.5 mg by mouth 3 (three) times daily.  Marland Kitchen aspirin 81 MG chewable tablet Chew 81 mg by mouth daily.   Marland Kitchen atorvastatin (LIPITOR) 40 MG tablet Take 1 tablet (40 mg total) by mouth daily. (Patient taking differently: Take 40 mg by mouth every evening. )  . clopidogrel (PLAVIX) 75 MG tablet Take 75 mg by mouth daily.  Marland Kitchen diltiazem (CARDIZEM CD) 120 MG 24 hr capsule Take 1 capsule (120 mg total) by mouth daily.  . hydrochlorothiazide (HYDRODIURIL) 12.5 MG tablet Take 1 tablet (12.5 mg total) by mouth daily.  Marland Kitchen HYDROcodone-acetaminophen (NORCO) 10-325 MG tablet Take 1 tablet by mouth 3 (three) times daily.  Marland Kitchen lisinopril (ZESTRIL) 10 MG tablet Take 1 tablet (10 mg total) by mouth daily.  Marland Kitchen morphine (MS CONTIN) 30 MG 12 hr tablet Take 30 mg by mouth every 12 (twelve) hours.  . nicotine (NICODERM CQ - DOSED IN MG/24 HOURS) 21 mg/24hr patch Place 1 patch (21 mg total) onto the skin daily.  . pantoprazole (PROTONIX) 40 MG tablet Take 1 tablet (40 mg total) by mouth daily.  . phenylephrine (SUDAFED PE) 10 MG TABS tablet Take 10 mg by mouth daily as needed (allergies).  . sodium chloride (OCEAN) 0.65 % SOLN nasal spray Place 1 spray into both nostrils as needed for congestion (dryness).  . SPIRIVA RESPIMAT 2.5  MCG/ACT AERS Inhale 2 puffs into the lungs daily.   . tadalafil (CIALIS) 5 MG tablet Take 5 mg by mouth daily.  Marland Kitchen terbinafine (LAMISIL) 1 % cream Apply 1 application topically daily as needed (toenail fungus).  . zolpidem (AMBIEN) 10 MG tablet Take 10 mg by mouth at bedtime.   . [DISCONTINUED] diltiazem (CARDIZEM CD) 180 MG 24 hr capsule Take 1 capsule (180 mg total) by mouth daily.  . [DISCONTINUED] hydrochlorothiazide (HYDRODIURIL) 25 MG tablet Take 1 tablet (25 mg total) by mouth daily.  . [DISCONTINUED]  lisinopril (ZESTRIL) 20 MG tablet Take 20 mg by mouth daily.     Allergies  Allergen Reactions  . Nitroglycerin Nausea And Vomiting    SWEATING    Social History   Socioeconomic History  . Marital status: Single    Spouse name: Not on file  . Number of children: Not on file  . Years of education: Not on file  . Highest education level: Not on file  Occupational History  . Not on file  Tobacco Use  . Smoking status: Current Every Day Smoker  . Smokeless tobacco: Never Used  Substance and Sexual Activity  . Alcohol use: Not Currently  . Drug use: Not on file  . Sexual activity: Not on file  Other Topics Concern  . Not on file  Social History Narrative  . Not on file   Social Determinants of Health   Financial Resource Strain:   . Difficulty of Paying Living Expenses: Not on file  Food Insecurity:   . Worried About Charity fundraiser in the Last Year: Not on file  . Ran Out of Food in the Last Year: Not on file  Transportation Needs:   . Lack of Transportation (Medical): Not on file  . Lack of Transportation (Non-Medical): Not on file  Physical Activity:   . Days of Exercise per Week: Not on file  . Minutes of Exercise per Session: Not on file  Stress:   . Feeling of Stress : Not on file  Social Connections:   . Frequency of Communication with Friends and Family: Not on file  . Frequency of Social Gatherings with Friends and Family: Not on file  . Attends Religious Services: Not on file  . Active Member of Clubs or Organizations: Not on file  . Attends Archivist Meetings: Not on file  . Marital Status: Not on file  Intimate Partner Violence:   . Fear of Current or Ex-Partner: Not on file  . Emotionally Abused: Not on file  . Physically Abused: Not on file  . Sexually Abused: Not on file     Review of Systems: General: negative for chills, fever, night sweats or weight changes.  Cardiovascular: negative for chest pain, dyspnea on exertion, edema,  orthopnea, palpitations, paroxysmal nocturnal dyspnea or shortness of breath Dermatological: negative for rash Respiratory: negative for cough or wheezing Urologic: negative for hematuria Abdominal: negative for nausea, vomiting, diarrhea, bright red blood per rectum, melena, or hematemesis Neurologic: negative for visual changes, syncope, or dizziness All other systems reviewed and are otherwise negative except as noted above.    Blood pressure 121/73, pulse (!) 104, height 5\' 7"  (1.702 m), weight 172 lb 3.2 oz (78.1 kg), SpO2 98 %.  General appearance: alert and no distress Neck: no adenopathy, no carotid bruit, no JVD, supple, symmetrical, trachea midline and thyroid not enlarged, symmetric, no tenderness/mass/nodules Lungs: clear to auscultation bilaterally Heart: regular rate and rhythm, S1, S2 normal, no  murmur, click, rub or gallop Extremities: extremities normal, atraumatic, no cyanosis or edema Pulses: 2+ and symmetric Skin: Skin color, texture, turgor normal. No rashes or lesions Neurologic: Alert and oriented X 3, normal strength and tone. Normal symmetric reflexes. Normal coordination and gait  EKG not performed today  ASSESSMENT AND PLAN:   Tobacco abuse Continue tobacco abuse with the intent to stop next month  Essential hypertension History of essential hypertension with blood pressure measured today at 121/73 does complain of symptomatic hypotension.  I am going to adjust his blood pressure medicines, cut his diltiazem back from CD 180 to 120 mg a day, cut his hydrochlorothiazide back from 25 mg to 12.5 mg a day and his lisinopril from 20 mg to 10 mg a day.  I am going to add Toprol-XL 25 mg a day since his heart rate is 104.  I will have him see an APP back in 1 month to assess and I will see him back in 6 months.  Hyperlipidemia History of hyperlipidemia on atorvastatin 40 mg a day with lipid profile performed 02/07/2019 revealing a total cholesterol 132, and LDL  71.  Coronary artery disease History of CAD status post multiple stents in the past in multiple states including [address], [address] and [place] most recently in 2015.  Because of chest pain I performed cardiac catheterization on him 02/10/2019 revealing occluded proximal AV groove circumflex stent, 80% calcified proximal RCA stenosis and a widely patent distal RCA stent.  I performed orbital rotational arthrectomy, PCI and drug-eluting stenting with a 2.5 mm x 28 mm long Synergy drug-eluting stent.  I did post dilate.  He has had minimal chest pain since.  He is on dual antiplatelet therapy.      Runell Gess MD FACP,FACC,FAHA, [facility] 02/26/2019 3:57 PM

## 2019-02-26 NOTE — Assessment & Plan Note (Signed)
History of CAD status post multiple stents in the past in multiple states including [address], [address] and [place] most recently in 2015.  Because of chest pain I performed cardiac catheterization on him 02/10/2019 revealing occluded proximal AV groove circumflex stent, 80% calcified proximal RCA stenosis and a widely patent distal RCA stent.  I performed orbital rotational arthrectomy, PCI and drug-eluting stenting with a 2.5 mm x 28 mm long Synergy drug-eluting stent.  I did post dilate.  He has had minimal chest pain since.  He is on dual antiplatelet therapy.

## 2019-02-26 NOTE — Assessment & Plan Note (Signed)
History of essential hypertension with blood pressure measured today at 121/73 does complain of symptomatic hypotension.  I am going to adjust his blood pressure medicines, cut his diltiazem back from CD 180 to 120 mg a day, cut his hydrochlorothiazide back from 25 mg to 12.5 mg a day and his lisinopril from 20 mg to 10 mg a day.  I am going to add Toprol-XL 25 mg a day since his heart rate is 104.  I will have him see an APP back in 1 month to assess and I will see him back in 6 months.

## 2019-02-26 NOTE — Assessment & Plan Note (Signed)
Continue tobacco abuse with the intent to stop next month

## 2019-02-26 NOTE — Patient Instructions (Addendum)
Medication Instructions:  Decrease Cardizem to 120mg  Daily. Decrease Hydrochlorothiazide to 12.5mg  Daily. Decrease Lisinopril to 10mg  Daily. Start taking 50mg  Toprol XL  If you need a refill on your cardiac medications before your next appointment, please call your pharmacy.   Lab work: NONE  Testing/Procedures: NONE  Follow-Up: At , you and your health needs are our priority.  As part of our continuing mission to provide you with exceptional heart care, we have created designated Provider Care Teams.  These Care Teams include your primary Cardiologist (physician) and Advanced Practice Providers (APPs -  Physician Assistants and Nurse Practitioners) who all work together to provide you with the care you need, when you need it. You may see , MD or one of the following Advanced Practice Providers on your designated Care Team:    , PA-C  [place], Nanetta Batty  Corine Shelter, [place]  Your physician wants you to follow-up in: 4 weeks with a [place]. Your physician wants you to follow-up in: 3 months with Dr. Edd Fabian

## 2019-02-28 ENCOUNTER — Telehealth (HOSPITAL_COMMUNITY): Payer: Self-pay

## 2019-02-28 NOTE — Telephone Encounter (Signed)
Attempted to call patient in regards to Cardiac Rehab - LM on VM 

## 2019-02-28 NOTE — Telephone Encounter (Signed)
Pt insurance is active and benefits verified through [employer] [place] $10, DED 0/0 met, out of pocket $3,900/$345 met, co-insurance 0%. no pre-authorization required. Passport, 02/28/2019'@11'$ :26am, REF# [id number]  Will contact patient to see if he is interested in the Cardiac Rehab Program. If interested, patient will need to complete follow up appt. Once completed, patient will be contacted for scheduling upon review by the RN Navigator.

## 2019-03-05 ENCOUNTER — Encounter (HOSPITAL_COMMUNITY): Payer: Self-pay

## 2019-03-05 NOTE — Telephone Encounter (Signed)
Attempted to call pt a 2nd time regarding Cardiac rehab - LM ON VM  Mailed letter out

## 2019-03-18 ENCOUNTER — Ambulatory Visit: Payer: Medicare Other | Admitting: Cardiovascular Disease

## 2019-03-20 ENCOUNTER — Encounter (HOSPITAL_COMMUNITY): Payer: Self-pay | Admitting: *Deleted

## 2019-03-20 ENCOUNTER — Encounter: Payer: Self-pay | Admitting: Physician Assistant

## 2019-03-20 NOTE — Progress Notes (Signed)
No response from please contact letter sent to pt on 03/05/19.  Will close this referral for cardiac rehab. Kaizlee Carlino RN, BSN Cardiac and Pulmonary Rehab Nurse Navigator    

## 2019-03-26 ENCOUNTER — Encounter: Payer: Self-pay | Admitting: Physician Assistant

## 2019-03-26 ENCOUNTER — Other Ambulatory Visit: Payer: Self-pay

## 2019-03-26 ENCOUNTER — Ambulatory Visit: Payer: Medicare HMO | Admitting: Physician Assistant

## 2019-03-26 VITALS — BP 122/80 | HR 79 | Temp 97.0°F | Ht 67.0 in | Wt 174.0 lb

## 2019-03-26 DIAGNOSIS — I25119 Atherosclerotic heart disease of native coronary artery with unspecified angina pectoris: Secondary | ICD-10-CM | POA: Diagnosis not present

## 2019-03-26 DIAGNOSIS — I10 Essential (primary) hypertension: Secondary | ICD-10-CM

## 2019-03-26 DIAGNOSIS — I729 Aneurysm of unspecified site: Secondary | ICD-10-CM | POA: Diagnosis not present

## 2019-03-26 DIAGNOSIS — R079 Chest pain, unspecified: Secondary | ICD-10-CM

## 2019-03-26 DIAGNOSIS — Z720 Tobacco use: Secondary | ICD-10-CM

## 2019-03-26 DIAGNOSIS — G4733 Obstructive sleep apnea (adult) (pediatric): Secondary | ICD-10-CM

## 2019-03-26 DIAGNOSIS — J449 Chronic obstructive pulmonary disease, unspecified: Secondary | ICD-10-CM

## 2019-03-26 DIAGNOSIS — E785 Hyperlipidemia, unspecified: Secondary | ICD-10-CM

## 2019-03-26 DIAGNOSIS — R42 Dizziness and giddiness: Secondary | ICD-10-CM

## 2019-03-26 MED ORDER — RANOLAZINE ER 500 MG PO TB12: 500.0000 mg | ORAL_TABLET | Freq: Two times a day (BID) | ORAL | 2 refills | Status: DC

## 2019-03-26 NOTE — Progress Notes (Signed)
Cardiology Office Note:    Date:  03/28/2019   ID:  Troy French, DOB [birth year], MRN [id number]  PCP:  [name]., FNP  Cardiologist:  Nanetta Batty, MD  Electrophysiologist:  None   Referring MD: [name]., FNP   Chief Complaint  Patient presents with   Follow-up    seen for Dr. Allyson Sabal    History of Present Illness:    Troy French is a 59 y.o. male with a hx of HTN, HLD, tobacco abuse, COPD and CAD.  He also has significant family history of CAD as well.  Patient had a history of stents in [place], [place] and also in [place] in 2015.  Previous echocardiogram obtained on 12/27/2018 showed EF 60 to 65%, no regional wall motion abnormality, no significant valve issue.  Heart monitor obtained in January 2021 showed sinus rhythm, episodes of PACs and SVT, one episode of complete heart block as long as 11 seconds.  He was seen back by Dr. Gery Pray on 02/04/2019, he was referred to the electrophysiology service for further assessment.  Given episode of chest pain, Dr. Allyson Sabal eventually performed a cardiac catheterization on 02/10/2019 which demonstrated 100% proximal left circumflex occlusion, 80% ostial left circumflex lesion, widely patent mid RCA stent, 80% proximal RCA lesion treated with synergy 2.5 x 28 mm DES, EF 50 to 55%.  Postprocedure, patient was placed on aspirin and Plavix.  During the hospitalization, patient was evaluated by Dr. Graciela Husbands who felt his dizziness is more orthostatic than related to her arrhythmia, therefore no pacemaker was recommended.  However it is recommended that he revisit his obstructive sleep apnea and restart CPAP machine in the future.  He returned for follow-up on 2/17 and was evaluated by Dr. Allyson Sabal for occasional hypotension and the dizzy spell.  Diltiazem CD was cut back from 180 mg daily to 120 mg daily.  Hydrochlorothiazide was Cut back down to 12.5 mg daily.  Lisinopril was also reduced to 10 mg daily.  Toprol-XL 25 mg daily was added to  his medical regimen since his baseline heart rate was 104.   Patient presents today for cardiology office visit.  He is presenting with multiple issues.  He is dizzy spell has been improving however continue to have intermittent dizziness.  I recommended discontinue hydrochlorothiazide.  Since the cardiac catheterization he has been having worsening right groin pain with palpation.  I did not hear any bruit or pulsatile mass on physical exam, he will need a right groin ultrasound to make sure there is no pseudoaneurysm.  He has been having worsening chest discomfort.  He says the chest pain initially got better after the cardiac catheterization however came back full force since then.  He will need a P2Y12 study since he has been compliant with the Plavix therapy.  Tobacco cessation strongly advised.  I discussed the case with Dr. Allyson Sabal over the phone, Dr. Allyson Sabal recommended referred the patient to Dr. Swazil[place] for consideration PCI CTO.  He also needs a sleep study to evaluate obstructive sleep apnea, he says it has been more than 20 years since he wore a CPAP machine.  This is for the nocturnal bradycardia and the daytime somnolence.  Past Medical History:  Diagnosis Date   Arthritis    CHF (congestive heart failure) (HCC)    COPD (chronic obstructive pulmonary disease) (HCC)    Coronary artery disease    Hyperlipidemia    Hypertension     Past Surgical History:  Procedure Laterality  Date   cardiac stents     CORONARY ATHERECTOMY N/A 02/10/2019   Procedure: CORONARY ATHERECTOMY;  Surgeon: Runell Gess, MD;  Location: [facility] INVASIVE CV LAB;  Service: Cardiovascular;  Laterality: N/A;  Mid RCA   CORONARY STENT INTERVENTION N/A 02/10/2019   Procedure: CORONARY STENT INTERVENTION;  Surgeon: Runell Gess, MD;  Location: [facility] INVASIVE CV LAB;  Service: Cardiovascular;  Laterality: N/A;  Mid RCA   LEFT HEART CATH AND CORONARY ANGIOGRAPHY N/A 02/10/2019   Procedure: LEFT HEART CATH AND CORONARY  ANGIOGRAPHY;  Surgeon: Runell Gess, MD;  Location: [facility] INVASIVE CV LAB;  Service: Cardiovascular;  Laterality: N/A;   TEMPORARY PACEMAKER N/A 02/10/2019   Procedure: TEMPORARY PACEMAKER;  Surgeon: Runell Gess, MD;  Location: [facility] INVASIVE CV LAB;  Service: Cardiovascular;  Laterality: N/A;    Current Medications: No outpatient medications have been marked as taking for the 03/26/19 encounter (Office Visit) with Azalee Course, PA.     Allergies:   Nitroglycerin   Social History   Socioeconomic History   Marital status: Single    Spouse name: Not on file   Number of children: Not on file   Years of education: Not on file   Highest education level: Not on file  Occupational History   Not on file  Tobacco Use   Smoking status: Current Every Day Smoker   Smokeless tobacco: Never Used  Substance and Sexual Activity   Alcohol use: Not Currently   Drug use: Not on file   Sexual activity: Not on file  Other Topics Concern   Not on file  Social History Narrative   Not on file   Social Determinants of Health   Financial Resource Strain:    Difficulty of Paying Living Expenses:   Food Insecurity:    Worried About Running Out of Food in the Last Year:    Barista in the Last Year:   Transportation Needs:    Freight forwarder (Medical):    Lack of Transportation (Non-Medical):   Physical Activity:    Days of Exercise per Week:    Minutes of Exercise per Session:   Stress:    Feeling of Stress :   Social Connections:    Frequency of Communication with Friends and Family:    Frequency of Social Gatherings with Friends and Family:    Attends Religious Services:    Active Member of Clubs or Organizations:    Attends Banker Meetings:    Marital Status:      Family History: The patient's family history includes Diabetes in his mother.  ROS:   Please see the history of present illness.     All other systems reviewed and  are negative.  EKGs/Labs/Other Studies Reviewed:    The following studies were reviewed today:  Echo 12/27/2018 IMPRESSIONS    1. Left ventricular ejection fraction, by visual estimation, is 60 to  65%. The left ventricle has normal function. There is no left ventricular  hypertrophy.  2. The left ventricle has no regional wall motion abnormalities.  3. Global right ventricle has normal systolic function.The right  ventricular size is normal.  4. Left atrial size was normal.  5. Right atrial size was normal.  6. The mitral valve is normal in structure. Trivial mitral valve  regurgitation. No evidence of mitral stenosis.  7. The tricuspid valve is normal in structure. Tricuspid valve  regurgitation is trivial.  8. The aortic valve is tricuspid. Aortic valve  regurgitation is not  visualized. Mild aortic valve sclerosis without stenosis.  9. The pulmonic valve was normal in structure. Pulmonic valve  regurgitation is not visualized.  10. The inferior vena cava is normal in size with greater than 50%  respiratory variability, suggesting right atrial pressure of 3 mmHg.  11. Normal LV systolic and diastolic function; trace MR and TR.    Cath 02/10/2019  Prox Cx to Mid Cx lesion is 100% stenosed.  Ost Cx to Prox Cx lesion is 80% stenosed.  Previously placed Mid RCA to Dist RCA stent (unknown type) is widely patent.  Prox RCA lesion is 80% stenosed.  A drug-eluting stent was successfully placed using a SYNERGY XD [id number].  Post intervention, there is a 0% residual stenosis.  The left ventricular systolic function is normal.  LV end diastolic pressure is normal.  The left ventricular ejection fraction is 50-55% by visual estimate.  EKG:  EKG is ordered today.  The ekg ordered today demonstrates NSR without significant ST-T wave changes  Recent Labs: 02/07/2019: ALT 9 02/11/2019: BUN 11; Creatinine, Ser 0.90; Hemoglobin 12.6; Platelets 246; Potassium 4.4; Sodium 138   Recent Lipid Panel    Component Value Date/Time   CHOL 132 02/07/2019 0935   TRIG 126 02/07/2019 0935   HDL 38 (L) 02/07/2019 0935   CHOLHDL 3.5 02/07/2019 0935   LDLCALC 71 02/07/2019 0935    Physical Exam:    VS:  BP 122/80    Pulse 79    Temp (!) 97 F (36.1 C)    Ht 5\' 7"  (1.702 m)    Wt 174 lb (78.9 kg)    SpO2 96%    BMI 27.25 kg/m     Wt Readings from Last 3 Encounters:  03/26/19 174 lb (78.9 kg)  02/26/19 172 lb 3.2 oz (78.1 kg)  02/11/19 176 lb 6.4 oz (80 kg)     GEN:  Well nourished, well developed in no acute distress HEENT: Normal NECK: No JVD; No carotid bruits LYMPHATICS: No lymphadenopathy CARDIAC: RRR, no murmurs, rubs, gallops RESPIRATORY:  Clear to auscultation without rales, wheezing or rhonchi  ABDOMEN: Soft, non-tender, non-distended MUSCULOSKELETAL:  No edema; No deformity  SKIN: Warm and dry NEUROLOGIC:  Alert and oriented x 3 PSYCHIATRIC:  Normal affect   ASSESSMENT:    1. Chest pain of uncertain etiology   2. Pseudoaneurysm (HCC)   3. Coronary artery disease involving native coronary artery of native heart with angina pectoris (HCC)   4. Essential hypertension   5. Hyperlipidemia LDL goal <70   6. Chronic obstructive pulmonary disease, unspecified COPD type (HCC)   7. Dizziness   8. Tobacco abuse   9. OSA (obstructive sleep apnea)    PLAN:    In order of problems listed above:  1. Chest pain: Case has been discussed with Dr. 04/11/19 who has reviewed the recent cardiac catheterization report.  He says he might had a few weeks of relief however chest pain has came back full force.  Dr. Allyson Sabal recommended referral to Dr. Allyson Sabal to consider PCI CTO.  I recommended initiation of Ranexa 500 mg twice daily.  He has been compliant with Plavix therapy.  I recommend a P2Y12 study  2. Right groin pain: Concerning for a pseudoaneurysm due to worsening pain since cardiac catheterization.  However on physical exam, I do not hear any bruit nor do I feel  any pulsatile mass.  I will obtain groin arterial Doppler  3. CAD: Recently underwent cardiac catheterization.  Chest pain initially improved however came back.  He has been compliant on aspirin and Plavix.  4. Hypertension: Blood pressure stable on current therapy  5. Hyperlipidemia: Continue statin therapy  6. COPD: No acute exacerbation  7. Orthostatic dizziness, I will discontinue hydrochlorothiazide  8. Tobacco abuse: Tobacco cessation strongly advised  9. Obstructive sleep apnea: He has not used CPAP machine for 20 years.  I suspect his daytime somnolence is related to untreated obstructive sleep apnea, recommend a repeat sleep study.   Medication Adjustments/Labs and Tests Ordered: Current medicines are reviewed at length with the patient today.  Concerns regarding medicines are outlined above.  Orders Placed This Encounter  Procedures   EKG 12-Lead   VAS [place] GROIN PSEUDOANEURYSM   Meds ordered this encounter  Medications   ranolazine (RANEXA) 500 MG 12 hr tablet    Sig: Take 1 tablet (500 mg total) by mouth 2 (two) times daily.    Dispense:  90 tablet    Refill:  2    Patient Instructions  Medication Instructions:   STOP HCTZ  START RANEXA 500 MG 2 TIMES A DAY  *If you need a refill on your cardiac medications before your next appointment, please call your pharmacy*  Lab Work: Your physician recommends that you return for lab work BY END OF WEEK:  P2Y12  If you have labs (blood work) drawn today and your tests are completely normal, you will receive your results only by:  [place] (if you have MyChart) OR  A paper copy in the mail If you have any lab test that is abnormal or we need to change your treatment, we will call you to review the results.   Testing/Procedures: Your physician recommends that you have an ultrasound of your Right Groin within the next 1-2 days.  You have been referred to Peter Martinique, MD for a consultation for  [place]: At [facility], you and your health needs are our priority.  As part of our continuing mission to provide you with exceptional heart care, we have created designated Provider Care Teams.  These Care Teams include your primary Cardiologist (physician) and Advanced Practice Providers (APPs -  Physician Assistants and Nurse Practitioners) who all work together to provide you with the care you need, when you need it.  We recommend signing up for the patient portal called "MyChart".  Sign up information is provided on this After Visit Summary.  MyChart is used to connect with patients for Virtual Visits (Telemedicine).  Patients are able to view lab/test results, encounter notes, upcoming appointments, etc.  Non-urgent messages can be sent to your provider as well.   To learn more about what you can do with MyChart, go to [email or web address].    Your next appointment:      The format for your next appointment:     Provider:     Other Instructions      Signed, Almyra Deforest, [place]  03/28/2019 11:37 PM    [place]

## 2019-03-26 NOTE — Patient Instructions (Addendum)
Medication Instructions:   STOP HCTZ  START RANEXA 500 MG 2 TIMES A DAY  *If you need a refill on your cardiac medications before your next appointment, please call your pharmacy*  Lab Work: Your physician recommends that you return for lab work BY END OF WEEK:  P2Y12  If you have labs (blood work) drawn today and your tests are completely normal, you will receive your results only by: [place] MyChart Message (if you have MyChart) OR . A paper copy in the mail If you have any lab test that is abnormal or we need to change your treatment, we will call you to review the results.   Testing/Procedures: Your physician recommends that you have an ultrasound of your Right Groin within the next 1-2 days.  You have been referred to Peter Sw[place], MD for a consultation for [facility]  Follow-Up: At [facility], you and your health needs are our priority.  As part of our continuing mission to provide you with exceptional heart care, we have created designated Provider Care Teams.  These Care Teams include your primary Cardiologist (physician) and Advanced Practice Providers (APPs -  Physician Assistants and Nurse Practitioners) who all work together to provide you with the care you need, when you need it.  We recommend signing up for the patient portal called "MyChart".  Sign up information is provided on this After Visit Summary.  MyChart is used to connect with patients for Virtual Visits (Telemedicine).  Patients are able to view lab/test results, encounter notes, upcoming appointments, etc.  Non-urgent messages can be sent to your provider as well.   To learn more about what you can do with MyChart, go to [email or web address].    Your next appointment:      The format for your next appointment:     Provider:     Other Instructions

## 2019-03-27 ENCOUNTER — Ambulatory Visit (HOSPITAL_COMMUNITY)
Admission: RE | Admit: 2019-03-27 | Discharge: 2019-03-27 | Disposition: A | Discharge status: Home / Self Care | Payer: Medicare HMO | Source: Ambulatory Visit | Attending: Internal Medicine | Admitting: Internal Medicine

## 2019-03-27 DIAGNOSIS — I724 Aneurysm of artery of lower extremity: Secondary | ICD-10-CM

## 2019-03-27 DIAGNOSIS — I729 Aneurysm of unspecified site: Secondary | ICD-10-CM | POA: Diagnosis present

## 2019-03-28 ENCOUNTER — Encounter: Payer: Self-pay | Admitting: Physician Assistant

## 2019-03-28 NOTE — Progress Notes (Signed)
No sign of vascular injury, there is however a large lymph node in the area. This is better evaluated by primary care provider

## 2019-04-01 ENCOUNTER — Telehealth: Payer: Self-pay

## 2019-04-01 NOTE — Telephone Encounter (Addendum)
Left a message for the patient to give the office a call back for his ultrasound results  ----- Message from Azalee Course, [place] sent at 03/28/2019  9:34 AM EDT ----- No sign of vascular injury, there is however a large lymph node in the area. This is better evaluated by primary care provider

## 2019-04-02 ENCOUNTER — Other Ambulatory Visit: Payer: Self-pay | Admitting: Family

## 2019-04-02 DIAGNOSIS — G8929 Other chronic pain: Secondary | ICD-10-CM

## 2019-04-02 DIAGNOSIS — M5440 Lumbago with sciatica, unspecified side: Secondary | ICD-10-CM

## 2019-04-02 DIAGNOSIS — M549 Dorsalgia, unspecified: Secondary | ICD-10-CM

## 2019-04-11 ENCOUNTER — Ambulatory Visit: Payer: Medicare HMO

## 2019-04-17 NOTE — Progress Notes (Deleted)
Cardiology Office Note:    Date:  04/17/2019   ID:  Silvestre Mines, DOB [birth year], MRN [id number]  PCP:  [name]., FNP  Cardiologist:  Nanetta Batty, MD  Electrophysiologist:  None   Referring MD: [name]., FNP   No chief complaint on file.   History of Present Illness:    Troy French is a 59 y.o. male referred by Dr Allyson Sabal for consideration of CTO PCI. He has  a hx of HTN, HLD, tobacco abuse, COPD and CAD.  He also has significant family history of CAD as well.  Patient had a history of stents in [place], [place] and also in [place] in 2015.  Previous echocardiogram obtained on 12/27/2018 showed EF 60 to 65%, no regional wall motion abnormality, no significant valve issue.  Heart monitor obtained in January 2021 showed sinus rhythm, episodes of PACs and SVT, one episode of complete heart block as long as 11 seconds.  He was seen back by Dr. Gery Pray on 02/04/2019, he was referred to the electrophysiology service for further assessment.  Given episode of chest pain, Dr. Allyson Sabal eventually performed a cardiac catheterization on 02/10/2019 which demonstrated 100% proximal left circumflex occlusion, 80% ostial left circumflex lesion, widely patent mid RCA stent, 80% proximal RCA lesion treated with synergy 2.5 x 28 mm DES, EF 50 to 55%.  Postprocedure, patient was placed on aspirin and Plavix.  During the hospitalization, patient was evaluated by Dr. Graciela Husbands who felt his dizziness is more orthostatic than related to her arrhythmia, therefore no pacemaker was recommended.  However it is recommended that he revisit his obstructive sleep apnea and restart CPAP machine in the future.  He returned for follow-up on 2/17 and was evaluated by Dr. Allyson Sabal for occasional hypotension and the dizzy spell.  Diltiazem CD was cut back from 180 mg daily to 120 mg daily.  Hydrochlorothiazide was Cut back down to 12.5 mg daily.  Lisinopril was also reduced to 10 mg daily.  Toprol-XL 25 mg daily was added  to his medical regimen since his baseline heart rate was 104.   On his last visit he noted dizzy spell has improved however he continued to have intermittent dizziness and HCTZ was stopped.  I recommended discontinue hydrochlorothiazide. He had worsening right groin pain with palpation.  Rright groin ultrasound showed  no pseudoaneurysm or vascular injury but he did have an enlarged lymph node. He was having worsening chest discomfort.   Dr. Allyson Sabal recommended he be considered for PCI CTO of the LCx.  He also needs a sleep study to evaluate obstructive sleep apnea, he says it has been more than 20 years since he wore a CPAP machine.  This is for the nocturnal bradycardia and the daytime somnolence. He continued to smoke.  Past Medical History:  Diagnosis Date  . Arthritis   . CHF (congestive heart failure) (HCC)   . COPD (chronic obstructive pulmonary disease) (HCC)   . Coronary artery disease   . Hyperlipidemia   . Hypertension     Past Surgical History:  Procedure Laterality Date  . cardiac stents    . CORONARY ATHERECTOMY N/A 02/10/2019   Procedure: CORONARY ATHERECTOMY;  Surgeon: Runell Gess, MD;  Location: [facility] INVASIVE CV LAB;  Service: Cardiovascular;  Laterality: N/A;  Mid RCA  . CORONARY STENT INTERVENTION N/A 02/10/2019   Procedure: CORONARY STENT INTERVENTION;  Surgeon: Runell Gess, MD;  Location: [facility] INVASIVE CV LAB;  Service: Cardiovascular;  Laterality: N/A;  Mid  RCA  . LEFT HEART CATH AND CORONARY ANGIOGRAPHY N/A 02/10/2019   Procedure: LEFT HEART CATH AND CORONARY ANGIOGRAPHY;  Surgeon: Runell Gess, MD;  Location: [facility] INVASIVE CV LAB;  Service: Cardiovascular;  Laterality: N/A;  . TEMPORARY PACEMAKER N/A 02/10/2019   Procedure: TEMPORARY PACEMAKER;  Surgeon: Runell Gess, MD;  Location: [facility] INVASIVE CV LAB;  Service: Cardiovascular;  Laterality: N/A;    Current Medications: No outpatient medications have been marked as taking for the 04/18/19 encounter (Appointment)  with [place], Sundeep Cary M, MD.     Allergies:   Nitroglycerin   Social History   Socioeconomic History  . Marital status: Single    Spouse name: Not on file  . Number of children: Not on file  . Years of education: Not on file  . Highest education level: Not on file  Occupational History  . Not on file  Tobacco Use  . Smoking status: Current Every Day Smoker  . Smokeless tobacco: Never Used  Substance and Sexual Activity  . Alcohol use: Not Currently  . Drug use: Not on file  . Sexual activity: Not on file  Other Topics Concern  . Not on file  Social History Narrative  . Not on file   Social Determinants of Health   Financial Resource Strain:   . Difficulty of Paying Living Expenses:   Food Insecurity:   . Worried About Programme researcher, broadcasting/film/video in the Last Year:   . Barista in the Last Year:   Transportation Needs:   . Freight forwarder (Medical):   Marland Kitchen Lack of Transportation (Non-Medical):   Physical Activity:   . Days of Exercise per Week:   . Minutes of Exercise per Session:   Stress:   . Feeling of Stress :   Social Connections:   . Frequency of Communication with Friends and Family:   . Frequency of Social Gatherings with Friends and Family:   . Attends Religious Services:   . Active Member of Clubs or Organizations:   . Attends Banker Meetings:   Marland Kitchen Marital Status:      Family History: The patient's family history includes Diabetes in his mother.  ROS:   Please see the history of present illness.     All other systems reviewed and are negative.  EKGs/Labs/Other Studies Reviewed:    The following studies were reviewed today:  Echo 12/27/2018 IMPRESSIONS    1. Left ventricular ejection fraction, by visual estimation, is 60 to  65%. The left ventricle has normal function. There is no left ventricular  hypertrophy.  2. The left ventricle has no regional wall motion abnormalities.  3. Global right ventricle has normal systolic  function.The right  ventricular size is normal.  4. Left atrial size was normal.  5. Right atrial size was normal.  6. The mitral valve is normal in structure. Trivial mitral valve  regurgitation. No evidence of mitral stenosis.  7. The tricuspid valve is normal in structure. Tricuspid valve  regurgitation is trivial.  8. The aortic valve is tricuspid. Aortic valve regurgitation is not  visualized. Mild aortic valve sclerosis without stenosis.  9. The pulmonic valve was normal in structure. Pulmonic valve  regurgitation is not visualized.  10. The inferior vena cava is normal in size with greater than 50%  respiratory variability, suggesting right atrial pressure of 3 mmHg.  11. Normal LV systolic and diastolic function; trace MR and TR.    Cath 02/10/2019  Prox Cx to  Mid Cx lesion is 100% stenosed.  Ost Cx to Prox Cx lesion is 80% stenosed.  Previously placed Mid RCA to Dist RCA stent (unknown type) is widely patent.  Prox RCA lesion is 80% stenosed.  A drug-eluting stent was successfully placed using a SYNERGY XD [id number].  Post intervention, there is a 0% residual stenosis.  The left ventricular systolic function is normal.  LV end diastolic pressure is normal.  The left ventricular ejection fraction is 50-55% by visual estimate.  EKG:  EKG is ordered today.  The ekg ordered today demonstrates NSR without significant ST-T wave changes  Recent Labs: 02/07/2019: ALT 9 02/11/2019: BUN 11; Creatinine, Ser 0.90; Hemoglobin 12.6; Platelets 246; Potassium 4.4; Sodium 138  Recent Lipid Panel    Component Value Date/Time   CHOL 132 02/07/2019 0935   TRIG 126 02/07/2019 0935   HDL 38 (L) 02/07/2019 0935   CHOLHDL 3.5 02/07/2019 0935   LDLCALC 71 02/07/2019 0935    Physical Exam:    VS:  There were no vitals taken for this visit.    Wt Readings from Last 3 Encounters:  03/26/19 174 lb (78.9 kg)  02/26/19 172 lb 3.2 oz (78.1 kg)  02/11/19 176 lb 6.4 oz (80 kg)      GEN:  Well nourished, well developed in no acute distress HEENT: Normal NECK: No JVD; No carotid bruits LYMPHATICS: No lymphadenopathy CARDIAC: RRR, no murmurs, rubs, gallops RESPIRATORY:  Clear to auscultation without rales, wheezing or rhonchi  ABDOMEN: Soft, non-tender, non-distended MUSCULOSKELETAL:  No edema; No deformity  SKIN: Warm and dry NEUROLOGIC:  Alert and oriented x 3 PSYCHIATRIC:  Normal affect   ASSESSMENT:    No diagnosis found. PLAN:    In order of problems listed above:  1. CAD s/p Multiple prior PCIs. Most recently stenting of the proximal RCA with DES. The LCX has an 80% ostial stenosis followed by complete occlusion in a previously stented segment. The stent covers the proximal LCX into the first OM. There are faint collaterals to the first OM that appears small. There are collaterals to a second OM/ternimal Lcx that appears to be jailed by the prior stent.   2. Right groin pain: no PSA  3. Hypertension: Blood pressure stable on current therapy  4. Hyperlipidemia: Continue statin therapy  5. COPD: No acute exacerbation  6. Orthostatic dizziness, I will discontinue hydrochlorothiazide  7. Tobacco abuse: Tobacco cessation strongly advised  8. Obstructive sleep apnea: He has not used CPAP machine for 20 years.  I suspect his daytime somnolence is related to untreated obstructive sleep apnea, recommend a repeat sleep study.   Medication Adjustments/Labs and Tests Ordered: Current medicines are reviewed at length with the patient today.  Concerns regarding medicines are outlined above.  No orders of the defined types were placed in this encounter.  No orders of the defined types were placed in this encounter.   There are no Patient Instructions on file for this visit.   Signed, Cletus Paris Martin[place], MD  04/17/2019 7:22 AM    [facility]

## 2019-04-18 ENCOUNTER — Ambulatory Visit: Payer: Medicare HMO | Admitting: Cardiology

## 2019-04-24 ENCOUNTER — Ambulatory Visit: Payer: Medicare HMO | Admitting: Physician Assistant

## 2019-05-27 ENCOUNTER — Ambulatory Visit: Payer: Medicare HMO | Admitting: Cardiovascular Disease

## 2019-08-07 ENCOUNTER — Telehealth: Payer: Self-pay | Admitting: Cardiovascular Disease

## 2019-08-07 NOTE — Telephone Encounter (Signed)
Attempted to call pt back again but had to leave message.

## 2019-08-07 NOTE — Telephone Encounter (Signed)
Patient is returning call.  °

## 2019-08-07 NOTE — Telephone Encounter (Signed)
Patient sent an appointment request through mychart stating he was having "Labored breathing". No further information was given. Please advise. He is scheduled 08/25/2019 with Azalee Course for a virtual appointment.

## 2019-08-07 NOTE — Telephone Encounter (Signed)
LMTCB

## 2019-08-08 NOTE — Telephone Encounter (Signed)
Spoke with pt, he reports he started with congestion and was given antibiotic and steroids. The congestion is gone but his breathing is not back to normal. He reports SOB all the time and it feels like his lung capacity has decreased. He denies edema or orthopnea. He has a follow up with his medical doctor on 08/17/19 for a cxr and follow up. He denies chest pain that is different from his usual. He will have his medical doctor do an EKG and send it to [place] for his virtual visit with the APP. Pt agreed with this plan.

## 2019-08-25 ENCOUNTER — Encounter: Payer: Medicare HMO | Admitting: Physician Assistant

## 2019-08-25 ENCOUNTER — Telehealth: Payer: Self-pay

## 2019-08-25 NOTE — Telephone Encounter (Signed)
Left a voice message for the patient reminding him of his virtual visit with Azalee Course, PA-C today at 11:15 AM today and to give our office a call.

## 2019-08-25 NOTE — Progress Notes (Signed)
This encounter was created in error - please disregard.

## 2019-10-08 ENCOUNTER — Other Ambulatory Visit: Payer: Self-pay | Admitting: Family

## 2019-10-08 DIAGNOSIS — M5441 Lumbago with sciatica, right side: Secondary | ICD-10-CM

## 2019-10-17 ENCOUNTER — Encounter (HOSPITAL_COMMUNITY): Payer: Self-pay | Admitting: Emergency Medicine

## 2019-10-17 ENCOUNTER — Other Ambulatory Visit: Payer: Self-pay

## 2019-10-17 ENCOUNTER — Emergency Department (HOSPITAL_BASED_OUTPATIENT_CLINIC_OR_DEPARTMENT_OTHER): Payer: Medicare HMO

## 2019-10-17 ENCOUNTER — Emergency Department (HOSPITAL_COMMUNITY)
Admission: EM | Admit: 2019-10-17 | Discharge: 2019-10-17 | Disposition: A | Discharge status: Home / Self Care | Payer: Medicare HMO | Attending: Emergency Medicine | Admitting: Emergency Medicine

## 2019-10-17 DIAGNOSIS — M7989 Other specified soft tissue disorders: Secondary | ICD-10-CM | POA: Diagnosis not present

## 2019-10-17 DIAGNOSIS — R609 Edema, unspecified: Secondary | ICD-10-CM | POA: Diagnosis not present

## 2019-10-17 DIAGNOSIS — F17200 Nicotine dependence, unspecified, uncomplicated: Secondary | ICD-10-CM | POA: Insufficient documentation

## 2019-10-17 DIAGNOSIS — Z79899 Other long term (current) drug therapy: Secondary | ICD-10-CM | POA: Diagnosis not present

## 2019-10-17 DIAGNOSIS — G8929 Other chronic pain: Secondary | ICD-10-CM | POA: Insufficient documentation

## 2019-10-17 DIAGNOSIS — I2510 Atherosclerotic heart disease of native coronary artery without angina pectoris: Secondary | ICD-10-CM | POA: Diagnosis not present

## 2019-10-17 DIAGNOSIS — Z950 Presence of cardiac pacemaker: Secondary | ICD-10-CM | POA: Diagnosis not present

## 2019-10-17 DIAGNOSIS — M5431 Sciatica, right side: Secondary | ICD-10-CM | POA: Diagnosis not present

## 2019-10-17 DIAGNOSIS — I509 Heart failure, unspecified: Secondary | ICD-10-CM | POA: Insufficient documentation

## 2019-10-17 DIAGNOSIS — I110 Hypertensive heart disease with heart failure: Secondary | ICD-10-CM | POA: Diagnosis not present

## 2019-10-17 DIAGNOSIS — Z7901 Long term (current) use of anticoagulants: Secondary | ICD-10-CM | POA: Insufficient documentation

## 2019-10-17 DIAGNOSIS — Z7982 Long term (current) use of aspirin: Secondary | ICD-10-CM | POA: Insufficient documentation

## 2019-10-17 DIAGNOSIS — Z955 Presence of coronary angioplasty implant and graft: Secondary | ICD-10-CM | POA: Diagnosis not present

## 2019-10-17 DIAGNOSIS — J449 Chronic obstructive pulmonary disease, unspecified: Secondary | ICD-10-CM | POA: Diagnosis not present

## 2019-10-17 DIAGNOSIS — M5440 Lumbago with sciatica, unspecified side: Secondary | ICD-10-CM

## 2019-10-17 DIAGNOSIS — M79604 Pain in right leg: Secondary | ICD-10-CM

## 2019-10-17 MED ORDER — HYDROCODONE-ACETAMINOPHEN 5-325 MG PO TABS
2.0000 | ORAL_TABLET | ORAL | Status: AC
  Administered 2019-10-17: 2 via ORAL
  Filled 2019-10-17: qty 2

## 2019-10-17 NOTE — ED Provider Notes (Signed)
[facility]-EMERGENCY DEPT Provider Note   CSN: [id number] Arrival date & time: 10/17/19  1431     History Chief Complaint  Patient presents with  . Back Pain  . Leg Pain    Troy French is a 59 y.o. male.  HPI   Patient presents emergency room with complaints of right leg pain and swelling.  Patient states he has history of chronic back pain.  He has history of sciatica but normally the pain is in his left leg.  Patient saw his PCP recently and he had a shot of pain medications as well as steroids.  Patient states he has noticed increasing pain that persists in his right leg.  Patient is concerned about the possibility of a DVT as it feels swollen to him.  Denies any fevers chills or chest pain.  Past Medical History:  Diagnosis Date  . Arthritis   . CHF (congestive heart failure) (HCC)   . COPD (chronic obstructive pulmonary disease) (HCC)   . Coronary artery disease   . Hyperlipidemia   . Hypertension     Patient Active Problem List   Diagnosis Date Noted  . Sinus pause 02/11/2019  . CAD in native artery 02/10/2019  . Tobacco abuse 12/17/2018  . Essential hypertension 12/17/2018  . Hyperlipidemia 12/17/2018  . COPD (chronic obstructive pulmonary disease) (HCC) 12/17/2018  . Atypical chest pain 12/17/2018  . Palpitations 12/17/2018  . Coronary artery disease 12/17/2018    Past Surgical History:  Procedure Laterality Date  . cardiac stents    . CORONARY ATHERECTOMY N/A 02/10/2019   Procedure: CORONARY ATHERECTOMY;  Surgeon: Runell Gess, MD;  Location: [facility] INVASIVE CV LAB;  Service: Cardiovascular;  Laterality: N/A;  Mid RCA  . CORONARY STENT INTERVENTION N/A 02/10/2019   Procedure: CORONARY STENT INTERVENTION;  Surgeon: Runell Gess, MD;  Location: [facility] INVASIVE CV LAB;  Service: Cardiovascular;  Laterality: N/A;  Mid RCA  . LEFT HEART CATH AND CORONARY ANGIOGRAPHY N/A 02/10/2019   Procedure: LEFT HEART CATH AND CORONARY ANGIOGRAPHY;   Surgeon: Runell Gess, MD;  Location: [facility] INVASIVE CV LAB;  Service: Cardiovascular;  Laterality: N/A;  . TEMPORARY PACEMAKER N/A 02/10/2019   Procedure: TEMPORARY PACEMAKER;  Surgeon: Runell Gess, MD;  Location: [facility] INVASIVE CV LAB;  Service: Cardiovascular;  Laterality: N/A;       Family History  Problem Relation Age of Onset  . Diabetes Mother     Social History   Tobacco Use  . Smoking status: Current Every Day Smoker  . Smokeless tobacco: Never Used  Vaping Use  . Vaping Use: Never used  Substance Use Topics  . Alcohol use: Not Currently  . Drug use: Not on file    Home Medications Prior to Admission medications   Medication Sig Start Date End Date Taking? Authorizing Provider  albuterol (VENTOLIN HFA) 108 (90 Base) MCG/ACT inhaler Inhale 2 puffs into the lungs every 6 (six) hours as needed for wheezing or shortness of breath.    [provider]  ALPRAZolam Prudy Feeler) 0.5 MG tablet Take 0.5 mg by mouth 3 (three) times daily.    [provider]  aspirin 81 MG chewable tablet Chew 81 mg by mouth daily.     [provider]  atorvastatin (LIPITOR) 40 MG tablet Take 1 tablet (40 mg total) by mouth daily. Patient taking differently: Take 40 mg by mouth every evening.  12/17/18 03/17/19  Runell Gess, MD  clopidogrel (PLAVIX) 75 MG tablet Take 75  mg by mouth daily.    [provider]  diltiazem (CARDIZEM CD) 180 MG 24 hr capsule  03/14/19   [provider]  HYDROcodone-acetaminophen (NORCO) 10-325 MG tablet Take 1 tablet by mouth 3 (three) times daily.    [provider]  lisinopril (ZESTRIL) 10 MG tablet Take 1 tablet (10 mg total) by mouth daily. 02/26/19   Runell Gess, MD  metoprolol succinate (TOPROL-XL) 25 MG 24 hr tablet Take 1 tablet (25 mg total) by mouth daily. Take with or immediately following a meal. 02/26/19 05/27/19  Runell Gess, MD  morphine (MS CONTIN) 30 MG 12 hr tablet Take 30 mg by mouth every 12  (twelve) hours.    [provider]  nicotine (NICODERM CQ - DOSED IN MG/24 HOURS) 21 mg/24hr patch Place 1 patch (21 mg total) onto the skin daily. 02/11/19   Arty Baumgartner, NP  pantoprazole (PROTONIX) 40 MG tablet Take 1 tablet (40 mg total) by mouth daily. 02/11/19   Arty Baumgartner, NP  phenylephrine (SUDAFED PE) 10 MG TABS tablet Take 10 mg by mouth daily as needed (allergies).    [provider]  ranolazine (RANEXA) 500 MG 12 hr tablet Take 1 tablet (500 mg total) by mouth 2 (two) times daily. 03/26/19 06/24/19  Azalee Course, PA  sodium chloride (OCEAN) 0.65 % SOLN nasal spray Place 1 spray into both nostrils as needed for congestion (dryness).    [provider]  SPIRIVA RESPIMAT 2.5 MCG/ACT AERS Inhale 2 puffs into the lungs daily.  01/13/19   [provider]  tadalafil (CIALIS) 5 MG tablet Take 5 mg by mouth daily.    [provider]  terbinafine (LAMISIL) 1 % cream Apply 1 application topically daily as needed (toenail fungus).    [provider]  zolpidem (AMBIEN) 10 MG tablet Take 10 mg by mouth at bedtime.  01/27/19   [provider]    Allergies    Nitroglycerin  Review of Systems   Review of Systems  All other systems reviewed and are negative.   Physical Exam Updated Vital Signs BP 138/89 (BP Location: Left Arm)   Pulse 80   Temp 97.8 F (36.6 C) (Oral)   Resp 17   Ht 1.702 m (5\' 7" )   Wt 77.1 kg   SpO2 99%   BMI 26.63 kg/m   Physical Exam Vitals and nursing note reviewed.  Constitutional:      General: He is not in acute distress.    Appearance: He is well-developed.  HENT:     Head: Normocephalic and atraumatic.     Right Ear: External ear normal.     Left Ear: External ear normal.  Eyes:     General: No scleral icterus.       Right eye: No discharge.        Left eye: No discharge.     Conjunctiva/sclera: Conjunctivae normal.  Neck:     Trachea: No tracheal deviation.  Cardiovascular:      Rate and Rhythm: Normal rate.  Pulmonary:     Effort: Pulmonary effort is normal. No respiratory distress.     Breath sounds: No stridor.  Abdominal:     General: There is no distension.  Musculoskeletal:        General: No swelling or deformity.     Cervical back: Neck supple.     Comments: No significant swelling appreciated on exam, neurovascular intact  Skin:    General: Skin is  warm and dry.     Findings: No rash.  Neurological:     General: No focal deficit present.     Mental Status: He is alert.     Cranial Nerves: Cranial nerve deficit: no gross deficits.     Comments: Patient able to ambulate in the ED without difficulty     ED Results / Procedures / Treatments   Labs (all labs ordered are listed, but only abnormal results are displayed) Labs Reviewed - No data to display  EKG None  Radiology VAS [place] LOWER EXTREMITY VENOUS (DVT) (ONLY [facility])  Result Date: 10/17/2019  Lower Venous DVTStudy Indications: Swelling, and Edema.  Comparison Study: no prior Performing Technologist: Gertie Fey RDMS, RVT, RDCS  Examination Guidelines: A complete evaluation includes B-mode imaging, spectral Doppler, color Doppler, and power Doppler as needed of all accessible portions of each vessel. Bilateral testing is considered an integral part of a complete examination. Limited examinations for reoccurring indications may be performed as noted. The reflux portion of the exam is performed with the patient in reverse Trendelenburg.  +---------+---------------+---------+-----------+----------+--------------+ RIGHT    CompressibilityPhasicitySpontaneityPropertiesThrombus Aging +---------+---------------+---------+-----------+----------+--------------+ CFV      Full           Yes      Yes                                 +---------+---------------+---------+-----------+----------+--------------+ SFJ      Full                                                         +---------+---------------+---------+-----------+----------+--------------+ FV Prox  Full                                                        +---------+---------------+---------+-----------+----------+--------------+ FV Mid   Full                                                        +---------+---------------+---------+-----------+----------+--------------+ FV DistalFull                                                        +---------+---------------+---------+-----------+----------+--------------+ PFV      Full                                                        +---------+---------------+---------+-----------+----------+--------------+ POP      Full           Yes      Yes                                 +---------+---------------+---------+-----------+----------+--------------+  PTV      Full                                                        +---------+---------------+---------+-----------+----------+--------------+ PERO     Full                                                        +---------+---------------+---------+-----------+----------+--------------+   +----+---------------+---------+-----------+----------+--------------+ LEFTCompressibilityPhasicitySpontaneityPropertiesThrombus Aging +----+---------------+---------+-----------+----------+--------------+ CFV Full           Yes      Yes                                 +----+---------------+---------+-----------+----------+--------------+     Summary: RIGHT: - There is no evidence of deep vein thrombosis in the lower extremity.  - No cystic structure found in the popliteal fossa.   *See table(s) above for measurements and observations. Electronically signed by Lemar LivingsBrandon Cain MD on 10/17/2019 at 5:29:41 PM.    Final     Procedures Procedures (including critical care time)  Medications Ordered in ED Medications  HYDROcodone-acetaminophen (NORCO/VICODIN) 5-325 MG per tablet 2  tablet (2 tablets Oral Given 10/17/19 1651)    ED Course  I have reviewed the triage vital signs and the nursing notes.  Pertinent labs & imaging results that were available during my care of the patient were reviewed by me and considered in my medical decision making (see chart for details).  Clinical Course as of Oct 17 1742  Fri Oct 17, 2019  1728 DVT study is negative   [JK]    Clinical Course User Index [JK] Linwood DibblesKnapp, Indiana Pechacek, MD   MDM Rules/Calculators/A&P                          Patient presented to the ED with complaints of leg pain.  Patient has history of sciatica but he was concerned about the possibility of DVT with his new right-sided pain.  On exam the patient did not have signs of vascular compromise.  He was neurovascularly intact.  Patient felt like his leg was swollen although I did not see any appreciable swelling.  Doppler study is negative.  I suspect his symptoms are related to his radicular back pain.  Patient gets regular prescriptions of heart hydrocodone and morphine extended relief.  Last prescribed middle of last month Final Clinical Impression(s) / ED Diagnoses Final diagnoses:  Right leg pain  Chronic low back pain with sciatica, sciatica laterality unspecified, unspecified back pain laterality    Rx / DC Orders ED Discharge Orders    None       Linwood DibblesKnapp, Melinda Gwinner, MD 10/17/19 1745

## 2019-10-17 NOTE — Progress Notes (Signed)
Lower extremity venous has been completed.   Preliminary results in CV Proc.   Blanch Media 10/17/2019 5:20 PM

## 2019-10-17 NOTE — ED Triage Notes (Signed)
Per pt, states he has chronic back pain with sciatica-states pain usually radiated down left left-having pain sown the front of right leg-has been getting injections in back and has been on a course of oral steroids by PCP-has MRI scheduled for the end of the month-states he is worried about a possible DVT

## 2019-10-17 NOTE — Discharge Instructions (Addendum)
Continue your current medications.  Follow-up with your doctor managing your back pain.  The ultrasound today did not show any signs of blood clots.

## 2019-10-21 ENCOUNTER — Emergency Department (HOSPITAL_COMMUNITY): Payer: Medicare HMO

## 2019-10-21 ENCOUNTER — Other Ambulatory Visit: Payer: Self-pay

## 2019-10-21 ENCOUNTER — Emergency Department (HOSPITAL_COMMUNITY)
Admission: EM | Admit: 2019-10-21 | Discharge: 2019-10-21 | Disposition: A | Discharge status: Home / Self Care | Payer: Medicare HMO | Attending: Emergency Medicine | Admitting: Emergency Medicine

## 2019-10-21 ENCOUNTER — Encounter (HOSPITAL_COMMUNITY): Payer: Self-pay

## 2019-10-21 DIAGNOSIS — I509 Heart failure, unspecified: Secondary | ICD-10-CM | POA: Diagnosis not present

## 2019-10-21 DIAGNOSIS — F1123 Opioid dependence with withdrawal: Secondary | ICD-10-CM | POA: Insufficient documentation

## 2019-10-21 DIAGNOSIS — I2510 Atherosclerotic heart disease of native coronary artery without angina pectoris: Secondary | ICD-10-CM | POA: Insufficient documentation

## 2019-10-21 DIAGNOSIS — Z79899 Other long term (current) drug therapy: Secondary | ICD-10-CM | POA: Insufficient documentation

## 2019-10-21 DIAGNOSIS — M25551 Pain in right hip: Secondary | ICD-10-CM | POA: Diagnosis not present

## 2019-10-21 DIAGNOSIS — I110 Hypertensive heart disease with heart failure: Secondary | ICD-10-CM | POA: Insufficient documentation

## 2019-10-21 DIAGNOSIS — G8929 Other chronic pain: Secondary | ICD-10-CM | POA: Insufficient documentation

## 2019-10-21 DIAGNOSIS — F17210 Nicotine dependence, cigarettes, uncomplicated: Secondary | ICD-10-CM | POA: Diagnosis not present

## 2019-10-21 DIAGNOSIS — Z951 Presence of aortocoronary bypass graft: Secondary | ICD-10-CM | POA: Diagnosis not present

## 2019-10-21 DIAGNOSIS — R079 Chest pain, unspecified: Secondary | ICD-10-CM | POA: Diagnosis present

## 2019-10-21 DIAGNOSIS — Z7982 Long term (current) use of aspirin: Secondary | ICD-10-CM | POA: Insufficient documentation

## 2019-10-21 DIAGNOSIS — J449 Chronic obstructive pulmonary disease, unspecified: Secondary | ICD-10-CM | POA: Diagnosis not present

## 2019-10-21 DIAGNOSIS — F1193 Opioid use, unspecified with withdrawal: Secondary | ICD-10-CM

## 2019-10-21 LAB — CBC
HCT: 48.9 % (ref 39.0–52.0)
Hemoglobin: 17.1 g/dL — ABNORMAL HIGH (ref 13.0–17.0)
MCH: 32 pg (ref 26.0–34.0)
MCHC: 35 g/dL (ref 30.0–36.0)
MCV: 91.6 fL (ref 80.0–100.0)
Platelets: 342 10*3/uL (ref 150–400)
RBC: 5.34 MIL/uL (ref 4.22–5.81)
RDW: 12.5 % (ref 11.5–15.5)
WBC: 17 10*3/uL — ABNORMAL HIGH (ref 4.0–10.5)
nRBC: 0 % (ref 0.0–0.2)

## 2019-10-21 LAB — BASIC METABOLIC PANEL
Anion gap: 15 (ref 5–15)
BUN: 17 mg/dL (ref 6–20)
CO2: 16 mmol/L — ABNORMAL LOW (ref 22–32)
Calcium: 9.5 mg/dL (ref 8.9–10.3)
Chloride: 98 mmol/L (ref 98–111)
Creatinine, Ser: 1.05 mg/dL (ref 0.61–1.24)
GFR, Estimated: >60 mL/min (ref >60)
Glucose, Bld: 139 mg/dL — ABNORMAL HIGH (ref 70–99)
Potassium: 3.8 mmol/L (ref 3.5–5.1)
Sodium: 129 mmol/L — ABNORMAL LOW (ref 135–145)

## 2019-10-21 LAB — TROPONIN I (HIGH SENSITIVITY)
Troponin I (High Sensitivity): 3 ng/L (ref <18)
Troponin I (High Sensitivity): 4 ng/L (ref <18)

## 2019-10-21 MED ORDER — PROMETHAZINE HCL 25 MG PO TABS: 25.0000 mg | ORAL_TABLET | Freq: Three times a day (TID) | ORAL | 0 refills | Status: DC | PRN

## 2019-10-21 MED ORDER — ONDANSETRON HCL 4 MG/2ML IJ SOLN
4.0000 mg | Freq: Once | INTRAMUSCULAR | Status: AC
  Administered 2019-10-21: 4 mg via INTRAVENOUS
  Filled 2019-10-21: qty 2

## 2019-10-21 MED ORDER — LOPERAMIDE HCL 2 MG PO CAPS: 2.0000 mg | ORAL_CAPSULE | Freq: Four times a day (QID) | ORAL | 0 refills | Status: DC | PRN

## 2019-10-21 MED ORDER — ONDANSETRON 4 MG PO TBDP: 4.0000 mg | ORAL_TABLET | Freq: Three times a day (TID) | ORAL | 0 refills | Status: DC | PRN

## 2019-10-21 MED ORDER — METHOCARBAMOL 500 MG PO TABS: 500.0000 mg | ORAL_TABLET | Freq: Three times a day (TID) | ORAL | 0 refills | Status: DC | PRN

## 2019-10-21 MED ORDER — SODIUM CHLORIDE 0.9 % IV BOLUS
500.0000 mL | Freq: Once | INTRAVENOUS | Status: AC
  Administered 2019-10-21: 500 mL via INTRAVENOUS

## 2019-10-21 MED ORDER — KETOROLAC TROMETHAMINE 30 MG/ML IJ SOLN
15.0000 mg | Freq: Once | INTRAMUSCULAR | Status: AC
  Administered 2019-10-21: 15 mg via INTRAVENOUS
  Filled 2019-10-21: qty 1

## 2019-10-21 NOTE — ED Notes (Signed)
ED Provider at bedside. 

## 2019-10-21 NOTE — ED Provider Notes (Signed)
[facility]-EMERGENCY DEPT Provider Note   CSN: [id number] Arrival date & time: 10/21/19  1514     History Chief Complaint  Patient presents with  . Chest Pain  . Medication Refill    Troy French is a 59 y.o. male.  HPI Patient presents with chest pain and opiate withdrawal.  States that he was having increasing pain in his right hip because that was taking more his chronic pain medicine.  Ran out about 4 days early.  Is due to get refilled on Friday.  States being off his medicine he is developed nausea vomiting and diarrhea.  States he is been able to keep his medicines down.  States he also developed sharp anterior chest pain.  Has had previous coronary stents.  Pain is sharp and comes and goes.  Not exertional.  Does not go to the jaw.  States he feels bad all over.  He was hoping to get a refill of his pain medicines also.    Past Medical History:  Diagnosis Date  . Arthritis   . CHF (congestive heart failure) (HCC)   . COPD (chronic obstructive pulmonary disease) (HCC)   . Coronary artery disease   . Hyperlipidemia   . Hypertension     Patient Active Problem List   Diagnosis Date Noted  . Sinus pause 02/11/2019  . CAD in native artery 02/10/2019  . Tobacco abuse 12/17/2018  . Essential hypertension 12/17/2018  . Hyperlipidemia 12/17/2018  . COPD (chronic obstructive pulmonary disease) (HCC) 12/17/2018  . Atypical chest pain 12/17/2018  . Palpitations 12/17/2018  . Coronary artery disease 12/17/2018    Past Surgical History:  Procedure Laterality Date  . cardiac stents    . CORONARY ATHERECTOMY N/A 02/10/2019   Procedure: CORONARY ATHERECTOMY;  Surgeon: Runell Gess, MD;  Location[facility] INVASIVE CV LAB;  Service: Cardiovascular;  Laterality: N/A;  Mid RCA  . CORONARY STENT INTERVENTION N/A 02/10/2019   Procedure: CORONARY STENT INTERVENTION;  Surgeon: Runell Gess, MD;  Location: [facility] INVASIVE CV LAB;  Service: Cardiovascular;   Laterality: N/A;  Mid RCA  . LEFT HEART CATH AND CORONARY ANGIOGRAPHY N/A 02/10/2019   Procedure: LEFT HEART CATH AND CORONARY ANGIOGRAPHY;  Surgeon: Runell Gess, MD;  Location: [facility] INVASIVE CV LAB;  Service: Cardiovascular;  Laterality: N/A;  . TEMPORARY PACEMAKER N/A 02/10/2019   Procedure: TEMPORARY PACEMAKER;  Surgeon: Runell Gess, MD;  Location: [facility] INVASIVE CV LAB;  Service: Cardiovascular;  Laterality: N/A;       Family History  Problem Relation Age of Onset  . Diabetes Mother     Social History   Tobacco Use  . Smoking status: Current Every Day Smoker    Packs/day: 1.00    Types: Cigarettes  . Smokeless tobacco: Never Used  Vaping Use  . Vaping Use: Never used  Substance Use Topics  . Alcohol use: Not Currently  . Drug use: Not on file    Home Medications Prior to Admission medications   Medication Sig Start Date End Date Taking? Authorizing Provider  albuterol (VENTOLIN HFA) 108 (90 Base) MCG/ACT inhaler Inhale 2 puffs into the lungs every 6 (six) hours as needed for wheezing or shortness of breath.    [provider]  ALPRAZolam Prudy Feeler) 0.5 MG tablet Take 0.5 mg by mouth 3 (three) times daily.    [provider]  aspirin 81 MG chewable tablet Chew 81 mg by mouth daily.     [provider]  atorvastatin (LIPITOR)  40 MG tablet Take 1 tablet (40 mg total) by mouth daily. Patient taking differently: Take 40 mg by mouth every evening.  12/17/18 03/17/19  Runell Gess, MD  clopidogrel (PLAVIX) 75 MG tablet Take 75 mg by mouth daily.    [provider]  diltiazem (CARDIZEM CD) 180 MG 24 hr capsule  03/14/19   [provider]  HYDROcodone-acetaminophen (NORCO) 10-325 MG tablet Take 1 tablet by mouth 3 (three) times daily.    [provider]  lisinopril (ZESTRIL) 10 MG tablet Take 1 tablet (10 mg total) by mouth daily. 02/26/19   Runell Gess, MD  metoprolol succinate (TOPROL-XL) 25 MG 24 hr tablet Take 1 tablet  (25 mg total) by mouth daily. Take with or immediately following a meal. 02/26/19 05/27/19  Runell Gess, MD  morphine (MS CONTIN) 30 MG 12 hr tablet Take 30 mg by mouth every 12 (twelve) hours.    [provider]  nicotine (NICODERM CQ - DOSED IN MG/24 HOURS) 21 mg/24hr patch Place 1 patch (21 mg total) onto the skin daily. 02/11/19   Arty Baumgartner, NP  pantoprazole (PROTONIX) 40 MG tablet Take 1 tablet (40 mg total) by mouth daily. 02/11/19   Arty Baumgartner, NP  phenylephrine (SUDAFED PE) 10 MG TABS tablet Take 10 mg by mouth daily as needed (allergies).    [provider]  ranolazine (RANEXA) 500 MG 12 hr tablet Take 1 tablet (500 mg total) by mouth 2 (two) times daily. 03/26/19 06/24/19  Azalee Course, PA  sodium chloride (OCEAN) 0.65 % SOLN nasal spray Place 1 spray into both nostrils as needed for congestion (dryness).    [provider]  SPIRIVA RESPIMAT 2.5 MCG/ACT AERS Inhale 2 puffs into the lungs daily.  01/13/19   [provider]  tadalafil (CIALIS) 5 MG tablet Take 5 mg by mouth daily.    [provider]  terbinafine (LAMISIL) 1 % cream Apply 1 application topically daily as needed (toenail fungus).    [provider]  zolpidem (AMBIEN) 10 MG tablet Take 10 mg by mouth at bedtime.  01/27/19   [provider]    Allergies    Nitroglycerin  Review of Systems   Review of Systems  Constitutional: Positive for appetite change.  HENT: Negative for congestion.   Cardiovascular: Positive for chest pain.  Gastrointestinal: Positive for diarrhea, nausea and vomiting.  Genitourinary: Negative for flank pain.  Musculoskeletal: Positive for myalgias.  Skin: Negative for rash.  Neurological: Negative for weakness.  Psychiatric/Behavioral: Negative for confusion.    Physical Exam Updated Vital Signs BP (!) 149/96 (BP Location: Right Arm)   Pulse 97   Temp 98.9 F (37.2 C) (Oral)   Resp 14   Ht 5\' 7"  (1.702 m)   Wt 77.1  kg   SpO2 100%   BMI 26.63 kg/m   Physical Exam Vitals and nursing note reviewed.  HENT:     Head: Normocephalic.  Cardiovascular:     Rate and Rhythm: Regular rhythm.  Pulmonary:     Breath sounds: No wheezing, rhonchi or rales.  Abdominal:     Tenderness: There is no abdominal tenderness.  Musculoskeletal:     Right lower leg: No edema.     Left lower leg: No edema.  Skin:    General: Skin is warm.     Capillary Refill: Capillary refill takes less than 2 seconds.     Comments: Piloerection.  Neurological:     Mental Status:  He is alert and oriented to person, place, and time.     ED Results / Procedures / Treatments   Labs (all labs ordered are listed, but only abnormal results are displayed) Labs Reviewed  BASIC METABOLIC PANEL - Abnormal; Notable for the following components:      Result Value   Sodium 129 (*)    CO2 16 (*)    Glucose, Bld 139 (*)    All other components within normal limits  CBC - Abnormal; Notable for the following components:   WBC 17.0 (*)    Hemoglobin 17.1 (*)    All other components within normal limits  TROPONIN I (HIGH SENSITIVITY)  TROPONIN I (HIGH SENSITIVITY)    EKG EKG Interpretation  Date/Time:  Tuesday October 21 2019 19:34:49 EDT Ventricular Rate:  101 PR Interval:    QRS Duration: 84 QT Interval:  347 QTC Calculation: 450 R Axis:   38 Text Interpretation: Sinus tachycardia Confirmed by Benjiman Core [phone]) on 10/21/2019 7:37:38 PM   Radiology DG Chest 2 View  Result Date: 10/21/2019 CLINICAL DATA:  Chest pain. Additional history provided: Patient reports left anterior chest pain today, previous cardiac stents, nausea/vomiting for 3 days. EXAM: CHEST - 2 VIEW COMPARISON:  No pertinent prior exams are available for comparison. FINDINGS: Heart size within normal limits. Prior coronary artery stenting. Aortic atherosclerosis. There is no appreciable airspace consolidation. No evidence of pleural effusion or  pneumothorax. No acute bony abnormality identified. IMPRESSION: No evidence of acute cardiopulmonary abnormality. Aortic Atherosclerosis (ICD10-I70.0). Prior coronary artery stenting. Electronically Signed   By: Jackey Loge DO   On: 10/21/2019 16:03    Procedures Procedures (including critical care time)  Medications Ordered in ED Medications  ondansetron (ZOFRAN) injection 4 mg (has no administration in time range)  sodium chloride 0.9 % bolus 500 mL (has no administration in time range)    ED Course  I have reviewed the triage vital signs and the nursing notes.  Pertinent labs & imaging results that were available during my care of the patient were reviewed by me and considered in my medical decision making (see chart for details).    MDM Rules/Calculators/A&P                          Patient with chest pain and opioid withdrawal.  Finishes medicines early due to increased pain in right hip.  Been out of his opiates for a couple days now.  Has had nausea vomiting diarrhea.  Also developed some upper chest pain.  Sharp.  Not really like previous angina.  Not exertional.  EKG reassuring.  Troponin negative x2.  Feels better after IV fluids and symptomatic treatment.  Will discharge home with symptomatic treatment for the withdrawal.  Follow-up as needed. Final Clinical Impression(s) / ED Diagnoses Final diagnoses:  None    Rx / DC Orders ED Discharge Orders    None       Benjiman Core, MD 10/21/19 2149

## 2019-10-21 NOTE — ED Triage Notes (Addendum)
Patient c/o mid chest pain x 2 hours.  Patient states he has been out of his Morphine and Hydrocodone tabs x 3 days. Patient states he has been taking extra because he has had increased pain in his right hip and right leg since last week. Patient also c/o nausea x 2 days.  Patient stated during triage that his chest was not hurting at all right now.

## 2019-10-21 NOTE — ED Notes (Signed)
Patient ambulatory to the lobby with a steady gait and belongings. 

## 2019-10-28 NOTE — Progress Notes (Signed)
Patient referred by Sonia Side., FNP for coronary artery disease  Subjective:   Troy French, male    DOB: 1960/02/03, 59 y.o.   MRN: [id number]   Chief Complaint  Patient presents with  . Hypertension  . Congestive Heart Failure  . Coronary Artery Disease  . New Patient (Initial Visit)     HPI  59 y.o. Caucasian male with hypertension, hyperlipdiemia, COPD, tobacco dependence, CAD w/multiple prior MI's and stents, tobacco dependence, PSVT, asymptomatic 11 sec pause (noted on monitor).  Patient moved to [place] in 2020. Prior to that, he lived in [place], [place]. He has had multiple prior MI's and stents, placed in [place], [place], [place]. He established care with Dr. Gwenlyn Found since moving to [place]. He underwent prox CA atherectomy and stenting in 02/2018 for comp;aints of chest pain. He also had PSVT and 11 sec pause, without any syncope. EP felt pacemaker was not indicated.   He has now sought to establish cardiac care with me. He denies chest pain, shortness of breath, palpitations, leg edema, orthopnea, PND, TIA/syncope. His biggest complaint is lower back pain and leg pain on ambulation. He is now seeking pain management for the same.  He does not take Aspirin, as he tells me. He also takes plavix. He denies nay bleeding issues. He also takes meloxicam for back pain.   Unfortunately, he continues to smoke 1 PPD. He has tried nicotine gum, patch, and Wellbutrin before. While the first "did not work", Wellbutrin caused him to have suicidal thoughts. He is not willing to try chantix, due to his experience with Wellbutrin.   External records reviewed and independently interpreted.     Past Medical History:  Diagnosis Date  . Arthritis   . CHF (congestive heart failure) ([place])   . COPD (chronic obstructive pulmonary disease) ([place])   . Coronary artery disease   . Hyperlipidemia   . Hypertension      Past Surgical History:  Procedure Laterality  Date  . cardiac stents    . CORONARY ATHERECTOMY N/A 02/10/2019   Procedure: CORONARY ATHERECTOMY;  Surgeon: Lorretta Harp, MD;  Location: [facility] CV LAB;  Service: Cardiovascular;  Laterality: N/A;  Mid RCA  . CORONARY STENT INTERVENTION N/A 02/10/2019   Procedure: CORONARY STENT INTERVENTION;  Surgeon: Lorretta Harp, MD;  Location: [facility] CV LAB;  Service: Cardiovascular;  Laterality: N/A;  Mid RCA  . LEFT HEART CATH AND CORONARY ANGIOGRAPHY N/A 02/10/2019   Procedure: LEFT HEART CATH AND CORONARY ANGIOGRAPHY;  Surgeon: Lorretta Harp, MD;  Location: [facility] CV LAB;  Service: Cardiovascular;  Laterality: N/A;  . TEMPORARY PACEMAKER N/A 02/10/2019   Procedure: TEMPORARY PACEMAKER;  Surgeon: Lorretta Harp, MD;  Location: [facility] CV LAB;  Service: Cardiovascular;  Laterality: N/A;     Social History   Tobacco Use  Smoking Status Current Every Day Smoker  . Packs/day: 1.00  . Types: Cigarettes  Smokeless Tobacco Never Used    Social History   Substance and Sexual Activity  Alcohol Use Not Currently     Family History  Problem Relation Age of Onset  . Diabetes Mother      Current Outpatient Medications on File Prior to Visit  Medication Sig Dispense Refill  . albuterol (VENTOLIN HFA) 108 (90 Base) MCG/ACT inhaler Inhale 2 puffs into the lungs every 6 (six) hours as needed for wheezing or shortness of breath.    Marland Kitchen aspirin 81 MG chewable tablet Chew  81 mg by mouth daily.     Marland Kitchen atorvastatin (LIPITOR) 20 MG tablet Take 20 mg by mouth daily.    . clopidogrel (PLAVIX) 75 MG tablet Take 75 mg by mouth daily.    Marland Kitchen diltiazem (DILACOR XR) 120 MG 24 hr capsule Take 120 mg by mouth daily.    Marland Kitchen HYDROcodone-acetaminophen (NORCO) 10-325 MG tablet Take 1 tablet by mouth 3 (three) times daily.    Marland Kitchen lisinopril (ZESTRIL) 10 MG tablet Take 1 tablet (10 mg total) by mouth daily. 90 tablet 3  . loperamide (IMODIUM) 2 MG capsule Take 1 capsule (2 mg total) by mouth 4 (four)  times daily as needed for diarrhea or loose stools. 8 capsule 0  . meloxicam (MOBIC) 15 MG tablet Take 15 mg by mouth daily.    . metoprolol succinate (TOPROL-XL) 50 MG 24 hr tablet Take 50 mg by mouth daily. Take with or immediately following a meal.    . morphine (MS CONTIN) 30 MG 12 hr tablet Take 30 mg by mouth every 12 (twelve) hours.    . ondansetron (ZOFRAN-ODT) 4 MG disintegrating tablet Take 1 tablet (4 mg total) by mouth every 8 (eight) hours as needed for nausea or vomiting. 8 tablet 0  . pantoprazole (PROTONIX) 40 MG tablet Take 1 tablet (40 mg total) by mouth daily. 30 tablet 1  . phenylephrine (SUDAFED PE) 10 MG TABS tablet Take 10 mg by mouth daily as needed (allergies).    . promethazine (PHENERGAN) 25 MG tablet Take 1 tablet (25 mg total) by mouth every 8 (eight) hours as needed for nausea. 8 tablet 0  . ranolazine (RANEXA) 500 MG 12 hr tablet Take 1 tablet (500 mg total) by mouth 2 (two) times daily. 90 tablet 2  . sodium chloride (OCEAN) 0.65 % SOLN nasal spray Place 1 spray into both nostrils as needed for congestion (dryness).    . SPIRIVA RESPIMAT 2.5 MCG/ACT AERS Inhale 2 puffs into the lungs daily.     . tadalafil (CIALIS) 5 MG tablet Take 5 mg by mouth daily.    Marland Kitchen zolpidem (AMBIEN) 10 MG tablet Take 10 mg by mouth at bedtime.      No current facility-administered medications on file prior to visit.    Cardiovascular and other pertinent studies:  EKG 10/29/2019: Sinus rhythm 82 bpm  Normal EKG  EKG 03/26/2019: Normal  Coronary intervention 02/2019: LM: Normal LAD: Normal LCx: Prox stent, totally occluded RCA: Patent mid-distal RCA stents           Atherectomy and stent placement prox RCA Synergy DES 2.5X28 mm   Echocardiogram 12/27/2018:  Normal size LV. LVEF 60-65%. No significant valvular abnormality.   Recent labs: 10/21/2019: Glucose 139, BUN/Cr 17/1.05. EGFR >60. Na/K 129/3.8.  H/H 17/48. MCV 91. Platelets 342 Trop HS 3, 4  01/2019: Chol 132, TG  126, HDL 38, LDL 71   Review of Systems  Cardiovascular: Negative for chest pain, dyspnea on exertion, leg swelling, palpitations and syncope.  Musculoskeletal: Positive for back pain.         Vitals:   10/29/19 0959  BP: (!) 144/86  Pulse: 80  Resp: 16  SpO2: 97%     Body mass index is 27.1 kg/m. Filed Weights   10/29/19 0959  Weight: 173 lb (78.5 kg)     Objective:   Physical Exam Vitals and nursing note reviewed.  Constitutional:      General: He is not in acute distress. Neck:  Vascular: No JVD.  Cardiovascular:     Rate and Rhythm: Normal rate and regular rhythm.     Pulses: Normal pulses.     Heart sounds: Normal heart sounds. No murmur heard.   Pulmonary:     Effort: Pulmonary effort is normal.     Breath sounds: Normal breath sounds. No wheezing or rales.         Assessment & Recommendations:   59 y.o. Caucasian male with hypertension, hyperlipdiemia, COPD, tobacco dependence, CAD w/multiple prior MI's and stents, tobacco dependence, PSVT, asymptomatic 11 sec pause (noted on monitor).  CAD:  No angina symptoms. No indication for DPAT >6 months out of stenting for stable CAD. He is not taking Aspiring. Therefore, it is okay to continue plavix. Recommend avoiding use of meloxicam.  Continue current anti-anginal therapy, statin.   H/o sinus pause: No symptoms. No indication for pacemaker at this time.  H/o PSVT: No symptoms. Continue metoprolol succinate.  Tobacco dependence: Tobacco cessation counseling:  - Currently smoking 1 packs/day   - Patient was informed of the dangers of tobacco abuse including stroke, cancer, and MI, as well as benefits of tobacco cessation. - Patient is willing to quit at this time. - Approximately 5 mins were spent counseling patient cessation techniques. We discussed various methods to help quit smoking, including deciding on a date to quit, joining a support group, pharmacological agents. Patient would like  to try on hiw own, and does not want to use pharmacological therapy at this time. - I will reassess his progress at the next follow-up visit  Leg pain: Likely neurogenic claudication, radiculopathy  F/u in 6 months  Thank you for referring the patient to [place]. Please feel free to contact with any questions.   Nigel Mormon, MD Pager: [phone] Office: [phone]

## 2019-10-29 ENCOUNTER — Encounter: Payer: Self-pay | Admitting: Cardiology

## 2019-10-29 ENCOUNTER — Ambulatory Visit: Payer: Medicare HMO | Admitting: Cardiology

## 2019-10-29 ENCOUNTER — Other Ambulatory Visit: Payer: Self-pay

## 2019-10-29 VITALS — BP 144/86 | HR 80 | Resp 16 | Ht 67.0 in | Wt 173.0 lb

## 2019-10-29 DIAGNOSIS — I25119 Atherosclerotic heart disease of native coronary artery with unspecified angina pectoris: Secondary | ICD-10-CM

## 2019-11-05 ENCOUNTER — Ambulatory Visit
Admission: RE | Admit: 2019-11-05 | Discharge: 2019-11-05 | Disposition: A | Discharge status: Home / Self Care | Payer: Medicare HMO | Source: Ambulatory Visit | Attending: Family | Admitting: Family

## 2019-11-05 DIAGNOSIS — M545 Low back pain, unspecified: Secondary | ICD-10-CM

## 2019-11-05 DIAGNOSIS — M5441 Lumbago with sciatica, right side: Secondary | ICD-10-CM

## 2019-11-05 DIAGNOSIS — G8929 Other chronic pain: Secondary | ICD-10-CM

## 2019-11-05 DIAGNOSIS — M5440 Lumbago with sciatica, unspecified side: Secondary | ICD-10-CM

## 2020-01-26 ENCOUNTER — Ambulatory Visit: Payer: Medicare HMO

## 2020-01-27 ENCOUNTER — Ambulatory Visit: Payer: Medicare HMO

## 2020-04-28 ENCOUNTER — Encounter: Payer: Self-pay | Admitting: Cardiology

## 2020-04-28 ENCOUNTER — Other Ambulatory Visit: Payer: Self-pay

## 2020-04-28 ENCOUNTER — Ambulatory Visit: Payer: Medicare HMO | Admitting: Cardiology

## 2020-04-28 VITALS — BP 130/86 | HR 87 | Temp 98.0°F | Resp 17 | Ht 67.0 in | Wt 182.2 lb

## 2020-04-28 DIAGNOSIS — I25119 Atherosclerotic heart disease of native coronary artery with unspecified angina pectoris: Secondary | ICD-10-CM

## 2020-04-28 DIAGNOSIS — I10 Essential (primary) hypertension: Secondary | ICD-10-CM

## 2020-04-28 DIAGNOSIS — F17210 Nicotine dependence, cigarettes, uncomplicated: Secondary | ICD-10-CM | POA: Insufficient documentation

## 2020-04-28 MED ORDER — CLOPIDOGREL BISULFATE 75 MG PO TABS: 75.0000 mg | ORAL_TABLET | Freq: Every day | ORAL | 3 refills | Status: AC

## 2020-04-28 MED ORDER — RANOLAZINE ER 500 MG PO TB12: 500.0000 mg | ORAL_TABLET | Freq: Two times a day (BID) | ORAL | 2 refills | Status: DC

## 2020-04-28 MED ORDER — CHANTIX STARTING MONTH PAK 0.5 MG X 11 & 1 MG X 42 PO TABS: ORAL_TABLET | ORAL | 0 refills | Status: DC

## 2020-04-28 MED ORDER — CLOPIDOGREL BISULFATE 75 MG PO TABS: 75.0000 mg | ORAL_TABLET | Freq: Every day | ORAL | 3 refills | Status: DC

## 2020-04-28 NOTE — Progress Notes (Signed)
Patient referred by [name]., FNP for coronary artery disease  Subjective:   Troy French, male    DOB: 17-Apr-1960, 60 y.o.   MRN: [id number]   Chief Complaint  Patient presents with  . Coronary artery disease involving native coronary artery of    6 MONTH    HPI  60 y.o. Caucasian male with hypertension, hyperlipdiemia, COPD, tobacco dependence, CAD w/multiple prior MI's and stents, tobacco dependence, PSVT, asymptomatic 11 sec pause (noted on monitor).  Patient has been out of plavix for last 2-3 days. He has also not been taking Ranexa. He has had episdoes of chest pain lasting up to 10 min both at rest and exertion.   Initial consultation HPI 10/2019: Patient moved to [place] in 2020. Prior to that, he lived in [place] and [place]. He has had multiple prior MI's and stents, placed in [place], [place], [place]. He established care with Dr. Allyson Sabal since moving to [place]. He underwent prox CA atherectomy and stenting in 02/2018 for comp;aints of chest pain. He also had PSVT and 11 sec pause, without any syncope. EP felt pacemaker was not indicated.   He has now sought to establish cardiac care with me. He denies chest pain, shortness of breath, palpitations, leg edema, orthopnea, PND, TIA/syncope. His biggest complaint is lower back pain and leg pain on ambulation. He is now seeking pain management for the same.  He does not take Aspirin, as he tells me. He also takes plavix. He denies nay bleeding issues. He also takes meloxicam for back pain.   Unfortunately, he continues to smoke 1 PPD. He has tried nicotine gum, patch, and Wellbutrin before. While the first "did not work", Wellbutrin caused him to have suicidal thoughts. He is not willing to try chantix, due to his experience with Wellbutrin.   External records reviewed and independently interpreted.     Current Outpatient Medications on File Prior to Visit  Medication Sig Dispense Refill  .  albuterol (VENTOLIN HFA) 108 (90 Base) MCG/ACT inhaler Inhale 2 puffs into the lungs every 6 (six) hours as needed for wheezing or shortness of breath.    Marland Kitchen atorvastatin (LIPITOR) 20 MG tablet Take 20 mg by mouth daily.    . clopidogrel (PLAVIX) 75 MG tablet Take 75 mg by mouth daily.    Marland Kitchen diltiazem (DILACOR XR) 120 MG 24 hr capsule Take 120 mg by mouth daily.    Marland Kitchen HYDROcodone-acetaminophen (NORCO) 10-325 MG tablet Take 1 tablet by mouth 3 (three) times daily.    Marland Kitchen lisinopril (ZESTRIL) 10 MG tablet Take 1 tablet (10 mg total) by mouth daily. 90 tablet 3  . loperamide (IMODIUM) 2 MG capsule Take 1 capsule (2 mg total) by mouth 4 (four) times daily as needed for diarrhea or loose stools. 8 capsule 0  . meloxicam (MOBIC) 15 MG tablet Take 15 mg by mouth daily.    . metoprolol succinate (TOPROL-XL) 50 MG 24 hr tablet Take 50 mg by mouth daily. Take with or immediately following a meal.    . morphine (MS CONTIN) 30 MG 12 hr tablet Take 30 mg by mouth every 12 (twelve) hours.    . ondansetron (ZOFRAN-ODT) 4 MG disintegrating tablet Take 1 tablet (4 mg total) by mouth every 8 (eight) hours as needed for nausea or vomiting. 8 tablet 0  . pantoprazole (PROTONIX) 40 MG tablet Take 1 tablet (40 mg total) by mouth daily. 30 tablet 1  . phenylephrine (SUDAFED PE) 10  MG TABS tablet Take 10 mg by mouth daily as needed (allergies).    . promethazine (PHENERGAN) 25 MG tablet Take 1 tablet (25 mg total) by mouth every 8 (eight) hours as needed for nausea. 8 tablet 0  . ranolazine (RANEXA) 500 MG 12 hr tablet Take 1 tablet (500 mg total) by mouth 2 (two) times daily. 90 tablet 2  . sodium chloride (OCEAN) 0.65 % SOLN nasal spray Place 1 spray into both nostrils as needed for congestion (dryness).    . SPIRIVA RESPIMAT 2.5 MCG/ACT AERS Inhale 2 puffs into the lungs daily.     . tadalafil (CIALIS) 5 MG tablet Take 5 mg by mouth daily.    Marland Kitchen zolpidem (AMBIEN) 10 MG tablet Take 10 mg by mouth at bedtime.      No current  facility-administered medications on file prior to visit.    Cardiovascular and other pertinent studies:  EKG 04/28/2020: Sinus rhythm 75 bpm  Nonspecific T-abnormality  Coronary intervention 02/2019: LM: Normal LAD: Normal LCx: Prox stent, totally occluded RCA: Patent mid-distal RCA stents           Atherectomy and stent placement prox RCA Synergy DES 2.5X28 mm   Echocardiogram 12/27/2018:  Normal size LV. LVEF 60-65%. No significant valvular abnormality.   Recent labs: 10/21/2019: Glucose 139, BUN/Cr 17/1.05. EGFR >60. Na/K 129/3.8.  H/H 17/48. MCV 91. Platelets 342 Trop HS 3, 4  01/2019: Chol 132, TG 126, HDL 38, LDL 71   Review of Systems  Cardiovascular: Negative for chest pain, dyspnea on exertion, leg swelling, palpitations and syncope.  Musculoskeletal: Positive for back pain.         Vitals:   04/28/20 1335  BP: 130/86  Pulse: 87  Resp: 17  Temp: 98 F (36.7 C)  SpO2: 96%     Body mass index is 28.54 kg/m. Filed Weights   04/28/20 1335  Weight: 182 lb 3.2 oz (82.6 kg)     Objective:   Physical Exam Vitals and nursing note reviewed.  Constitutional:      General: He is not in acute distress. Neck:     Vascular: No JVD.  Cardiovascular:     Rate and Rhythm: Normal rate and regular rhythm.     Pulses: Normal pulses.     Heart sounds: Normal heart sounds. No murmur heard.   Pulmonary:     Effort: Pulmonary effort is normal.     Breath sounds: Normal breath sounds. No wheezing or rales.         Assessment & Recommendations:   60 y.o. Caucasian male with hypertension, hyperlipdiemia, COPD, tobacco dependence, CAD w/multiple prior MI's and stents, tobacco dependence, PSVT, asymptomatic 11 sec pause (noted on monitor).  CAD:  Multiple prior PCI's. Atypical angina symptoms currently. Resume Ranexa 500 mg bid. He has not tolerated Imdur in the past. Okay to continue monotherapy with plavix.  Consider holding it 5 days before any  dental treatment.  Recommend avoiding use of meloxicam.  Continue metoprolol, lipitor. Check labs, including lipid panel  H/o sinus pause: No symptoms. No indication for pacemaker at this time.  H/o PSVT: No symptoms. Continue metoprolol succinate.  Nicotine dependence: Tobacco cessation counseling:  - Currently smoking 1 packs/day   - Patient was informed of the dangers of tobacco abuse including stroke, cancer, and MI, as well as benefits of tobacco cessation. - Patient is willing to quit at this time. - Approximately 5 mins were spent counseling patient cessation techniques. We discussed various methods  to help quit smoking, including deciding on a date to quit, joining a support group, pharmacological agents. Patient would like to try Chantix.Marland Kitchen - I will reassess his progress at the next follow-up visit  Leg pain: Likely neurogenic claudication, radiculopathy  F/u in 2 months  Thank you for referring the patient to [place]. Please feel free to contact with any questions.   Nigel Mormon, MD Pager: [phone] Office: [phone]

## 2020-06-28 ENCOUNTER — Ambulatory Visit: Payer: Medicare HMO | Admitting: Cardiology

## 2020-07-01 ENCOUNTER — Ambulatory Visit: Payer: Medicare HMO | Admitting: Cardiology

## 2020-07-26 ENCOUNTER — Ambulatory Visit: Payer: Medicare HMO | Admitting: Cardiology

## 2020-09-22 ENCOUNTER — Telehealth: Payer: Self-pay

## 2020-09-22 NOTE — Progress Notes (Deleted)
Patient referred by Troy French., FNP for coronary artery disease  Subjective:   Troy French, male    DOB: February 03, 1960, 60 y.o.   MRN: [id number]   No chief complaint on file.   HPI  61 y.o. Caucasian male with hypertension, hyperlipdiemia, COPD, tobacco dependence, CAD w/multiple prior MI's and stents, tobacco dependence, PSVT, asymptomatic 11 sec pause (noted on monitor).  Patient was last seen in our office 04/28/2020 by Dr. Rosemary Holms and recommended for 72-month follow-up.  At last office visit resumed Ranexa 500 mg twice daily, he was unfortunately lost to follow-up until now.  He presents today for urgent visit with concerns of chest pain.***  ***  Patient has been out of plavix for last 2-3 days. He has also not been taking Ranexa. He has had episdoes of chest pain lasting up to 10 min both at rest and exertion.   Initial consultation HPI 10/2019: Patient moved to [place] in 2020. Prior to that, he lived in [place] and [place]. He has had multiple prior MI's and stents, placed in [place], [place], [place]. He established care with Dr. Allyson Sabal since moving to [place]. He underwent prox CA atherectomy and stenting in 02/2018 for comp;aints of chest pain. He also had PSVT and 11 sec pause, without any syncope. EP felt pacemaker was not indicated.   He has now sought to establish cardiac care with me. He denies chest pain, shortness of breath, palpitations, leg edema, orthopnea, PND, TIA/syncope. His biggest complaint is lower back pain and leg pain on ambulation. He is now seeking pain management for the same.  He does not take Aspirin, as he tells me. He also takes plavix. He denies nay bleeding issues. He also takes meloxicam for back pain.   Unfortunately, he continues to smoke 1 PPD. He has tried nicotine gum, patch, and Wellbutrin before. While the first "did not work", Wellbutrin caused him to have suicidal thoughts. He is not willing to try chantix, due  to his experience with Wellbutrin.   External records reviewed and independently interpreted.     Current Outpatient Medications on File Prior to Visit  Medication Sig Dispense Refill   albuterol (VENTOLIN HFA) 108 (90 Base) MCG/ACT inhaler Inhale 2 puffs into the lungs every 6 (six) hours as needed for wheezing or shortness of breath.     atorvastatin (LIPITOR) 20 MG tablet Take 20 mg by mouth daily.     clopidogrel (PLAVIX) 75 MG tablet Take 1 tablet (75 mg total) by mouth daily. 90 tablet 3   diltiazem (DILACOR XR) 120 MG 24 hr capsule Take 120 mg by mouth daily.     HYDROcodone-acetaminophen (NORCO) 10-325 MG tablet Take 1 tablet by mouth 3 (three) times daily.     lisinopril (ZESTRIL) 10 MG tablet Take 1 tablet (10 mg total) by mouth daily. 90 tablet 3   loperamide (IMODIUM) 2 MG capsule Take 1 capsule (2 mg total) by mouth 4 (four) times daily as needed for diarrhea or loose stools. 8 capsule 0   meloxicam (MOBIC) 15 MG tablet Take 15 mg by mouth daily.     metoprolol succinate (TOPROL-XL) 50 MG 24 hr tablet Take 50 mg by mouth daily. Take with or immediately following a meal.     morphine (MS CONTIN) 30 MG 12 hr tablet Take 30 mg by mouth every 12 (twelve) hours.     ondansetron (ZOFRAN-ODT) 4 MG disintegrating tablet Take 1 tablet (4 mg total) by mouth every  8 (eight) hours as needed for nausea or vomiting. 8 tablet 0   pantoprazole (PROTONIX) 40 MG tablet Take 1 tablet (40 mg total) by mouth daily. 30 tablet 1   ranolazine (RANEXA) 500 MG 12 hr tablet Take 1 tablet (500 mg total) by mouth 2 (two) times daily. 120 tablet 2   sodium chloride (OCEAN) 0.65 % SOLN nasal spray Place 1 spray into both nostrils as needed for congestion (dryness).     SPIRIVA RESPIMAT 2.5 MCG/ACT AERS Inhale 2 puffs into the lungs daily.      tadalafil (CIALIS) 5 MG tablet Take 5 mg by mouth daily.     varenicline (CHANTIX STARTING MONTH PAK) 0.5 MG X 11 & 1 MG X 42 tablet Take one 0.5 mg tablet by mouth once  daily for 3 days, then increase to one 0.5 mg tablet twice daily for 4 days, then increase to one 1 mg tablet twice daily. 53 tablet 0   zolpidem (AMBIEN) 10 MG tablet Take 10 mg by mouth at bedtime.      No current facility-administered medications on file prior to visit.    Cardiovascular and other pertinent studies: ***  EKG 04/28/2020: Sinus rhythm 75 bpm  Nonspecific T-abnormality  Coronary intervention 02/2019: LM: Normal LAD: Normal LCx: Prox stent, totally occluded RCA: Patent mid-distal RCA stents           Atherectomy and stent placement prox RCA Synergy DES 2.5X28 mm   Echocardiogram 12/27/2018:  Normal size LV. LVEF 60-65%. No significant valvular abnormality.   Recent labs: CMP Latest Ref Rng & Units 10/21/2019 02/11/2019 02/07/2019  Glucose 70 - 99 mg/dL 139(H) 96 88  BUN 6 - 20 mg/dL $Remove'17 11 10  'ZBVikrX$ Creatinine 0.61 - 1.24 mg/dL 1.05 0.90 0.87  Sodium 135 - 145 mmol/L 129(L) 138 139  Potassium 3.5 - 5.1 mmol/L 3.8 4.4 5.2  Chloride 98 - 111 mmol/L 98 106 105  CO2 22 - 32 mmol/L 16(L) 22 20  Calcium 8.9 - 10.3 mg/dL 9.5 8.4(L) 8.9  Total Protein 6.0 - 8.5 g/dL - - 6.0  Total Bilirubin 0.0 - 1.2 mg/dL - - 0.3  Alkaline Phos 39 - 117 IU/L - - 75  AST 0 - 40 IU/L - - 9  ALT 0 - 44 IU/L - - 9   CBC Latest Ref Rng & Units 10/21/2019 02/11/2019 02/07/2019  WBC 4.0 - 10.5 K/uL 17.0(H) 8.1 7.7  Hemoglobin 13.0 - 17.0 g/dL 17.1(H) 12.6(L) 13.9  Hematocrit 39.0 - 52.0 % 48.9 37.4(L) 42.2  Platelets 150 - 400 K/uL 342 246 272   Lipid Panel     Component Value Date/Time   CHOL 132 02/07/2019 0935   TRIG 126 02/07/2019 0935   HDL 38 (L) 02/07/2019 0935   CHOLHDL 3.5 02/07/2019 0935   LDLCALC 71 02/07/2019 0935   HEMOGLOBIN A1C No results found for: HGBA1C, MPG TSH No results for input(s): TSH in the last 8760 hours.  10/21/2019: Glucose 139, BUN/Cr 17/1.05. EGFR >60. Na/K 129/3.8.  H/H 17/48. MCV 91. Platelets 342 Trop HS 3, 4  01/2019: Chol 132, TG 126, HDL 38,  LDL 71   Review of Systems  Cardiovascular:  Negative for chest pain, dyspnea on exertion, leg swelling, palpitations and syncope.  Musculoskeletal:  Positive for back pain.        There were no vitals filed for this visit.    There is no height or weight on file to calculate BMI. There were no vitals filed for  this visit.    Objective:   Physical Exam Vitals and nursing note reviewed.  Constitutional:      General: He is not in acute distress. Neck:     Vascular: No JVD.  Cardiovascular:     Rate and Rhythm: Normal rate and regular rhythm.     Pulses: Normal pulses.     Heart sounds: Normal heart sounds. No murmur heard. Pulmonary:     Effort: Pulmonary effort is normal.     Breath sounds: Normal breath sounds. No wheezing or rales.        Assessment & Recommendations:   60 y.o. Caucasian male with hypertension, hyperlipdiemia, COPD, tobacco dependence, CAD w/multiple prior MI's and stents, tobacco dependence, PSVT, asymptomatic 11 sec pause (noted on monitor). *** CAD:  Multiple prior PCI's. Atypical angina symptoms currently. Resume Ranexa 500 mg bid. He has not tolerated Imdur in the past. Okay to continue monotherapy with plavix.  Consider holding it 5 days before any dental treatment.  Recommend avoiding use of meloxicam.  Continue metoprolol, lipitor. Check labs, including lipid panel  H/o sinus pause: No symptoms. No indication for pacemaker at this time.  H/o PSVT: No symptoms. Continue metoprolol succinate.  Nicotine dependence: Tobacco cessation counseling:  - Currently smoking 1 packs/day   - Patient was informed of the dangers of tobacco abuse including stroke, cancer, and MI, as well as benefits of tobacco cessation. - Patient is willing to quit at this time. - Approximately 5 mins were spent counseling patient cessation techniques. We discussed various methods to help quit smoking, including deciding on a date to quit, joining a support  group, pharmacological agents. Patient would like to try Chantix.Marland Kitchen - I will reassess his progress at the next follow-up visit  Leg pain: Likely neurogenic claudication, radiculopathy  F/u in 2 months  Thank you for referring the patient to [place]. Please feel free to contact with any questions.   Nigel Mormon, MD Pager: [phone] Office: [phone]

## 2020-09-23 ENCOUNTER — Ambulatory Visit: Payer: Medicare HMO | Admitting: Student

## 2020-09-23 DIAGNOSIS — F17210 Nicotine dependence, cigarettes, uncomplicated: Secondary | ICD-10-CM

## 2020-09-23 DIAGNOSIS — I25119 Atherosclerotic heart disease of native coronary artery with unspecified angina pectoris: Secondary | ICD-10-CM

## 2020-09-23 DIAGNOSIS — I10 Essential (primary) hypertension: Secondary | ICD-10-CM

## 2020-11-01 ENCOUNTER — Other Ambulatory Visit: Payer: Self-pay | Admitting: Student

## 2020-11-01 ENCOUNTER — Encounter: Payer: Self-pay | Admitting: Student

## 2020-11-01 ENCOUNTER — Ambulatory Visit: Payer: Medicare HMO | Admitting: Student

## 2020-11-01 ENCOUNTER — Other Ambulatory Visit: Payer: Self-pay

## 2020-11-01 VITALS — BP 135/87 | HR 81 | Temp 98.6°F | Ht 67.0 in | Wt 180.0 lb

## 2020-11-01 DIAGNOSIS — I25119 Atherosclerotic heart disease of native coronary artery with unspecified angina pectoris: Secondary | ICD-10-CM

## 2020-11-01 DIAGNOSIS — R0789 Other chest pain: Secondary | ICD-10-CM

## 2020-11-01 DIAGNOSIS — R0602 Shortness of breath: Secondary | ICD-10-CM

## 2020-11-01 MED ORDER — RANOLAZINE ER 1000 MG PO TB12: 1000.0000 mg | ORAL_TABLET | Freq: Two times a day (BID) | ORAL | 3 refills | Status: DC

## 2020-11-01 NOTE — Progress Notes (Signed)
Patient referred by Sonia Side., FNP for coronary artery disease  Subjective:   Troy French, male    DOB: [birth year], 60 y.o.   MRN: [id number]   Chief Complaint  Patient presents with   Chest Pain   Follow-up   Fatigue    HPI  60 y.o. Caucasian male with hypertension, hyperlipdiemia, COPD, tobacco dependence, CAD w/multiple prior MI's and stents, tobacco dependence, PSVT, asymptomatic 11 sec pause (noted on monitor).  Patient presents for urgent visit with complaints of chest pain and worsening shortness of breath.  Patient states shortness of breath has slowly progressed over the last few months.  He does have underlying COPD and continues to smoke a pack per day.  He also reports continued atypical chest pain symptoms which are stable both at rest and with exertion.  He has not been taking Ranexa as he feels it does not improve his discomfort.   Initial consultation HPI 10/2019: Patient moved to [place] in 2020. Prior to that, he lived in [place] and [place]. He has had multiple prior MI's and stents, placed in [place], [place], [place]. He established care with Dr. Gwenlyn Found since moving to [place]. He underwent prox CA atherectomy and stenting in 02/2018 for comp;aints of chest pain. He also had PSVT and 11 sec pause, without any syncope. EP felt pacemaker was not indicated.   He has now sought to establish cardiac care with me. He denies chest pain, shortness of breath, palpitations, leg edema, orthopnea, PND, TIA/syncope. His biggest complaint is lower back pain and leg pain on ambulation. He is now seeking pain management for the same.  He does not take Aspirin, as he tells me. He also takes plavix. He denies nay bleeding issues. He also takes meloxicam for back pain.   Unfortunately, he continues to smoke 1 PPD. He has tried nicotine gum, patch, and Wellbutrin before. While the first "did not work", Wellbutrin caused him to have suicidal thoughts. He is  not willing to try chantix, due to his experience with Wellbutrin.   External records reviewed and independently interpreted.   Current Outpatient Medications on File Prior to Visit  Medication Sig Dispense Refill   albuterol (VENTOLIN HFA) 108 (90 Base) MCG/ACT inhaler Inhale 2 puffs into the lungs every 6 (six) hours as needed for wheezing or shortness of breath.     atorvastatin (LIPITOR) 20 MG tablet Take 20 mg by mouth daily.     clopidogrel (PLAVIX) 75 MG tablet Take 1 tablet (75 mg total) by mouth daily. 90 tablet 3   diazepam (VALIUM) 2 MG tablet Take 2 mg by mouth every 6 (six) hours as needed for anxiety.     diltiazem (DILACOR XR) 120 MG 24 hr capsule Take 120 mg by mouth daily.     HYDROcodone-acetaminophen (NORCO) 10-325 MG tablet Take 1 tablet by mouth 3 (three) times daily.     lisinopril (ZESTRIL) 10 MG tablet Take 1 tablet (10 mg total) by mouth daily. 90 tablet 3   loperamide (IMODIUM) 2 MG capsule Take 1 capsule (2 mg total) by mouth 4 (four) times daily as needed for diarrhea or loose stools. 8 capsule 0   meloxicam (MOBIC) 15 MG tablet Take 15 mg by mouth daily.     metoprolol succinate (TOPROL-XL) 50 MG 24 hr tablet Take 50 mg by mouth daily. Take with or immediately following a meal.     morphine (MS CONTIN) 30 MG 12 hr tablet Take 30 mg  by mouth every 12 (twelve) hours.     pantoprazole (PROTONIX) 40 MG tablet Take 1 tablet (40 mg total) by mouth daily. 30 tablet 1   sodium chloride (OCEAN) 0.65 % SOLN nasal spray Place 1 spray into both nostrils as needed for congestion (dryness).     SPIRIVA RESPIMAT 2.5 MCG/ACT AERS Inhale 2 puffs into the lungs daily.      tadalafil (CIALIS) 5 MG tablet Take 5 mg by mouth daily.     zolpidem (AMBIEN) 10 MG tablet Take 10 mg by mouth at bedtime.      varenicline (CHANTIX STARTING MONTH PAK) 0.5 MG X 11 & 1 MG X 42 tablet Take one 0.5 mg tablet by mouth once daily for 3 days, then increase to one 0.5 mg tablet twice daily for 4 days,  then increase to one 1 mg tablet twice daily. (Patient not taking: Reported on 11/01/2020) 53 tablet 0   No current facility-administered medications on file prior to visit.    Cardiovascular and other pertinent studies: EKG 11/01/2020:  Sinus rhythm at a rate of 79 bpm.  Normal axis.  No evidence of ischemia or underlying injury pattern.  Nonspecific T wave abnormality  EKG 04/28/2020: Sinus rhythm 75 bpm  Nonspecific T-abnormality  Coronary intervention 02/2019: LM: Normal LAD: Normal LCx: Prox stent, totally occluded RCA: Patent mid-distal RCA stents           Atherectomy and stent placement prox RCA Synergy DES 2.5X28 mm  Echocardiogram 12/27/2018:  Normal size LV. LVEF 60-65%. No significant valvular abnormality.   Recent labs: CMP Latest Ref Rng & Units 10/21/2019 02/11/2019 02/07/2019  Glucose 70 - 99 mg/dL 139(H) 96 88  BUN 6 - 20 mg/dL _0 Creatinine 0.61 - 1.24 mg/dL 1.05 0.90 0.87  Sodium 135 - 145 mmol/L 129(L) 138 139  Potassium 3.5 - 5.1 mmol/L 3.8 4.4 5.2  Chloride 98 - 111 mmol/L 98 106 105  CO2 22 - 32 mmol/L 16(L) 22 20  Calcium 8.9 - 10.3 mg/dL 9.5 8.4(L) 8.9  Total Protein 6.0 - 8.5 g/dL - - 6.0  Total Bilirubin 0.0 - 1.2 mg/dL - - 0.3  Alkaline Phos 39 - 117 IU/L - - 75  AST 0 - 40 IU/L - - 9  ALT 0 - 44 IU/L - - 9   CBC Latest Ref Rng & Units 10/21/2019 02/11/2019 02/07/2019  WBC 4.0 - 10.5 K/uL 17.0(H) 8.1 7.7  Hemoglobin 13.0 - 17.0 g/dL 17.1(H) 12.6(L) 13.9  Hematocrit 39.0 - 52.0 % 48.9 37.4(L) 42.2  Platelets 150 - 400 K/uL 342 246 272   Lipid Panel     Component Value Date/Time   CHOL 132 02/07/2019 0935   TRIG 126 02/07/2019 0935   HDL 38 (L) 02/07/2019 0935   CHOLHDL 3.5 02/07/2019 0935   LDLCALC 71 02/07/2019 0935   HEMOGLOBIN A1C No results found for: HGBA1C, MPG TSH No results for input(s): TSH in the last 8760 hours.  10/21/2019: Glucose 139, BUN/Cr 17/1.05. EGFR >60. Na/K 129/3.8.  H/H 17/48. MCV 91. Platelets 342 Trop  HS 3, 4  01/2019: Chol 132, TG 126, HDL 38, LDL 71   Review of Systems  Constitutional: Negative for malaise/fatigue and weight gain.  Cardiovascular:  Positive for chest pain (atypical, stable) and dyspnea on exertion. Negative for claudication, leg swelling, near-syncope, orthopnea, palpitations, paroxysmal nocturnal dyspnea and syncope.  Musculoskeletal:  Positive for back pain.  Neurological:  Negative for dizziness.  Vitals:   11/01/20 1341  BP: 135/87  Pulse: 81  Temp: 98.6 F (37 C)  SpO2: 100%     Body mass index is 28.19 kg/m. Filed Weights   11/01/20 1341  Weight: 180 lb (81.6 kg)     Objective:   Physical Exam Vitals and nursing note reviewed.  Constitutional:      General: He is not in acute distress. Neck:     Vascular: No JVD.  Cardiovascular:     Rate and Rhythm: Normal rate and regular rhythm.     Pulses: Normal pulses and intact distal pulses.     Heart sounds: Normal heart sounds, S1 normal and S2 normal. No murmur heard.   No gallop.  Pulmonary:     Effort: Pulmonary effort is normal. No respiratory distress.     Breath sounds: Normal breath sounds. No wheezing, rhonchi or rales.  Musculoskeletal:     Right lower leg: No edema.     Left lower leg: No edema.  Neurological:     Mental Status: He is alert.      Assessment & Recommendations:   60 y.o. Caucasian male with hypertension, hyperlipdiemia, COPD, tobacco dependence, CAD w/multiple prior MI's and stents, tobacco dependence, PSVT, asymptomatic 11 sec pause (noted on monitor).  CAD:  Multiple prior PCI's.  Patient continues to have atypical anginal symptoms. Will resume Ranexa and increase from 500 mg p.o. twice daily to 1000 mg p.o. twice daily. Patient has been unable to tolerate Imdur or nitroglycerin patch/paste in the past due to severe headaches. Continue beta-blocker and statin therapy Continue Plavix No recent lipid profile testing for review, will obtain lipid panel  at this time  Dyspnea on exertion: Given worsening dyspnea on exertion will obtain echocardiogram as well as BMP and BNP Dyspnea may be related to underlying CAD, including concern for progression of CAD.  Discussed with patient regarding repeat ischemic evaluation including stress test versus cardiac catheterization.  Shared decision was to hold off on further ischemic evaluation and first proceed with echocardiogram.  H/o sinus pause: No symptoms. No indication for pacemaker at this time.  H/o PSVT: No symptoms. Continue metoprolol succinate.  Nicotine dependence: Tobacco cessation counseling: - Currently smoking 1 packs/day   -Spent 5 minutes of today's office visit counseling patient regarding tobacco cessation.  Follow-up in 4 weeks, sooner if needed, for cardiac test results and CAD.   Alethia Berthold, PA-C 11/02/2020, 3:38 PM Office: [phone]

## 2020-11-02 ENCOUNTER — Other Ambulatory Visit: Payer: Self-pay | Admitting: Student

## 2020-11-02 DIAGNOSIS — I25119 Atherosclerotic heart disease of native coronary artery with unspecified angina pectoris: Secondary | ICD-10-CM

## 2020-11-02 NOTE — Telephone Encounter (Signed)
Pharmacy requesting diff med. Not covered by Pts insurance.

## 2020-11-03 NOTE — Telephone Encounter (Signed)
PA sent

## 2020-11-05 ENCOUNTER — Other Ambulatory Visit: Payer: Self-pay

## 2020-11-09 ENCOUNTER — Other Ambulatory Visit: Payer: Medicare HMO

## 2020-11-24 LAB — BASIC METABOLIC PANEL
BUN/Creatinine Ratio: 8 — ABNORMAL LOW (ref 10–24)
BUN: 7 mg/dL — ABNORMAL LOW (ref 8–27)
CO2: 17 mmol/L — ABNORMAL LOW (ref 20–29)
Calcium: 9 mg/dL (ref 8.6–10.2)
Chloride: 105 mmol/L (ref 96–106)
Creatinine, Ser: 0.85 mg/dL (ref 0.76–1.27)
Glucose: 110 mg/dL — ABNORMAL HIGH (ref 70–99)
Potassium: 4.6 mmol/L (ref 3.5–5.2)
Sodium: 138 mmol/L (ref 134–144)
eGFR: 99 mL/min/{1.73_m2} (ref >59)

## 2020-11-24 LAB — LIPID PANEL WITH LDL/HDL RATIO
Cholesterol, Total: 272 mg/dL — ABNORMAL HIGH (ref 100–199)
HDL: 36 mg/dL — ABNORMAL LOW (ref >39)
LDL Chol Calc (NIH): 154 mg/dL — ABNORMAL HIGH (ref 0–99)
LDL/HDL Ratio: 4.3 ratio — ABNORMAL HIGH (ref 0.0–3.6)
Triglycerides: 430 mg/dL — ABNORMAL HIGH (ref 0–149)
VLDL Cholesterol Cal: 82 mg/dL — ABNORMAL HIGH (ref 5–40)

## 2020-11-24 LAB — BRAIN NATRIURETIC PEPTIDE: BNP: 44.6 pg/mL (ref 0.0–100.0)

## 2020-11-28 NOTE — Progress Notes (Signed)
Patient referred by [name]., FNP for coronary artery disease  Subjective:   Troy French, male    DOB: 1960/08/27, 60 y.o.   MRN: [id number]   Chief Complaint  Patient presents with   Coronary Artery Disease   Follow-up    4 week   Results    HPI  60 y.o. Caucasian male with hypertension, hyperlipdiemia, COPD, tobacco dependence, CAD w/multiple prior MI's and stents, tobacco dependence, PSVT, asymptomatic 11 sec pause (noted on monitor).  Patient is a daily symptoms of chest pain, both with rest and exertion.  Symptoms are worse on exertion, improves somewhat with rest.  He is not taking Ranexa at this time.  Also not taking Lipitor as he is concerned about his liver.  Reviewed recent lab results with the patient.  On further discussion, patient wants to avoid stress test, and proceed with heart catheterization if necessary.  Initial consultation HPI 10/2019: Patient moved to [place]  in 2020. Prior to that, he lived in [place] and [place]. He has had multiple prior MI's and stents, placed in [place], [place], [place]. He established care with Dr. Allyson Sabal since moving to [place]. He underwent prox CA atherectomy and stenting in 02/2018 for comp;aints of chest pain. He also had PSVT and 11 sec pause, without any syncope. EP felt pacemaker was not indicated.   He has now sought to establish cardiac care with me. He denies chest pain, shortness of breath, palpitations, leg edema, orthopnea, PND, TIA/syncope. His biggest complaint is lower back pain and leg pain on ambulation. He is now seeking pain management for the same.  He does not take Aspirin, as he tells me. He also takes plavix. He denies nay bleeding issues. He also takes meloxicam for back pain.   Unfortunately, he continues to smoke 1 PPD. He has tried nicotine gum, patch, and Wellbutrin before. While the first "did not work", Wellbutrin caused him to have suicidal thoughts. He is not willing to try  chantix, due to his experience with Wellbutrin.   External records reviewed and independently interpreted.     Current Outpatient Medications on File Prior to Visit  Medication Sig Dispense Refill   albuterol (VENTOLIN HFA) 108 (90 Base) MCG/ACT inhaler Inhale 2 puffs into the lungs every 6 (six) hours as needed for wheezing or shortness of breath.     atorvastatin (LIPITOR) 20 MG tablet Take 20 mg by mouth daily.     clopidogrel (PLAVIX) 75 MG tablet Take 1 tablet (75 mg total) by mouth daily. 90 tablet 3   diazepam (VALIUM) 2 MG tablet Take 2 mg by mouth every 6 (six) hours as needed for anxiety.     diltiazem (DILACOR XR) 120 MG 24 hr capsule Take 120 mg by mouth daily.     lisinopril (ZESTRIL) 10 MG tablet Take 1 tablet (10 mg total) by mouth daily. 90 tablet 3   loperamide (IMODIUM) 2 MG capsule Take 1 capsule (2 mg total) by mouth 4 (four) times daily as needed for diarrhea or loose stools. 8 capsule 0   meloxicam (MOBIC) 15 MG tablet Take 15 mg by mouth daily.     metoprolol succinate (TOPROL-XL) 50 MG 24 hr tablet Take 50 mg by mouth daily. Take with or immediately following a meal.     morphine (MS CONTIN) 30 MG 12 hr tablet Take 30 mg by mouth every 12 (twelve) hours.     oxyCODONE-acetaminophen (PERCOCET) 10-325 MG tablet 1 tablet as needed  pantoprazole (PROTONIX) 40 MG tablet Take 1 tablet (40 mg total) by mouth daily. 30 tablet 1   ranolazine (RANEXA) 1000 MG SR tablet TAKE 1 TABLET BY MOUTH TWICE A DAY 60 tablet 3   SPIRIVA RESPIMAT 2.5 MCG/ACT AERS Inhale 2 puffs into the lungs daily.      zolpidem (AMBIEN) 10 MG tablet Take 10 mg by mouth at bedtime.      No current facility-administered medications on file prior to visit.    Cardiovascular and other pertinent studies:  EKG 11/01/2020: Sinus rhythm  Normal EKG  Coronary intervention 02/2019: LM: Normal LAD: Normal LCx: Prox stent, totally occluded RCA: Patent mid-distal RCA stents           Atherectomy and  stent placement prox RCA Synergy DES 2.5X28 mm   Echocardiogram 12/27/2018:  Normal size LV. LVEF 60-65%. No significant valvular abnormality.   Recent labs: 11/23/2020: Glucose 110, BUN/Cr 7/0.85. EGFR 99. Na/K 138/4.6. Rest of the CMP normal Chol 272, TG 430, HDL 36, LDL 154 CBC N/A HbA1C N/A TSH N/A  10/21/2019: Glucose 139, BUN/Cr 17/1.05. EGFR >60. Na/K 129/3.8.  H/H 17/48. MCV 91. Platelets 342 Trop HS 3, 4  01/2019: Chol 132, TG 126, HDL 38, LDL 71   Review of Systems  Cardiovascular:  Positive for chest pain. Negative for dyspnea on exertion, leg swelling, palpitations and syncope.  Musculoskeletal:  Positive for back pain.        Vitals:   11/29/20 0933  BP: (!) 146/80  Pulse: 92  Resp: 16  Temp: 98 F (36.7 C)  SpO2: 98%     Body mass index is 29.13 kg/m. Filed Weights   11/29/20 0933  Weight: 186 lb (84.4 kg)     Objective:   Physical Exam Vitals and nursing note reviewed.  Constitutional:      General: He is not in acute distress. Neck:     Vascular: No JVD.  Cardiovascular:     Rate and Rhythm: Normal rate and regular rhythm.     Pulses: Normal pulses.     Heart sounds: Normal heart sounds. No murmur heard. Pulmonary:     Effort: Pulmonary effort is normal.     Breath sounds: Normal breath sounds. No wheezing or rales.  Musculoskeletal:     Right lower leg: No edema.     Left lower leg: No edema.        Assessment & Recommendations:   60 y.o. Caucasian male with hypertension, hyperlipdiemia, COPD, tobacco dependence, CAD w/multiple prior MI's and stents, tobacco dependence, PSVT, asymptomatic 11 sec pause (noted on monitor).  CAD:  Personally reviewed and independently interpreted prior angiograms. Multiple prior PCI's.  Proximal left circumflex ISR CTO, patent LAD, PCI to proximal and mid RCA (last cath 02/2019) Has typical anginal symptoms.  He also has symptoms of chest pain at rest.  Not convinced that this is unstable  angina.  However, exertional symptoms are improved with rest. Continue metoprolol succinate 50 mg daily. Increase diltiazem to 240 mg daily. I also encouraged him to try taking Ranexa 5 mg twice daily. Given his complex history of CAD, and ongoing anginal symptoms, he warrants ischemic work-up.  Patient would prefer invasive work-up and preference to stress test, which he feels will only delay his further work-up. I explained to the patient Cath Lab unavailability for next 2 weeks, and my unavailability for 2 weeks after that.   Mutually decided to proceed with coronary angiography and possible intervention on 12/28/2020. In the  meantime, patient will make changes to his antianginal regimen, as above.   Continue Plavix 75 mg daily.  Discontinue meloxicam, which currently takes for back pain.  He is willing to take Tylenol instead. Strongly recommend resuming Lipitor at 40 mg daily. Will repeat labs in 3 weeks.  If LDL TG not improved, will consider PCSK9 inhibitor +/- Vascepa. Check echocardiogram  H/o sinus pause: Remote history.  No symptoms. No indication for pacemaker at this time.  H/o PSVT: No symptoms. Continue metoprolol succinate.  Nicotine dependence: Strongly recommend tobacco cessation.   Time spent: 45 min   Nigel Mormon, MD Pager: [phone] Office: [phone]

## 2020-11-29 ENCOUNTER — Other Ambulatory Visit: Payer: Self-pay

## 2020-11-29 ENCOUNTER — Ambulatory Visit: Payer: Medicare HMO | Admitting: Cardiology

## 2020-11-29 ENCOUNTER — Encounter: Payer: Self-pay | Admitting: Cardiology

## 2020-11-29 VITALS — BP 146/80 | HR 92 | Temp 98.0°F | Resp 16 | Ht 67.0 in | Wt 186.0 lb

## 2020-11-29 DIAGNOSIS — F17210 Nicotine dependence, cigarettes, uncomplicated: Secondary | ICD-10-CM

## 2020-11-29 DIAGNOSIS — I25119 Atherosclerotic heart disease of native coronary artery with unspecified angina pectoris: Secondary | ICD-10-CM

## 2020-11-29 DIAGNOSIS — I10 Essential (primary) hypertension: Secondary | ICD-10-CM

## 2020-11-29 MED ORDER — RANOLAZINE ER 500 MG PO TB12: 500.0000 mg | ORAL_TABLET | Freq: Two times a day (BID) | ORAL | 2 refills | Status: DC

## 2020-11-29 MED ORDER — DILTIAZEM HCL ER 240 MG PO CP24: 240.0000 mg | ORAL_CAPSULE | Freq: Every day | ORAL | 3 refills | Status: DC

## 2020-11-29 MED ORDER — ATORVASTATIN CALCIUM 40 MG PO TABS: 40.0000 mg | ORAL_TABLET | Freq: Every day | ORAL | 3 refills | Status: AC

## 2020-12-08 ENCOUNTER — Other Ambulatory Visit: Payer: Self-pay

## 2020-12-08 ENCOUNTER — Ambulatory Visit: Payer: Medicare HMO

## 2020-12-08 DIAGNOSIS — R0602 Shortness of breath: Secondary | ICD-10-CM

## 2020-12-08 DIAGNOSIS — R0789 Other chest pain: Secondary | ICD-10-CM

## 2020-12-28 ENCOUNTER — Encounter (HOSPITAL_COMMUNITY): Admission: RE | Payer: Self-pay | Source: Home / Self Care

## 2020-12-28 ENCOUNTER — Ambulatory Visit (HOSPITAL_COMMUNITY): Admission: RE | Admit: 2020-12-28 | Payer: Medicare HMO | Source: Home / Self Care | Admitting: Cardiology

## 2020-12-28 SURGERY — LEFT HEART CATH AND CORONARY ANGIOGRAPHY
Anesthesia: LOCAL

## 2021-01-14 ENCOUNTER — Ambulatory Visit: Payer: Medicare HMO | Admitting: Cardiology

## 2021-03-31 ENCOUNTER — Ambulatory Visit: Payer: Medicare HMO | Admitting: Podiatry

## 2021-04-01 ENCOUNTER — Other Ambulatory Visit: Payer: Self-pay

## 2021-04-01 ENCOUNTER — Emergency Department (HOSPITAL_COMMUNITY): Payer: Medicare HMO

## 2021-04-01 ENCOUNTER — Encounter (HOSPITAL_COMMUNITY): Payer: Self-pay | Admitting: Emergency Medicine

## 2021-04-01 ENCOUNTER — Observation Stay (HOSPITAL_COMMUNITY)
Admission: EM | Admit: 2021-04-01 | Discharge: 2021-04-02 | Disposition: A | Discharge status: Home / Self Care | Payer: Medicare HMO | Attending: Internal Medicine | Admitting: Internal Medicine

## 2021-04-01 DIAGNOSIS — Z955 Presence of coronary angioplasty implant and graft: Secondary | ICD-10-CM | POA: Diagnosis not present

## 2021-04-01 DIAGNOSIS — Z79899 Other long term (current) drug therapy: Secondary | ICD-10-CM | POA: Insufficient documentation

## 2021-04-01 DIAGNOSIS — I2510 Atherosclerotic heart disease of native coronary artery without angina pectoris: Secondary | ICD-10-CM | POA: Insufficient documentation

## 2021-04-01 DIAGNOSIS — I509 Heart failure, unspecified: Secondary | ICD-10-CM | POA: Diagnosis not present

## 2021-04-01 DIAGNOSIS — R112 Nausea with vomiting, unspecified: Secondary | ICD-10-CM | POA: Diagnosis present

## 2021-04-01 DIAGNOSIS — K529 Noninfective gastroenteritis and colitis, unspecified: Principal | ICD-10-CM | POA: Diagnosis present

## 2021-04-01 DIAGNOSIS — I110 Hypertensive heart disease with heart failure: Secondary | ICD-10-CM | POA: Diagnosis not present

## 2021-04-01 DIAGNOSIS — J449 Chronic obstructive pulmonary disease, unspecified: Secondary | ICD-10-CM | POA: Diagnosis not present

## 2021-04-01 DIAGNOSIS — Z7902 Long term (current) use of antithrombotics/antiplatelets: Secondary | ICD-10-CM | POA: Insufficient documentation

## 2021-04-01 DIAGNOSIS — M549 Dorsalgia, unspecified: Secondary | ICD-10-CM | POA: Insufficient documentation

## 2021-04-01 DIAGNOSIS — R109 Unspecified abdominal pain: Secondary | ICD-10-CM

## 2021-04-01 LAB — LACTIC ACID, PLASMA
Lactic Acid, Venous: 1.4 mmol/L (ref 0.5–1.9)
Lactic Acid, Venous: 2 mmol/L (ref 0.5–1.9)

## 2021-04-01 LAB — BASIC METABOLIC PANEL
Anion gap: 15 (ref 5–15)
BUN: 11 mg/dL (ref 8–23)
CO2: 19 mmol/L — ABNORMAL LOW (ref 22–32)
Calcium: 10.2 mg/dL (ref 8.9–10.3)
Chloride: 103 mmol/L (ref 98–111)
Creatinine, Ser: 1.19 mg/dL (ref 0.61–1.24)
GFR, Estimated: >60 mL/min (ref >60)
Glucose, Bld: 142 mg/dL — ABNORMAL HIGH (ref 70–99)
Potassium: 4.2 mmol/L (ref 3.5–5.1)
Sodium: 137 mmol/L (ref 135–145)

## 2021-04-01 LAB — BRAIN NATRIURETIC PEPTIDE: B Natriuretic Peptide: 23.8 pg/mL (ref 0.0–100.0)

## 2021-04-01 LAB — CBC
HCT: 48.5 % (ref 39.0–52.0)
Hemoglobin: 17 g/dL (ref 13.0–17.0)
MCH: 31.7 pg (ref 26.0–34.0)
MCHC: 35.1 g/dL (ref 30.0–36.0)
MCV: 90.5 fL (ref 80.0–100.0)
Platelets: 368 10*3/uL (ref 150–400)
RBC: 5.36 MIL/uL (ref 4.22–5.81)
RDW: 12.1 % (ref 11.5–15.5)
WBC: 14.4 10*3/uL — ABNORMAL HIGH (ref 4.0–10.5)
nRBC: 0 % (ref 0.0–0.2)

## 2021-04-01 LAB — PROTIME-INR
INR: 0.9 (ref 0.8–1.2)
Prothrombin Time: 12.3 seconds (ref 11.4–15.2)

## 2021-04-01 LAB — HEPATIC FUNCTION PANEL
ALT: 34 U/L (ref 0–44)
AST: 19 U/L (ref 15–41)
Albumin: 3.9 g/dL (ref 3.5–5.0)
Alkaline Phosphatase: 74 U/L (ref 38–126)
Bilirubin, Direct: <0.1 mg/dL (ref 0.0–0.2)
Total Bilirubin: 1.1 mg/dL (ref 0.3–1.2)
Total Protein: 7 g/dL (ref 6.5–8.1)

## 2021-04-01 LAB — TROPONIN I (HIGH SENSITIVITY)
Troponin I (High Sensitivity): 4 ng/L (ref <18)
Troponin I (High Sensitivity): 5 ng/L (ref <18)

## 2021-04-01 LAB — TSH: TSH: 1.043 u[IU]/mL (ref 0.350–4.500)

## 2021-04-01 LAB — LIPASE, BLOOD: Lipase: 24 U/L (ref 11–51)

## 2021-04-01 MED ORDER — LACTATED RINGERS IV SOLN: INTRAVENOUS | Status: DC

## 2021-04-01 MED ORDER — ENOXAPARIN SODIUM 40 MG/0.4ML IJ SOSY
40.0000 mg | PREFILLED_SYRINGE | INTRAMUSCULAR | Status: DC
  Administered 2021-04-01: 40 mg via SUBCUTANEOUS
  Filled 2021-04-01: qty 0.4

## 2021-04-01 MED ORDER — NICOTINE 14 MG/24HR TD PT24
14.0000 mg | MEDICATED_PATCH | Freq: Every day | TRANSDERMAL | Status: DC
  Administered 2021-04-02: 14 mg via TRANSDERMAL
  Filled 2021-04-01: qty 1

## 2021-04-01 MED ORDER — LACTATED RINGERS IV BOLUS
500.0000 mL | Freq: Once | INTRAVENOUS | Status: AC
  Administered 2021-04-01: 500 mL via INTRAVENOUS

## 2021-04-01 MED ORDER — CLOPIDOGREL BISULFATE 75 MG PO TABS
75.0000 mg | ORAL_TABLET | Freq: Every day | ORAL | Status: DC
  Administered 2021-04-02: 75 mg via ORAL
  Filled 2021-04-01: qty 1

## 2021-04-01 MED ORDER — ALBUTEROL SULFATE (2.5 MG/3ML) 0.083% IN NEBU: 3.0000 mL | INHALATION_SOLUTION | Freq: Four times a day (QID) | RESPIRATORY_TRACT | Status: DC | PRN

## 2021-04-01 MED ORDER — DIAZEPAM 2 MG PO TABS
2.0000 mg | ORAL_TABLET | Freq: Four times a day (QID) | ORAL | Status: DC | PRN
  Administered 2021-04-02: 2 mg via ORAL
  Filled 2021-04-01: qty 1

## 2021-04-01 MED ORDER — LIDOCAINE VISCOUS HCL 2 % MT SOLN
15.0000 mL | Freq: Once | OROMUCOSAL | Status: AC
  Administered 2021-04-01: 15 mL via ORAL
  Filled 2021-04-01: qty 15

## 2021-04-01 MED ORDER — ZOLPIDEM TARTRATE 5 MG PO TABS
10.0000 mg | ORAL_TABLET | Freq: Every day | ORAL | Status: DC
  Administered 2021-04-01: 10 mg via ORAL
  Filled 2021-04-01: qty 2

## 2021-04-01 MED ORDER — METOPROLOL SUCCINATE ER 50 MG PO TB24
50.0000 mg | ORAL_TABLET | Freq: Every day | ORAL | Status: DC
  Administered 2021-04-02: 50 mg via ORAL
  Filled 2021-04-01: qty 1

## 2021-04-01 MED ORDER — ONDANSETRON HCL 4 MG/2ML IJ SOLN
4.0000 mg | Freq: Once | INTRAMUSCULAR | Status: AC
  Administered 2021-04-01: 4 mg via INTRAVENOUS
  Filled 2021-04-01: qty 2

## 2021-04-01 MED ORDER — HYDROMORPHONE HCL 1 MG/ML IJ SOLN: 0.5000 mg | INTRAMUSCULAR | Status: DC | PRN

## 2021-04-01 MED ORDER — IOHEXOL 350 MG/ML SOLN
100.0000 mL | Freq: Once | INTRAVENOUS | Status: AC | PRN
  Administered 2021-04-01: 100 mL via INTRAVENOUS

## 2021-04-01 MED ORDER — OXYCODONE-ACETAMINOPHEN 5-325 MG PO TABS
1.0000 | ORAL_TABLET | Freq: Three times a day (TID) | ORAL | Status: DC | PRN
  Administered 2021-04-02: 1 via ORAL
  Filled 2021-04-01: qty 1

## 2021-04-01 MED ORDER — OXYCODONE-ACETAMINOPHEN 10-325 MG PO TABS: 1.0000 | ORAL_TABLET | Freq: Three times a day (TID) | ORAL | Status: DC | PRN

## 2021-04-01 MED ORDER — FENTANYL CITRATE PF 50 MCG/ML IJ SOSY
75.0000 ug | PREFILLED_SYRINGE | Freq: Once | INTRAMUSCULAR | Status: AC
  Administered 2021-04-01: 75 ug via INTRAVENOUS
  Filled 2021-04-01: qty 2

## 2021-04-01 MED ORDER — OXYCODONE HCL 5 MG PO TABS
5.0000 mg | ORAL_TABLET | Freq: Three times a day (TID) | ORAL | Status: DC | PRN
  Administered 2021-04-02: 5 mg via ORAL
  Filled 2021-04-01: qty 1

## 2021-04-01 MED ORDER — FAMOTIDINE IN NACL 20-0.9 MG/50ML-% IV SOLN
20.0000 mg | Freq: Once | INTRAVENOUS | Status: AC
  Administered 2021-04-01: 20 mg via INTRAVENOUS
  Filled 2021-04-01: qty 50

## 2021-04-01 MED ORDER — HYDROMORPHONE HCL 1 MG/ML IJ SOLN
0.5000 mg | INTRAMUSCULAR | Status: DC | PRN
  Administered 2021-04-01: 0.5 mg via INTRAVENOUS
  Filled 2021-04-01: qty 0.5

## 2021-04-01 MED ORDER — ALUM & MAG HYDROXIDE-SIMETH 200-200-20 MG/5ML PO SUSP
30.0000 mL | Freq: Once | ORAL | Status: AC
  Administered 2021-04-01: 30 mL via ORAL
  Filled 2021-04-01: qty 30

## 2021-04-01 MED ORDER — MORPHINE SULFATE (PF) 4 MG/ML IV SOLN
4.0000 mg | Freq: Once | INTRAVENOUS | Status: AC
  Administered 2021-04-01: 4 mg via INTRAVENOUS
  Filled 2021-04-01: qty 1

## 2021-04-01 MED ORDER — UMECLIDINIUM BROMIDE 62.5 MCG/ACT IN AEPB
1.0000 | INHALATION_SPRAY | Freq: Every day | RESPIRATORY_TRACT | Status: DC
Start: 2021-04-01 — End: 2021-04-02
  Administered 2021-04-02: 1 via RESPIRATORY_TRACT
  Filled 2021-04-01: qty 7

## 2021-04-01 MED ORDER — PREDNISOLONE ACETATE 1 % OP SUSP
1.0000 [drp] | Freq: Four times a day (QID) | OPHTHALMIC | Status: DC
  Administered 2021-04-02 (×2): 1 [drp] via OPHTHALMIC
  Filled 2021-04-01: qty 5

## 2021-04-01 MED ORDER — DILTIAZEM HCL ER COATED BEADS 240 MG PO CP24
240.0000 mg | ORAL_CAPSULE | Freq: Every day | ORAL | Status: DC
  Administered 2021-04-02: 240 mg via ORAL
  Filled 2021-04-01 (×2): qty 1

## 2021-04-01 MED ORDER — METOCLOPRAMIDE HCL 5 MG/ML IJ SOLN
10.0000 mg | Freq: Once | INTRAMUSCULAR | Status: AC
  Administered 2021-04-01: 10 mg via INTRAVENOUS
  Filled 2021-04-01: qty 2

## 2021-04-01 MED ORDER — ATORVASTATIN CALCIUM 40 MG PO TABS
40.0000 mg | ORAL_TABLET | Freq: Every day | ORAL | Status: DC
  Administered 2021-04-02: 40 mg via ORAL
  Filled 2021-04-01: qty 1

## 2021-04-01 MED ORDER — FENTANYL CITRATE PF 50 MCG/ML IJ SOSY
75.0000 ug | PREFILLED_SYRINGE | Freq: Once | INTRAMUSCULAR | Status: AC
Start: 2021-04-01 — End: 2021-04-01
  Administered 2021-04-01: 75 ug via INTRAVENOUS
  Filled 2021-04-01: qty 2

## 2021-04-01 MED ORDER — ACETAMINOPHEN 325 MG PO TABS
650.0000 mg | ORAL_TABLET | Freq: Four times a day (QID) | ORAL | Status: DC | PRN
  Administered 2021-04-01 – 2021-04-02 (×2): 650 mg via ORAL
  Filled 2021-04-01 (×2): qty 2

## 2021-04-01 MED ORDER — ACETAMINOPHEN 650 MG RE SUPP: 650.0000 mg | RECTAL | Status: DC | PRN

## 2021-04-01 MED ORDER — SODIUM CHLORIDE 0.9 % IV BOLUS
500.0000 mL | Freq: Once | INTRAVENOUS | Status: AC
  Administered 2021-04-01: 500 mL via INTRAVENOUS

## 2021-04-01 MED ORDER — LACTATED RINGERS IV BOLUS
1000.0000 mL | Freq: Once | INTRAVENOUS | Status: AC
  Administered 2021-04-01: 1000 mL via INTRAVENOUS

## 2021-04-01 MED ORDER — FLUTICASONE FUROATE-VILANTEROL 100-25 MCG/ACT IN AEPB
1.0000 | INHALATION_SPRAY | Freq: Every day | RESPIRATORY_TRACT | Status: DC
  Administered 2021-04-02: 1 via RESPIRATORY_TRACT
  Filled 2021-04-01: qty 28

## 2021-04-01 MED ORDER — LISINOPRIL 10 MG PO TABS
10.0000 mg | ORAL_TABLET | Freq: Every day | ORAL | Status: DC
  Administered 2021-04-02: 10 mg via ORAL
  Filled 2021-04-01: qty 1

## 2021-04-01 MED ORDER — MORPHINE SULFATE ER 15 MG PO TBCR
15.0000 mg | EXTENDED_RELEASE_TABLET | Freq: Every day | ORAL | Status: DC
  Administered 2021-04-01 – 2021-04-02 (×2): 15 mg via ORAL
  Filled 2021-04-01 (×2): qty 1

## 2021-04-01 NOTE — Hospital Course (Addendum)
Has a lot on his mind, especially with his dog alone at home. Wants to eat today.  ? ?Would like to go later today if he is able to eat. ? ?_____________________________________________________________________ ? ?Subjective fever, but unsure and did not check. ? ?No chills. ? ?Has had diarrhea, clear and watery. No blood or dark stools. States he went over a dozen times each day. ? ?Had vomiting on Wednesday, then started having diarrhea. ? ?No recent travel, outdoor activities.  ? 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 Cleaning wound with soap and water along with bacitracin ointment. ? ?No change in diet. Has been unable to keep food down since starting to throw up 3 days ago. Unable to even hold his medications down.  ? ? ? ?Chest pain comes on with exertion.  ? ?Feels less nauseous now and pain is better controlled. ? ?Lives alone with dog. ?Son lives nearby.  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 ?Independent in ADLs and iADLs. ? ?50 pack-year smoking history, currently smokes. Occasional alcohol use. Marijuana use. No other recreational drugs. ? ?Dad passed from heart disease. Brother, uncles, grandfather all with heart disease.  ? ?Patient had first heart attack at 29. ? ? ?

## 2021-04-01 NOTE — ED Triage Notes (Signed)
Pt from home BIB EMS for ShOB and left sided chest pain radiating towards the left arm and in back. C/o NVD since Wednesday. Hx of multiple stents placed.  ?

## 2021-04-01 NOTE — ED Notes (Signed)
Pt vomited after taking PO meds, MD made aware.  ?

## 2021-04-01 NOTE — Progress Notes (Deleted)
?Date: 04/01/2021     ?     ?     ?Patient Name:  Troy French MRN: [id number]  ?DOB: 06-Dec-1960 Age / Sex: 61 y.o., male   ?PCP: [name]., FNP    ?     ?     ?Medical Service: Internal Medicine Teaching Service    ?     ?     ?Attending Physician: Dr. Mercie Eon, MD    ?First Contact: Leonie Green, MS 3 Pager: [phone]  ?Second Contact: Dr. Ilene Qua Pager: [phone]  ?Third Contact Dr. Merrilyn Puma Pager: [phone]  ?     ?After Hours (After 5p/  First Contact Pager: [phone]  ?weekends / holidays): Second Contact Pager: [phone]  ? ?Chief Complaint: epigastric pain and nausea/vomiting ? ?History of Present Illness:  ?Troy French  is a 61 yo with past medical history of CAD status post stents, CHF, OSA untreated, COPD, GERD presenting today with epigastric pain and nausea/vomiting. ? ?Patient states he began experiencing worsening nausea/vomiting three days ago. He has been unable to tolerate po and has not been taking medications as a result. He has also been having diarrhea during this time which began two days ago. He endorses subjective fever yesterday, but unsure of actual temperature since he did not check. He has had no chills during this time. Patient states stools have been clear and watery, no blood or dark stools. States he has gone over a dozen times each day. Patient denies changes to diet, no sick contacts, no recent travel, or participated in outdoor activities. 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 He has been cleaning wound with soap and water, and coating with bacitracin ointment. States there has been no swelling or pain to the area, improved since incident. Patient states he feels less nauseous now and pain is better controlled after ED interventions. ? ?Today he is also endorsing some left sided chest pain that is worsened with exertion. He states there is some discomfort with vomiting as well. No pain with inspirations. States pain has been  ongoing and unchanged for 2-3 months now. Patient states he was worried about possibly worsened CAD given previous episodes presenting with nausea and vomiting. Unsure if today's pain is similar to past episodes.  ? ?No SOB, palpitations, constipation, dizziness, dysuria, burning with urination, hematuria, hematemesis, dark stools.  ? ?ED course: CBC: WBC 14.4; BMP, troponin, lipase, hepatic panel, and PT/INR wnl; EKG with no ischemic changes; CT chest/abd/pelvis obtained, concerning for enteritis possible SBO. Patient given 4mg  morphine/75 mcg fentanyl IV x1, NS bolus x1, Zofran, Reglan, Pepcid IV. Attempted a GI cocktail, patient unable to tolerate.  Patient was tachycardic at 1 point, this normalized after IV fluids. ? ? ?Meds:  ?Current Meds  ?Medication Sig  ? albuterol (VENTOLIN HFA) 108 (90 Base) MCG/ACT inhaler Inhale 2 puffs into the lungs every 6 (six) hours as needed for wheezing or shortness of breath.  ? atorvastatin (LIPITOR) 40 MG tablet Take 1 tablet (40 mg total) by mouth daily.  ? clopidogrel (PLAVIX) 75 MG tablet Take 1 tablet (75 mg total) by mouth daily.  ? cyclobenzaprine (FLEXERIL) 10 MG tablet Take 10 mg by mouth 3 (three) times daily.  ? diazepam (VALIUM) 2 MG tablet Take 2 mg by mouth every 6 (six) hours as needed for anxiety.  ? diltiazem (DILACOR XR) 240 MG 24 hr capsule Take 1 capsule (240 mg total)  by mouth daily.  ? famotidine (PEPCID) 20 MG tablet Take 20 mg by mouth 2 (two) times daily.  ? Fluticasone-Umeclidin-Vilant (TRELEGY ELLIPTA) 100-62.5-25 MCG/ACT AEPB Inhale 1 puff into the lungs daily.  ? lisinopril (ZESTRIL) 10 MG tablet Take 1 tablet (10 mg total) by mouth daily.  ? metoprolol succinate (TOPROL-XL) 50 MG 24 hr tablet Take 50 mg by mouth daily. Take with or immediately following a meal.  ? morphine (MS CONTIN) 30 MG 12 hr tablet Take 30 mg by mouth daily.  ? oxyCODONE-acetaminophen (PERCOCET) 10-325 MG tablet Take 1 tablet by mouth 3 (three) times daily as needed.  ?  pantoprazole (PROTONIX) 40 MG tablet Take 1 tablet (40 mg total) by mouth daily.  ? prednisoLONE acetate (PRED FORTE) 1 % ophthalmic suspension Place 1 drop into the left eye in the morning, at noon, in the evening, and at bedtime.  ? ranolazine (RANEXA) 500 MG 12 hr tablet Take 1 tablet (500 mg total) by mouth 2 (two) times daily. (Patient taking differently: Take 500 mg by mouth daily.)  ? zolpidem (AMBIEN) 10 MG tablet Take 10 mg by mouth at bedtime.   ? ? ? ?Allergies: ?Allergies as of 04/01/2021 - Review Complete 04/01/2021  ?Allergen Reaction Noted  ? Nitroglycerin Nausea And Vomiting 08/22/2018  ? Isosorbide dinitrate Nausea And Vomiting 10/28/2019  ? ?Past Medical History:  ?Diagnosis Date  ? Arthritis   ? CHF (congestive heart failure) (HCC)   ? COPD (chronic obstructive pulmonary disease) (HCC)   ? Coronary artery disease   ? Hyperlipidemia   ? Hypertension   ? ? ?Family History:  ?Extensive family history of CAD, Dad passed from heart disease. Brother, uncles, grandfather all with heart disease. Patient had first heart attack at 29. ? ?Social History:  ?Patient lives alone in [place] with his dog. Son and daughter in law live nearby, states they are his main social support. He currently works part-time job with [employer], but used to work as Music therapist, now retired. He is very independent and is able to perform all ADLs and iADLs on his own. He has a 50 pack-year smoking history, currently continues to smoke. He endorses occasional alcohol use. Endorses Marijuana use, 2 weeks straight every couple months. Does not use other recreational drugs. ? ?Review of Systems: ?A complete ROS was negative except as per HPI.  ? ?Physical Exam: ?Blood pressure 130/67, pulse 85, temperature 97.9 ?F (36.6 ?C), temperature source Oral, resp. rate 20, height 5\' 7"  (1.702 m), weight 84.4 kg, SpO2 98 %. ? ?Physical Exam: ?Constitutional: Middle aged man laying bed, conversant, NAD ?HEENT: atraumatic, normocephalic, dry  membranes ?Cardio: Tachycardic, regular rhythm, no murmurs auscultated ?Pulm: CTAB, normal work of breathing on room air, no wheezes ?Abd: soft, non-distended, moderate tenderness to palpation of epigastric region ?Extremities: well-healing bite wounds to left dorsal surface and right mid shin, mild erythema surrounding wounds, non-tender to palpation, no purulent discharge or swelling, decreased skin turgor of lower extremities ?MSK: normal bulk and tone, moving all extremities spontaneously ?Neuro: Alert and oriented x3, answering questions appropriately ?Psych: pleasant affect ? ? ? ? ?EKG: personally reviewed my interpretation is sinus arhythmia. ? ?CXR: personally reviewed my interpretation is trachea at midline, costovertebral angle present bilaterally with no signs of pleural effusion,  some  interstitial markings bilaterally, similar to prior. No evidence of pneumothorax. No bony abnormalities. ? ?Assessment & Plan by Problem: ?Principal Problem: ?  Enteritis ? ?Mr. Ferrone  is a 61 yo with past medical history  of CAD status post stents, CHF, COPD, GERD presenting today with epigastric pain and nausea/vomiting for the past 3 days, he is being admitted today for inability to tolerate po 2/2 enteritis and possible SBO. ? ?Enteritis ?Possible developing SBO ?N/V ?Patient today with epigastric pain and nausea vomiting for the past three days. He has been unable to tolerate po during this time and has not been taking his medications due to this. Differentials include enteritis vs possible partial SBO vs  infection 2/2 to dog bite. Presentation, leukocytosis, and CT imaging today with prominent loops of fluid and gas-filled small bowel in the upper abdomen with associated wall thickening, are suggestive of enteritis. Will obtain GI path panel and C. diff pcr to look for source of infection. A developing partial SBO also possible with presentation, but given concurrent and large volume diarrhea think an obstruction is  less likely at this time. No high pitched bowel sounds or peritonitis on exam. His history of dog bite 6 days ago, could be a source of enteric infection. He is not immunocompromised, remains afebrile,

## 2021-04-01 NOTE — ED Provider Notes (Signed)
?[facility] EMERGENCY DEPARTMENT ?Provider Note ? ? ?CSN: [id number] ?Arrival date & time: 04/01/21  4827 ? ?  ? ?History ? ?Chief Complaint  ?Patient presents with  ? Chest Pain  ? ? ?Troy French is a 61 y.o. male. ? ?HPI ? ?61 year old male with past medical history of CAD status post stents, CHF, COPD, HTN, HLD, GERD presents the emergency department complaining of epigastric abdominal pain, nausea/vomiting.  Patient states the symptoms been going on for the past 3 days.  The discomfort is always been associated with nonbloody emesis.  He has had episodes of diarrhea.  He presents today because he states the symptoms have become more severe and he is now complaining of upper chest/back pain.  Denies any cough, hemoptysis.  No recent fever.  Denies any surgeries in his abdomen.  Denies any history of gastritis/ulcers. ? ?Home Medications ?Prior to Admission medications   ?Medication Sig Start Date End Date Taking? Authorizing Provider  ?albuterol (VENTOLIN HFA) 108 (90 Base) MCG/ACT inhaler Inhale 2 puffs into the lungs every 6 (six) hours as needed for wheezing or shortness of breath.   Yes [provider]  ?atorvastatin (LIPITOR) 40 MG tablet Take 1 tablet (40 mg total) by mouth daily. 11/29/20  Yes Patwardhan, Manish J, MD  ?clopidogrel (PLAVIX) 75 MG tablet Take 1 tablet (75 mg total) by mouth daily. 04/28/20  Yes Patwardhan, Manish J, MD  ?cyclobenzaprine (FLEXERIL) 10 MG tablet Take 10 mg by mouth 3 (three) times daily. 03/11/21  Yes [provider]  ?diazepam (VALIUM) 2 MG tablet Take 2 mg by mouth every 6 (six) hours as needed for anxiety.   Yes [provider]  ?diltiazem (DILACOR XR) 240 MG 24 hr capsule Take 1 capsule (240 mg total) by mouth daily. 11/29/20  Yes Patwardhan, Manish J, MD  ?famotidine (PEPCID) 20 MG tablet Take 20 mg by mouth 2 (two) times daily. 12/30/20  Yes [provider]  ?Fluticasone-Umeclidin-Vilant (TRELEGY ELLIPTA) 100-62.5-25  MCG/ACT AEPB Inhale 1 puff into the lungs daily. 12/06/20  Yes [provider]  ?lisinopril (ZESTRIL) 10 MG tablet Take 1 tablet (10 mg total) by mouth daily. 02/26/19  Yes Runell Gess, MD  ?metoprolol succinate (TOPROL-XL) 50 MG 24 hr tablet Take 50 mg by mouth daily. Take with or immediately following a meal.   Yes [provider]  ?morphine (MS CONTIN) 30 MG 12 hr tablet Take 30 mg by mouth daily.   Yes [provider]  ?oxyCODONE-acetaminophen (PERCOCET) 10-325 MG tablet Take 1 tablet by mouth 3 (three) times daily as needed.   Yes [provider]  ?pantoprazole (PROTONIX) 40 MG tablet Take 1 tablet (40 mg total) by mouth daily. 02/11/19  Yes Arty Baumgartner, NP  ?prednisoLONE acetate (PRED FORTE) 1 % ophthalmic suspension Place 1 drop into the left eye in the morning, at noon, in the evening, and at bedtime. 02/02/21  Yes [provider]  ?ranolazine (RANEXA) 500 MG 12 hr tablet Take 1 tablet (500 mg total) by mouth 2 (two) times daily. ?Patient taking differently: Take 500 mg by mouth daily. 11/29/20  Yes Patwardhan, Manish J, MD  ?zolpidem (AMBIEN) 10 MG tablet Take 10 mg by mouth at bedtime.  01/27/19  Yes [provider]  ?loperamide (IMODIUM) 2 MG capsule Take 1 capsule (2 mg total) by mouth 4 (four) times daily as needed for diarrhea or loose stools. ?Patient not taking: Reported on 04/01/2021 10/21/19   Benjiman Core, MD  ?[facility]  RESPIMAT 2.5 MCG/ACT AERS Inhale 2 puffs into the lungs daily.  ?Patient not taking: Reported on 04/01/2021 01/13/19   [provider]  ?   ? ?Allergies    ?Nitroglycerin and Isosorbide dinitrate   ? ?Review of Systems   ?Review of Systems  ?Constitutional:  Negative for fever.  ?Respiratory:  Negative for shortness of breath.   ?Cardiovascular:  Positive for chest pain.  ?Gastrointestinal:  Positive for diarrhea, nausea and vomiting. Negative for abdominal pain.  ?Musculoskeletal:  Positive for back pain.   ?Skin:  Negative for rash.  ?Neurological:  Negative for headaches.  ? ?Physical Exam ?Updated Vital Signs ?BP 132/71   Pulse 87   Temp 97.6 ?F (36.4 ?C) (Oral)   Resp (!) 27   Ht 5\' 7"  (1.702 m)   Wt 84.4 kg   SpO2 98%   BMI 29.14 kg/m?  ?Physical Exam ?Vitals and nursing note reviewed.  ?Constitutional:   ?   General: He is in acute distress.  ?   Appearance: Normal appearance. He is not diaphoretic.  ?HENT:  ?   Head: Normocephalic.  ?   Mouth/Throat:  ?   Mouth: Mucous membranes are moist.  ?Cardiovascular:  ?   Rate and Rhythm: Normal rate.  ?   Comments: Equal blood pressures in the bilateral upper extremities, equal palpable radial pulses ?Pulmonary:  ?   Effort: Pulmonary effort is normal. No respiratory distress.  ?Abdominal:  ?   Palpations: Abdomen is soft.  ?   Tenderness: There is abdominal tenderness. There is no guarding or rebound.  ?Skin: ?   General: Skin is warm.  ?Neurological:  ?   General: No focal deficit present.  ?   Mental Status: He is alert and oriented to person, place, and time. Mental status is at baseline.  ?Psychiatric:     ?   Mood and Affect: Mood normal.  ? ? ?ED Results / Procedures / Treatments   ?Labs ?(all labs ordered are listed, but only abnormal results are displayed) ?Labs Reviewed  ?BASIC METABOLIC PANEL - Abnormal; Notable for the following components:  ?    Result Value  ? CO2 19 (*)   ? Glucose, Bld 142 (*)   ? All other components within normal limits  ?CBC - Abnormal; Notable for the following components:  ? WBC 14.4 (*)   ? All other components within normal limits  ?PROTIME-INR  ?HEPATIC FUNCTION PANEL  ?LIPASE, BLOOD  ?TROPONIN I (HIGH SENSITIVITY)  ?TROPONIN I (HIGH SENSITIVITY)  ? ? ?EKG ?EKG Interpretation ? ?Date/Time:  Friday April 01 2021 09:07:07 EDT ?Ventricular Rate:  75 ?PR Interval:  147 ?QRS Duration: 91 ?QT Interval:  376 ?QTC Calculation: 420 ?R Axis:   29 ?Text Interpretation: Sinus arrhythmia Confirmed by 06-03-1980 [phone]) on 04/01/2021  9:18:27 AM ? ?Radiology ?DG Chest 2 View ? ?Result Date: 04/01/2021 ?CLINICAL DATA:  Chest pain, shortness of breath EXAM: CHEST - 2 VIEW COMPARISON:  10/21/2019 FINDINGS: The heart size and mediastinal contours are within normal limits. Prior coronary artery stenting. Mildly coarsened interstitial markings bilaterally, similar to prior. No focal airspace consolidation, pleural effusion, or pneumothorax. The visualized skeletal structures are unremarkable. IMPRESSION: Chronic bronchitic type lung changes. No focal airspace consolidation. Electronically Signed   By: 12/21/2019 D.O.   On: 04/01/2021 09:56   ? ?Procedures ?Ultrasound ED Peripheral IV (Provider) ? ?Date/Time: 04/01/2021 1:16 PM ?Performed by: 04/03/2021, DO ?Authorized by: Rozelle Logan, DO  ? ?Procedure details:  ?  Indications: multiple failed IV attempts and poor IV access   ?  Skin Prep: chlorhexidine gluconate   ?  Location:  Left AC ?  Angiocath:  20 G ?  Bedside Ultrasound Guided: Yes   ?  Images: not archived   ?  Patient tolerated procedure without complications: No   ?  Dressing applied: Yes    ? ? ?Medications Ordered in ED ?Medications  ?alum & mag hydroxide-simeth (MAALOX/MYLANTA) 200-200-20 MG/5ML suspension 30 mL (30 mLs Oral Given 04/01/21 1010)  ?  And  ?lidocaine (XYLOCAINE) 2 % viscous mouth solution 15 mL (15 mLs Oral Given 04/01/21 1010)  ?sodium chloride 0.9 % bolus 500 mL (0 mLs Intravenous Stopped 04/01/21 1123)  ?ondansetron (ZOFRAN) injection 4 mg (4 mg Intravenous Given 04/01/21 1011)  ?morphine (PF) 4 MG/ML injection 4 mg (4 mg Intravenous Given 04/01/21 1010)  ?fentaNYL (SUBLIMAZE) injection 75 mcg (75 mcg Intravenous Given 04/01/21 1118)  ?metoCLOPramide (REGLAN) injection 10 mg (10 mg Intravenous Given 04/01/21 1119)  ?famotidine (PEPCID) IVPB 20 mg premix (20 mg Intravenous New Bag/Given 04/01/21 1127)  ? ? ?ED Course/ Medical Decision Making/ A&P ?  ?                        ?Medical Decision Making ?Amount and/or  Complexity of Data Reviewed ?Labs: ordered. ?Radiology: ordered. ? ?Risk ?OTC drugs. ?Prescription drug management. ?Decision regarding hospitalization. ? ? ?61 year old male presents emergency department concern

## 2021-04-01 NOTE — ED Notes (Signed)
MD Horton requested to bedside for pts ongoing chest pain.  ?

## 2021-04-02 ENCOUNTER — Observation Stay (HOSPITAL_BASED_OUTPATIENT_CLINIC_OR_DEPARTMENT_OTHER): Payer: Medicare HMO

## 2021-04-02 DIAGNOSIS — K529 Noninfective gastroenteritis and colitis, unspecified: Secondary | ICD-10-CM | POA: Diagnosis not present

## 2021-04-02 DIAGNOSIS — I2510 Atherosclerotic heart disease of native coronary artery without angina pectoris: Secondary | ICD-10-CM

## 2021-04-02 LAB — ECHOCARDIOGRAM COMPLETE
Area-P 1/2: 3.77 cm2
Calc EF: 56.6 %
Height: 67 in
S' Lateral: 3.1 cm
Single Plane A2C EF: 57.1 %
Single Plane A4C EF: 50.4 %
Weight: 2977.09 oz

## 2021-04-02 LAB — MRSA NEXT GEN BY PCR, NASAL: MRSA by PCR Next Gen: NOT DETECTED

## 2021-04-02 LAB — COMPREHENSIVE METABOLIC PANEL
ALT: 24 U/L (ref 0–44)
AST: 13 U/L — ABNORMAL LOW (ref 15–41)
Albumin: 3.3 g/dL — ABNORMAL LOW (ref 3.5–5.0)
Alkaline Phosphatase: 57 U/L (ref 38–126)
Anion gap: 9 (ref 5–15)
BUN: 12 mg/dL (ref 8–23)
CO2: 22 mmol/L (ref 22–32)
Calcium: 8.6 mg/dL — ABNORMAL LOW (ref 8.9–10.3)
Chloride: 107 mmol/L (ref 98–111)
Creatinine, Ser: 0.93 mg/dL (ref 0.61–1.24)
GFR, Estimated: >60 mL/min (ref >60)
Glucose, Bld: 119 mg/dL — ABNORMAL HIGH (ref 70–99)
Potassium: 3.9 mmol/L (ref 3.5–5.1)
Sodium: 138 mmol/L (ref 135–145)
Total Bilirubin: 0.8 mg/dL (ref 0.3–1.2)
Total Protein: 5.9 g/dL — ABNORMAL LOW (ref 6.5–8.1)

## 2021-04-02 LAB — CBC
HCT: 39.3 % (ref 39.0–52.0)
Hemoglobin: 13.6 g/dL (ref 13.0–17.0)
MCH: 31.7 pg (ref 26.0–34.0)
MCHC: 34.6 g/dL (ref 30.0–36.0)
MCV: 91.6 fL (ref 80.0–100.0)
Platelets: 323 10*3/uL (ref 150–400)
RBC: 4.29 MIL/uL (ref 4.22–5.81)
RDW: 12.3 % (ref 11.5–15.5)
WBC: 11.3 10*3/uL — ABNORMAL HIGH (ref 4.0–10.5)
nRBC: 0 % (ref 0.0–0.2)

## 2021-04-02 MED ORDER — HYDROMORPHONE HCL 1 MG/ML IJ SOLN: 0.5000 mg | INTRAMUSCULAR | Status: DC | PRN

## 2021-04-02 MED ORDER — PANTOPRAZOLE SODIUM 40 MG PO TBEC
40.0000 mg | DELAYED_RELEASE_TABLET | Freq: Every day | ORAL | Status: DC
  Administered 2021-04-02: 40 mg via ORAL
  Filled 2021-04-02: qty 1

## 2021-04-02 MED ORDER — RANOLAZINE ER 500 MG PO TB12
500.0000 mg | ORAL_TABLET | Freq: Two times a day (BID) | ORAL | Status: DC
  Administered 2021-04-02: 500 mg via ORAL
  Filled 2021-04-02: qty 1

## 2021-04-02 NOTE — Progress Notes (Signed)
?  Echocardiogram ?2D Echocardiogram has been performed. ? ?Troy French ?04/02/2021, 11:20 AM ?

## 2021-04-02 NOTE — Progress Notes (Deleted)
? ?Name: Troy French ?MRN: [id number] ?DOB: 10/07/1960 61 y.o. ?PCP: [name]., FNP ? ?Date of Admission: 04/01/2021  9:02 AM ?Date of Discharge:   04/02/21 ?Attending Physician: Gust Rung, DO ? ?Discharge Diagnosis: ?1. Enteritis, nausea and vomiting; resolved ?2. CAD status post stents ?3. COPD ?4. GERD ?5. HTN/HLD ?6. Anxiety ?7. Chronic back pain ?8. Tobacco use Disorder ?7. Recent dog bite ? ? ?Discharge Medications: ?Allergies as of 04/02/2021   ? ?   Reactions  ? Nitroglycerin Nausea And Vomiting  ? SWEATING  ? Isosorbide Dinitrate Nausea And Vomiting  ? ?  ? ?  ?Medication List  ?  ? ?TAKE these medications   ? ?albuterol 108 (90 Base) MCG/ACT inhaler ?Commonly known as: VENTOLIN HFA ?Inhale 2 puffs into the lungs every 6 (six) hours as needed for wheezing or shortness of breath. ?  ?atorvastatin 40 MG tablet ?Commonly known as: LIPITOR ?Take 1 tablet (40 mg total) by mouth daily. ?  ?clopidogrel 75 MG tablet ?Commonly known as: PLAVIX ?Take 1 tablet (75 mg total) by mouth daily. ?  ?cyclobenzaprine 10 MG tablet ?Commonly known as: FLEXERIL ?Take 10 mg by mouth 3 (three) times daily. ?  ?diazepam 2 MG tablet ?Commonly known as: VALIUM ?Take 2 mg by mouth every 6 (six) hours as needed for anxiety. ?  ?diltiazem 240 MG 24 hr capsule ?Commonly known as: DILACOR XR ?Take 1 capsule (240 mg total) by mouth daily. ?  ?famotidine 20 MG tablet ?Commonly known as: PEPCID ?Take 20 mg by mouth 2 (two) times daily. ?  ?lisinopril 10 MG tablet ?Commonly known as: ZESTRIL ?Take 1 tablet (10 mg total) by mouth daily. ?  ?loperamide 2 MG capsule ?Commonly known as: IMODIUM ?Take 1 capsule (2 mg total) by mouth 4 (four) times daily as needed for diarrhea or loose stools. ?  ?metoprolol succinate 50 MG 24 hr tablet ?Commonly known as: TOPROL-XL ?Take 50 mg by mouth daily. Take with or immediately following a meal. ?  ?morphine 30 MG 12 hr tablet ?Commonly known as: MS CONTIN ?Take 30 mg by mouth daily. ?   ?oxyCODONE-acetaminophen 10-325 MG tablet ?Commonly known as: PERCOCET ?Take 1 tablet by mouth 3 (three) times daily as needed. ?  ?pantoprazole 40 MG tablet ?Commonly known as: PROTONIX ?Take 1 tablet (40 mg total) by mouth daily. ?  ?prednisoLONE acetate 1 % ophthalmic suspension ?Commonly known as: PRED FORTE ?Place 1 drop into the left eye in the morning, at noon, in the evening, and at bedtime. ?  ?ranolazine 500 MG 12 hr tablet ?Commonly known as: RANEXA ?Take 1 tablet (500 mg total) by mouth 2 (two) times daily. ?What changed: when to take this ?  ?Spiriva Respimat 2.5 MCG/ACT Aers ?Generic drug: Tiotropium Bromide Monohydrate ?Inhale 2 puffs into the lungs daily. ?  ?Trelegy Ellipta 100-62.5-25 MCG/ACT Aepb ?Generic drug: Fluticasone-Umeclidin-Vilant ?Inhale 1 puff into the lungs daily. ?  ?zolpidem 10 MG tablet ?Commonly known as: AMBIEN ?Take 10 mg by mouth at bedtime. ?  ? ?  ? ? ?Disposition and follow-up:   ?Troy French was discharged from [facility] in Stable condition.  At the hospital follow up visit please address: ? ?1.  N/V/D: make sure patient is taking in adequate PO intake ? ?2. Dog bite- recheck wound ? ?2.  Labs / imaging needed at time of follow-up: consider CBC depending on wound ? ?3.  Pending labs/ test needing follow-up: Echo ? ?Follow-up Appointments: ? ? ?  Hospital Course by problem list: ?1.  Enteritis, nausea and vomiting; resolved ?Patient came in with in a 3 day history of n/v/d citing NBNB emesis and non-bloody watery diarrhea with >12 episodes a day. CTAP showed concern for enteritis as well as possible developing partial SBO. He was given fluids and antiemetics in the ED however he still had difficulty keeping down anything and so he was admitted for overnight observation. He was initially tachycardic so he was given IVF until his oral intake improved. His diet was advanced and he was able to tolerate a regular diet by 3/25. His leukocytosis was improving  by time of discharge. He did not have Cdiff testing or GI PCR collected because he had no further episodes of diarrhea. SBO was less likely as patient's symptoms resolved and he was able to tolerate food. He will follow up with his PCP in about a week.  ? ?2. CAD status post stents ?Patient complained of chest pain and was diaphoretic and uncomfortable on admission. However EKG was unchanged, troponins were negative, and CT did not show any acute pathology. Upon further clarification this appears to be an acute on chronic pain that has been going on for some time. He will follow up with his heart doctor as an outpatient. Echo was obtained but not yet read yet upon DC. Will follow up on these results and contact his PCP once they are back as needed for any unexpected findings. Continued home medications Ranexa 500mg  BID, diltiazem 240mg  daily, metoprolol 50mg  daily, Plavix 75mg  daily  ?  ?3. COPD ?Continued home Incruse, Ellipta, Albuterol ? ?4. 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 Wounds were mildly tender with only mild erythema, low suspicion for cellulitis. No purulence, streaking, or fluctuance noted on exam. Will have him follow up with PCP.  ?  ?5. GERD ?Continue home medications famotidine  20mg  BID and pantoprazole 40 daily ?  ?6. HTN/HLD ?Continued home medications atorvastatin 40mg  daily and lisinopril 10 mg daily ?  ?7. Anxiety ?Continued home medications diazepam 2mg  q6hrs prn and zolpidem 10mg  qhs ?  ?8. Chronic back pain  ?Patient with chronic back pain, on extensive regimen. Added on home meds PRN morphine 15mg  bid percocet 10-325 mg TID as needed ?  ?9. Tobacco use ?Patient with extensive history of tobacco use, currently not expressing wish to quit. Will continue cessation counseling while inpatient and gave nicotine patch. ? ?Discharge Exam:   ?BP 109/73 (BP Location: Right Arm)   Pulse 89   Temp 98.3 ?F (36.8 ?C)  (Oral)   Resp 19   Ht 5\' 7"  (1.702 m)   Wt 84.4 kg   SpO2 96%   BMI 29.14 kg/m?  ?Discharge exam:  ?Constitutional: middle aged man resting comfortably in bed, in no acute distress ?HEENT: normocephalic atraumatic, mucous membranes moist, conjunctiva non-erythematous ?Cardiovascular: regular rate and rhythm, no m/r/g ?Pulmonary/Chest: normal work of breathing on room air, lungs clear to auscultation bilaterally  ?Abdominal: soft, non-tender, non-distended, normal bowel sounds ?MSK: normal bulk and tone ?Neurological: alert & oriented x 3, answering questions appropriately ?Skin: healing bite wounds on the R and L legs with decreased erythema and no fluctuance or purulence ?Psych: normal affect ?  ? ?Pertinent Labs, Studies, and Procedures:  ? ?DG Chest 2 View ? ?Result Date: 04/01/2021 ?CLINICAL DATA:  Chest pain, shortness of breath EXAM: CHEST - 2 VIEW COMPARISON:  10/21/2019 FINDINGS: The heart  size and mediastinal contours are within normal limits. Prior coronary artery stenting. Mildly coarsened interstitial markings bilaterally, similar to prior. No focal airspace consolidation, pleural effusion, or pneumothorax. The visualized skeletal structures are unremarkable. IMPRESSION: Chronic bronchitic type lung changes. No focal airspace consolidation. Electronically Signed   By: Nicholas  Plundo D.O.   On: 04/01/2021 09:56  ? ?CT Angio Chest/Abd/Pel for Dissection W and/or W/WO ? ?Result Date: 04/01/2021 ?CLINICAL DATA:  Chest and back pain, aortic dissection suspected. Epigastric abdominal pain with nausea vomiting diarrhea. EXAM: CT ANGIOGRAPHY CHEST, ABDOMEN AND PELVIS TECHNIQUE: Non-contrast CT of the chest was initially obtained. Multidetector CT imaging through the chest, abdomen and pelvis was performed using the standard protocol during bolus administration of intravenous contrast. Multiplanar reconstructed images and MIPs were obtained and reviewed to evaluate the vascular anatomy. RADIATION DOSE  REDUCTION: This exam was performed according to the departmental dose-optimization program which includes automated exposure control, adjustment of the mA and/or kV according to patient size and/or use of iterative reconstruction

## 2021-04-02 NOTE — Plan of Care (Signed)

## 2021-04-02 NOTE — Progress Notes (Signed)
? ? ?HD#0 ?Subjective:  ?Overnight Events: NAEO ? ? Patient is worried about his dog at home. Still not feeling very hungry but has not had a lot of nausea and has had no diarrhea or vomiting overnight. He feels ok to try foods today and wants to get his home medications.  ? ?Objective:  ?Vital signs in last 24 hours: ?Vitals:  ? 04/01/21 1958 04/01/21 2339 04/02/21 0412 04/02/21 0806  ?BP: 121/86 125/75 131/80 136/82  ?Pulse: (!) 104 74 85 99  ?Resp: 14 20 20 18   ?Temp: 97.9 ?F (36.6 ?C) 98.2 ?F (36.8 ?C) 98 ?F (36.7 ?C) 97.7 ?F (36.5 ?C)  ?TempSrc: Oral Oral Oral Oral  ?SpO2: 97% 99% 98% 97%  ?Weight:      ?Height:      ? ?Supplemental O2: Room Air ?SpO2: 97 % ? ? ?Physical Exam:  ?Constitutional: middle aged man resting comfortably in bed, in no acute distress ?HEENT: normocephalic atraumatic, mucous membranes moist, conjunctiva non-erythematous ?Cardiovascular: regular rate and rhythm, no m/r/g ?Pulmonary/Chest: normal work of breathing on room air, lungs clear to auscultation bilaterally  ?Abdominal: soft, non-tender, non-distended, normal bowel sounds ?MSK: normal bulk and tone ?Neurological: alert & oriented x 3, answering questions appropriately ?Skin: healing bite wounds on the R and L legs with decreased erythema and no fluctuance or purulence ?Psych: normal affect ? ?Filed Weights  ? 04/01/21 0911  ?Weight: 84.4 kg  ? ? ? ?Intake/Output Summary (Last 24 hours) at 04/02/2021 0831 ?Last data filed at 04/02/2021 0400 ?Gross per 24 hour  ?Intake 783.13 ml  ?Output 450 ml  ?Net 333.13 ml  ? ?Net IO Since Admission: 333.13 mL [04/02/21 0831] ? ?Pertinent Labs: ? ?  Latest Ref Rng & Units 04/02/2021  ?  5:40 AM 04/01/2021  ?  9:22 AM 10/21/2019  ?  3:35 PM  ?CBC  ?WBC 4.0 - 10.5 K/uL 11.3   14.4   17.0    ?Hemoglobin 13.0 - 17.0 g/dL 12/21/2019   26.9   48.5    ?Hematocrit 39.0 - 52.0 % 39.3   48.5   48.9    ?Platelets 150 - 400 K/uL 323   368   342    ? ? ? ?  Latest Ref Rng & Units 04/02/2021  ?  5:40 AM 04/01/2021  ?  10:56 AM 04/01/2021  ?  9:22 AM  ?CMP  ?Glucose 70 - 99 mg/dL 04/03/2021    703    ?BUN 8 - 23 mg/dL 12    11    ?Creatinine 0.61 - 1.24 mg/dL 500    9.38    ?Sodium 135 - 145 mmol/L 138    137    ?Potassium 3.5 - 5.1 mmol/L 3.9    4.2    ?Chloride 98 - 111 mmol/L 107    103    ?CO2 22 - 32 mmol/L 22    19    ?Calcium 8.9 - 10.3 mg/dL 8.6    1.82    ?Total Protein 6.5 - 8.1 g/dL 5.9   7.0     ?Total Bilirubin 0.3 - 1.2 mg/dL 0.8   1.1     ?Alkaline Phos 38 - 126 U/L 57   74     ?AST 15 - 41 U/L 13   19     ?ALT 0 - 44 U/L 24   34     ? ? ?Imaging: ?DG Chest 2 View ? ?Result Date: 04/01/2021 ?CLINICAL DATA:  Chest pain, shortness of  breath EXAM: CHEST - 2 VIEW COMPARISON:  10/21/2019 FINDINGS: The heart size and mediastinal contours are within normal limits. Prior coronary artery stenting. Mildly coarsened interstitial markings bilaterally, similar to prior. No focal airspace consolidation, pleural effusion, or pneumothorax. The visualized skeletal structures are unremarkable. IMPRESSION: Chronic bronchitic type lung changes. No focal airspace consolidation. Electronically Signed   By: Duanne GuessNicholas  Plundo D.O.   On: 04/01/2021 09:56  ? ?CT Angio Chest/Abd/Pel for Dissection W and/or W/WO ? ?Result Date: 04/01/2021 ?CLINICAL DATA:  Chest and back pain, aortic dissection suspected. Epigastric abdominal pain with nausea vomiting diarrhea. EXAM: CT ANGIOGRAPHY CHEST, ABDOMEN AND PELVIS TECHNIQUE: Non-contrast CT of the chest was initially obtained. Multidetector CT imaging through the chest, abdomen and pelvis was performed using the standard protocol during bolus administration of intravenous contrast. Multiplanar reconstructed images and MIPs were obtained and reviewed to evaluate the vascular anatomy. RADIATION DOSE REDUCTION: This exam was performed according to the departmental dose-optimization program which includes automated exposure control, adjustment of the mA and/or kV according to patient size and/or use of iterative  reconstruction technique. CONTRAST:  100mL OMNIPAQUE IOHEXOL 350 MG/ML SOLN COMPARISON:  MRI spine November 05, 2019. FINDINGS: CTA CHEST FINDINGS Cardiovascular: Noncontrast sequence demonstrates aortic atherosclerosis without intramural hematoma. Preferential opacification of the thoracic aorta. No evidence of thoracic aortic aneurysm or dissection. No central pulmonary embolus on this nondedicated study. Normal heart size. Three-vessel coronary artery calcifications. No significant pericardial effusion/thickening. Mediastinum/Nodes: No discrete thyroid nodule. Calcified mediastinal and right hilar lymph nodes. No pathologically enlarged mediastinal, hilar or axillary lymph nodes. Mild esophageal wall thickening. Lungs/Pleura: Diffuse bronchial wall thickening with mosaic attenuation of the lungs. Partially calcified 6 mm right lower lobe pulmonary nodule. Scattered areas of subsegmental atelectasis versus scarring. No pleural effusion. No pneumothorax. Musculoskeletal: Multilevel degenerative changes spine. No acute osseous abnormality. Review of the MIP images confirms the above findings. CTA ABDOMEN AND PELVIS FINDINGS VASCULAR Aorta: Aortic atherosclerosis. Normal caliber aorta without aneurysm, dissection, vasculitis or significant stenosis. Celiac: Patent without evidence of aneurysm, dissection, vasculitis or significant stenosis. SMA: Patent without evidence of aneurysm, dissection, vasculitis or significant stenosis. Renals: Both renal arteries are patent without evidence of aneurysm, dissection, vasculitis, fibromuscular dysplasia or significant stenosis. IMA: Patent without evidence of aneurysm, dissection, vasculitis or significant stenosis. Inflow: Patent without evidence of aneurysm, dissection, vasculitis or significant stenosis. Veins: No obvious venous abnormality within the limitations of this arterial phase study. Review of the MIP images confirms the above findings. NON-VASCULAR Hepatobiliary:  No suspicious hepatic lesion. Gallbladder is unremarkable. No biliary ductal dilation. Pancreas: No pancreatic ductal dilation or evidence of acute inflammation. Spleen: No splenomegaly or focal splenic lesion. Adrenals/Urinary Tract: Bilateral adrenal glands appear normal. No hydronephrosis. 2.5 cm left lower pole renal cyst. Urinary bladder is unremarkable for degree of distension. Stomach/Bowel: No enteric contrast was administered. Stomach is distended with ingested material and gas without abnormal wall thickening. Prominent loops fluid and gas-filled small bowel in the abdomen with some associated wall thickening. No abrupt transition to decompressed bowel. Distal small bowel as well as the predominance of the colon is decompressed with a small volume of stool in the proximal colon. Normal appendix. Lymphatic: No pathologically enlarged abdominal or pelvic lymph nodes. Reproductive: Prostate is unremarkable. Other: No significant abdominopelvic free fluid. Musculoskeletal: Multilevel degenerative changes spine. No acute osseous abnormality. Review of the MIP images confirms the above findings. IMPRESSION: 1. No evidence of thoracic or abdominal aortic aneurysm or dissection. 2. Diffuse bronchial wall thickening with mosaic  attenuation of the lungs, suggestive of small airways disease. 3. Prominent loops of fluid and gas-filled small bowel in the upper abdomen with some associated wall thickening. No abrupt transition to decompressed bowel. Findings are suggestive of enteritis. Although early/partial small bowel obstruction while less likely is a differential consideration. 4. Mild esophageal wall thickening, which can be seen in the setting of esophagitis. 5. Sequela of prior granulomatous disease with calcified right lower lobe pulmonary nodule and calcified mediastinal and right hilar lymph nodes. 6.  Aortic Atherosclerosis (ICD10-I70.0). Electronically Signed   By: Maudry Mayhew M.D.   On: 04/01/2021 14:39    ? ?Assessment/Plan:  ? ?Principal Problem: ?  Enteritis ? ? ?Patient Summary: ?Troy French is a 61 y.o.  with past medical history of CAD status post stents, CHF, COPD, GERD presenting today with epiga

## 2021-04-02 NOTE — Discharge Instructions (Signed)
Lynett Fish ? ?You were recently admitted to [facility] for nausea, vomiting, and diarrhea. We gave you medications to help with your symptoms and IV fluids to rehydrate you. Now that you are feeling better continue to eat and drink to make sure you don't get dehydrated and to help your body recover.  ? ?Continue taking your home medications as we did not make any changes. ? ? ?You should seek further medical care if you are feeling lightheaded or dizzy, you have inability to keep down your medications or eat or drink, worsening diarrhea, or your bite wounds become red, more painful, or start draining. ? ?We recommend that you see your primary care doctor in about a week to make sure that you continue to improve. We are so glad that you are feeling better. ? ?Sincerely, ?Ilene Qua, MD ? ? ?

## 2021-04-02 NOTE — TOC CM/SW Note (Signed)
Cab voucher provided to nursing unit for patient's transportation home. No further TOC needs identified.  ? ?Raiford Noble, MSN, RN,BSN ?[facility] Case Manager ?[phone]   ?

## 2021-04-02 NOTE — Plan of Care (Signed)

## 2021-04-02 NOTE — Progress Notes (Signed)
Date and time results received: 04/01/2021 11:49 pm ? ? ?Test: Lactic Acid ?Critical Value: 2.0 ? ?Name of Provider Notified: Internal Med. Teaching Service on-call ? ?Orders Received? Or Actions Taken?:  No Orders given at this time.  ?

## 2021-04-03 NOTE — Discharge Summary (Signed)
? ?Name: Troy French ?MRN: [id number] ?DOB: 10/07/1960 61 y.o. ?PCP: [name]., FNP ? ?Date of Admission: 04/01/2021  9:02 AM ?Date of Discharge:   04/02/21 ?Attending Physician: Gust Rung, DO ? ?Discharge Diagnosis: ?1. Enteritis, nausea and vomiting; resolved ?2. CAD status post stents ?3. COPD ?4. GERD ?5. HTN/HLD ?6. Anxiety ?7. Chronic back pain ?8. Tobacco use Disorder ?7. Recent dog bite ? ? ?Discharge Medications: ?Allergies as of 04/02/2021   ? ?   Reactions  ? Nitroglycerin Nausea And Vomiting  ? SWEATING  ? Isosorbide Dinitrate Nausea And Vomiting  ? ?  ? ?  ?Medication List  ?  ? ?TAKE these medications   ? ?albuterol 108 (90 Base) MCG/ACT inhaler ?Commonly known as: VENTOLIN HFA ?Inhale 2 puffs into the lungs every 6 (six) hours as needed for wheezing or shortness of breath. ?  ?atorvastatin 40 MG tablet ?Commonly known as: LIPITOR ?Take 1 tablet (40 mg total) by mouth daily. ?  ?clopidogrel 75 MG tablet ?Commonly known as: PLAVIX ?Take 1 tablet (75 mg total) by mouth daily. ?  ?cyclobenzaprine 10 MG tablet ?Commonly known as: FLEXERIL ?Take 10 mg by mouth 3 (three) times daily. ?  ?diazepam 2 MG tablet ?Commonly known as: VALIUM ?Take 2 mg by mouth every 6 (six) hours as needed for anxiety. ?  ?diltiazem 240 MG 24 hr capsule ?Commonly known as: DILACOR XR ?Take 1 capsule (240 mg total) by mouth daily. ?  ?famotidine 20 MG tablet ?Commonly known as: PEPCID ?Take 20 mg by mouth 2 (two) times daily. ?  ?lisinopril 10 MG tablet ?Commonly known as: ZESTRIL ?Take 1 tablet (10 mg total) by mouth daily. ?  ?loperamide 2 MG capsule ?Commonly known as: IMODIUM ?Take 1 capsule (2 mg total) by mouth 4 (four) times daily as needed for diarrhea or loose stools. ?  ?metoprolol succinate 50 MG 24 hr tablet ?Commonly known as: TOPROL-XL ?Take 50 mg by mouth daily. Take with or immediately following a meal. ?  ?morphine 30 MG 12 hr tablet ?Commonly known as: MS CONTIN ?Take 30 mg by mouth daily. ?   ?oxyCODONE-acetaminophen 10-325 MG tablet ?Commonly known as: PERCOCET ?Take 1 tablet by mouth 3 (three) times daily as needed. ?  ?pantoprazole 40 MG tablet ?Commonly known as: PROTONIX ?Take 1 tablet (40 mg total) by mouth daily. ?  ?prednisoLONE acetate 1 % ophthalmic suspension ?Commonly known as: PRED FORTE ?Place 1 drop into the left eye in the morning, at noon, in the evening, and at bedtime. ?  ?ranolazine 500 MG 12 hr tablet ?Commonly known as: RANEXA ?Take 1 tablet (500 mg total) by mouth 2 (two) times daily. ?What changed: when to take this ?  ?Spiriva Respimat 2.5 MCG/ACT Aers ?Generic drug: Tiotropium Bromide Monohydrate ?Inhale 2 puffs into the lungs daily. ?  ?Trelegy Ellipta 100-62.5-25 MCG/ACT Aepb ?Generic drug: Fluticasone-Umeclidin-Vilant ?Inhale 1 puff into the lungs daily. ?  ?zolpidem 10 MG tablet ?Commonly known as: AMBIEN ?Take 10 mg by mouth at bedtime. ?  ? ?  ? ? ?Disposition and follow-up:   ?Troy French was discharged from [facility] in Stable condition.  At the hospital follow up visit please address: ? ?1.  N/V/D: make sure patient is taking in adequate PO intake ? ?2. Dog bite- recheck wound ? ?2.  Labs / imaging needed at time of follow-up: consider CBC depending on wound ? ?3.  Pending labs/ test needing follow-up: Echo ? ?Follow-up Appointments: ? ? ?  Hospital Course by problem list: ?1.  Enteritis, nausea and vomiting; resolved ?Patient came in with in a 3 day history of n/v/d citing NBNB emesis and non-bloody watery diarrhea with >12 episodes a day. CTAP showed concern for enteritis as well as possible developing partial SBO. He was given fluids and antiemetics in the ED however he still had difficulty keeping down anything and so he was admitted for overnight observation. He was initially tachycardic so he was given IVF until his oral intake improved. His diet was advanced and he was able to tolerate a regular diet by 3/25. His leukocytosis was improving  by time of discharge. He did not have Cdiff testing or GI PCR collected because he had no further episodes of diarrhea. SBO was less likely as patient's symptoms resolved and he was able to tolerate food. He will follow up with his PCP in about a week.  ? ?2. CAD status post stents ?Patient complained of chest pain and was diaphoretic and uncomfortable on admission. However EKG was unchanged, troponins were negative, and CT did not show any acute pathology. Upon further clarification this appears to be an acute on chronic pain that has been going on for some time. He will follow up with his heart doctor as an outpatient. Echo was obtained but not yet read yet upon DC. Will follow up on these results and contact his PCP once they are back as needed for any unexpected findings. Continued home medications Ranexa 500mg  BID, diltiazem 240mg  daily, metoprolol 50mg  daily, Plavix 75mg  daily  ?  ?3. COPD ?Continued home Incruse, Ellipta, Albuterol ? ?4. 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 Wounds were mildly tender with only mild erythema, low suspicion for cellulitis. No purulence, streaking, or fluctuance noted on exam. Will have him follow up with PCP.  ?  ?5. GERD ?Continue home medications famotidine  20mg  BID and pantoprazole 40 daily ?  ?6. HTN/HLD ?Continued home medications atorvastatin 40mg  daily and lisinopril 10 mg daily ?  ?7. Anxiety ?Continued home medications diazepam 2mg  q6hrs prn and zolpidem 10mg  qhs ?  ?8. Chronic back pain  ?Patient with chronic back pain, on extensive regimen. Added on home meds PRN morphine 15mg  bid percocet 10-325 mg TID as needed ?  ?9. Tobacco use ?Patient with extensive history of tobacco use, currently not expressing wish to quit. Will continue cessation counseling while inpatient and gave nicotine patch. ? ?Discharge Exam:   ?BP 109/73 (BP Location: Right Arm)   Pulse 89   Temp 98.3 ?F (36.8 ?C)  (Oral)   Resp 19   Ht 5\' 7"  (1.702 m)   Wt 84.4 kg   SpO2 96%   BMI 29.14 kg/m?  ?Discharge exam:  ?Constitutional: middle aged man resting comfortably in bed, in no acute distress ?HEENT: normocephalic atraumatic, mucous membranes moist, conjunctiva non-erythematous ?Cardiovascular: regular rate and rhythm, no m/r/g ?Pulmonary/Chest: normal work of breathing on room air, lungs clear to auscultation bilaterally  ?Abdominal: soft, non-tender, non-distended, normal bowel sounds ?MSK: normal bulk and tone ?Neurological: alert & oriented x 3, answering questions appropriately ?Skin: healing bite wounds on the R and L legs with decreased erythema and no fluctuance or purulence ?Psych: normal affect ?  ? ?Pertinent Labs, Studies, and Procedures:  ? ?DG Chest 2 View ? ?Result Date: 04/01/2021 ?CLINICAL DATA:  Chest pain, shortness of breath EXAM: CHEST - 2 VIEW COMPARISON:  10/21/2019 FINDINGS: The heart  size and mediastinal contours are within normal limits. Prior coronary artery stenting. Mildly coarsened interstitial markings bilaterally, similar to prior. No focal airspace consolidation, pleural effusion, or pneumothorax. The visualized skeletal structures are unremarkable. IMPRESSION: Chronic bronchitic type lung changes. No focal airspace consolidation. Electronically Signed   By: Duanne GuessNicholas  Plundo D.O.   On: 04/01/2021 09:56  ? ?CT Angio Chest/Abd/Pel for Dissection W and/or W/WO ? ?Result Date: 04/01/2021 ?CLINICAL DATA:  Chest and back pain, aortic dissection suspected. Epigastric abdominal pain with nausea vomiting diarrhea. EXAM: CT ANGIOGRAPHY CHEST, ABDOMEN AND PELVIS TECHNIQUE: Non-contrast CT of the chest was initially obtained. Multidetector CT imaging through the chest, abdomen and pelvis was performed using the standard protocol during bolus administration of intravenous contrast. Multiplanar reconstructed images and MIPs were obtained and reviewed to evaluate the vascular anatomy. RADIATION DOSE  REDUCTION: This exam was performed according to the departmental dose-optimization program which includes automated exposure control, adjustment of the mA and/or kV according to patient size and/or use of iterative reconstruction

## 2021-04-08 NOTE — H&P (Signed)
04/01/2021       ? ?Date: 04/01/2021       ?         ?         ?Patient Name:  Yogesh Cominsky MRN: [id number]  ?DOB: 1960/08/05 Age / Sex: 61 y.o., male   ?PCP: [name]., FNP      ?         ?         ?Medical Service: Internal Medicine Teaching Service      ?         ?         ?Attending Physician: Dr. Mercie Eon, MD      ?First Contact: Leonie Green, MS 3 Pager: [phone]  ?Second Contact: Dr. Ilene Qua Pager: [phone]  ?Third Contact Dr. Merrilyn Puma Pager: [phone]  ?         ?After Hours (After 5p/  First Contact Pager: [phone]  ?weekends / holidays): Second Contact Pager: [phone]  ?  ?Chief Complaint: epigastric pain and nausea/vomiting ?  ?History of Present Illness:  ?Mr. Kassis  is a 61 yo with past medical history of CAD status post stents, CHF, OSA untreated, COPD, GERD presenting today with epigastric pain and nausea/vomiting. ?  ?Patient states he began experiencing worsening nausea/vomiting three days ago. He has been unable to tolerate po and has not been taking medications as a result. He has also been having diarrhea during this time which began two days ago. He endorses subjective fever yesterday, but unsure of actual temperature since he did not check. He has had no chills during this time. Patient states stools have been clear and watery, no blood or dark stools. States he has gone over a dozen times each day. Patient denies changes to diet, no sick contacts, no recent travel, or participated in outdoor activities. 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 He has been cleaning wound with soap and water, and coating with bacitracin ointment. States there has been no swelling or pain to the area, improved since incident. Patient states he feels less nauseous now and pain is better controlled after ED interventions. ?  ?Today he is also endorsing some left sided chest pain that is worsened with exertion. He states there is some discomfort with vomiting  as well. No pain with inspirations. States pain has been ongoing and unchanged for 2-3 months now. Patient states he was worried about possibly worsened CAD given previous episodes presenting with nausea and vomiting. Unsure if today's pain is similar to past episodes.  ?  ?No SOB, palpitations, constipation, dizziness, dysuria, burning with urination, hematuria, hematemesis, dark stools.  ?  ?ED course: CBC: WBC 14.4; BMP, troponin, lipase, hepatic panel, and PT/INR wnl; EKG with no ischemic changes; CT chest/abd/pelvis obtained, concerning for enteritis possible SBO. Patient given 4mg  morphine/75 mcg fentanyl IV x1, NS bolus x1, Zofran, Reglan, Pepcid IV. Attempted a GI cocktail, patient unable to tolerate.  Patient was tachycardic at 1 point, this normalized after IV fluids. ?  ?  ?Meds:  ?Active Medications  ? ?Meds:  ?Active Medications  ?    ?Current Meds  ?Medication Sig  ? albuterol (VENTOLIN HFA) 108 (90 Base) MCG/ACT inhaler Inhale 2 puffs into the lungs every 6 (six) hours as needed for wheezing or shortness of breath.  ? atorvastatin (LIPITOR) 40 MG tablet Take 1 tablet (40 mg total) by mouth daily.  ? clopidogrel (  PLAVIX) 75 MG tablet Take 1 tablet (75 mg total) by mouth daily.  ? cyclobenzaprine (FLEXERIL) 10 MG tablet Take 10 mg by mouth 3 (three) times daily.  ? diazepam (VALIUM) 2 MG tablet Take 2 mg by mouth every 6 (six) hours as needed for anxiety.  ? diltiazem (DILACOR XR) 240 MG 24 hr capsule Take 1 capsule (240 mg total) by mouth daily.  ? famotidine (PEPCID) 20 MG tablet Take 20 mg by mouth 2 (two) times daily.  ? Fluticasone-Umeclidin-Vilant (TRELEGY ELLIPTA) 100-62.5-25 MCG/ACT AEPB Inhale 1 puff into the lungs daily.  ? lisinopril (ZESTRIL) 10 MG tablet Take 1 tablet (10 mg total) by mouth daily.  ? metoprolol succinate (TOPROL-XL) 50 MG 24 hr tablet Take 50 mg by mouth daily. Take with or immediately following a meal.  ? morphine (MS CONTIN) 30 MG 12 hr tablet Take 30 mg by mouth  daily.  ? oxyCODONE-acetaminophen (PERCOCET) 10-325 MG tablet Take 1 tablet by mouth 3 (three) times daily as needed.  ? pantoprazole (PROTONIX) 40 MG tablet Take 1 tablet (40 mg total) by mouth daily.  ? prednisoLONE acetate (PRED FORTE) 1 % ophthalmic suspension Place 1 drop into the left eye in the morning, at noon, in the evening, and at bedtime.  ? ranolazine (RANEXA) 500 MG 12 hr tablet Take 1 tablet (500 mg total) by mouth 2 (two) times daily. (Patient taking differently: Take 500 mg by mouth daily.)  ? zolpidem (AMBIEN) 10 MG tablet Take 10 mg by mouth at bedtime.   ?  ?  ?  ?  ?Allergies: ?     ?Allergies as of 04/01/2021 - Review Complete 04/01/2021  ?Allergen Reaction Noted  ? Nitroglycerin Nausea And Vomiting 08/22/2018  ? Isosorbide dinitrate Nausea And Vomiting 10/28/2019  ?  ?    ?Past Medical History:  ?Diagnosis Date  ? Arthritis    ? CHF (congestive heart failure) (HCC)    ? COPD (chronic obstructive pulmonary disease) (HCC)    ? Coronary artery disease    ? Hyperlipidemia    ? Hypertension    ?  ?  ?Family History:  ?Extensive family history of CAD, Dad passed from heart disease. Brother, uncles, grandfather all with heart disease. Patient had first heart attack at 29. ?  ?Social History:  ?Patient lives alone in [place] with his dog. Son and daughter in law live nearby, states they are his main social support. He currently works part-time job with [employer], but used to work as Music therapistcarpenter, now retired. He is very independent and is able to perform all ADLs and iADLs on his own. He has a 50 pack-year smoking history, currently continues to smoke. He endorses occasional alcohol use. Endorses Marijuana use, 2 weeks straight every couple months. Does not use other recreational drugs. ?  ?Review of Systems: ?A complete ROS was negative except as per HPI.  ?  ?Physical Exam: ?Blood pressure 130/67, pulse 85, temperature 97.9 ?F (36.6 ?C), temperature source Oral, resp. rate 20, height 5\' 7"  (1.702 m),  weight 84.4 kg, SpO2 98 %. ?  ?Physical Exam: ?Constitutional: Middle aged man laying bed, conversant, NAD ?HEENT: atraumatic, normocephalic, dry membranes ?Cardio: Tachycardic, regular rhythm, no murmurs auscultated ?Pulm: CTAB, normal work of breathing on room air, no wheezes ?Abd: soft, non-distended, moderate tenderness to palpation of epigastric region ?Extremities: well-healing bite wounds to left dorsal surface and right mid shin, mild erythema surrounding wounds, non-tender to palpation, no purulent discharge or swelling, decreased skin turgor of lower  extremities ?MSK: normal bulk and tone, moving all extremities spontaneously ?Neuro: Alert and oriented x3, answering questions appropriately ?Psych: pleasant affect ?  ?  ?EKG: personally reviewed my interpretation is sinus arhythmia. ?  ?CXR: personally reviewed my interpretation is trachea at midline, costovertebral angle present bilaterally with no signs of pleural effusion,  some  interstitial markings bilaterally, similar to prior. No evidence of pneumothorax. No bony abnormalities. ?  ?Assessment & Plan by Problem: ?Principal Problem: ?  Enteritis ?  ?Mr. Ellen  is a 61 yo with past medical history of CAD status post stents, CHF, COPD, GERD presenting today with epigastric pain and nausea/vomiting for the past 3 days, he is being admitted today for inability to tolerate po 2/2 enteritis and possible SBO. ?  ?Enteritis ?Possible developing SBO ?N/V ?Patient today with epigastric pain and nausea vomiting for the past three days. He has been unable to tolerate po during this time and has not been taking his medications due to this. Differentials include enteritis vs possible partial SBO vs  infection 2/2 to dog bite. Presentation, leukocytosis, and CT imaging today with prominent loops of fluid and gas-filled small bowel in the upper abdomen with associated wall thickening, are suggestive of enteritis. Will obtain GI path panel and C. diff pcr to look for  source of infection. A developing partial SBO also possible with presentation, but given concurrent and large volume diarrhea think an obstruction is less likely at this time. No high pitched bowel sou

## 2021-05-16 ENCOUNTER — Ambulatory Visit: Payer: Medicare HMO | Admitting: Podiatry

## 2021-05-16 DIAGNOSIS — B351 Tinea unguium: Secondary | ICD-10-CM | POA: Diagnosis not present

## 2021-05-16 DIAGNOSIS — Z79899 Other long term (current) drug therapy: Secondary | ICD-10-CM

## 2021-05-16 DIAGNOSIS — L853 Xerosis cutis: Secondary | ICD-10-CM

## 2021-05-16 MED ORDER — TRIAMCINOLONE ACETONIDE 0.025 % EX OINT: 1.0000 "application " | TOPICAL_OINTMENT | Freq: Two times a day (BID) | CUTANEOUS | 0 refills | Status: DC

## 2021-05-16 NOTE — Patient Instructions (Signed)
Terbinafine Tablets What is this medication? TERBINAFINE (TER bin a feen) treats fungal infections of the nails. It belongs to a group of medications called antifungals. It will not treat infections caused by bacteria or viruses. This medicine may be used for other purposes; ask your health care provider or pharmacist if you have questions. COMMON BRAND NAME(S): Lamisil, Terbinex What should I tell my care team before I take this medication? They need to know if you have any of these conditions: Liver disease An unusual or allergic reaction to terbinafine, other medications, foods, dyes, or preservatives Pregnant or trying to get pregnant Breast-feeding How should I use this medication? Take this medication by mouth with water. Take it as directed on the prescription label at the same time every day. You can take it with or without food. If it upsets your stomach, take it with food. Keep taking it unless your care team tells you to stop. A special MedGuide will be given to you by the pharmacist with each prescription and refill. Be sure to read this information carefully each time. Talk to your care team regarding the use of this medication in children. Special care may be needed. Overdosage: If you think you have taken too much of this medicine contact a poison control center or emergency room at once. NOTE: This medicine is only for you. Do not share this medicine with others. What if I miss a dose? If you miss a dose, take it as soon as you can unless it is more than 4 hours late. If it is more than 4 hours late, skip the missed dose. Take the next dose at the normal time. What may interact with this medication? Do not take this medication with any of the following: Pimozide Thioridazine This medication may also interact with the following: Beta blockers Caffeine Certain medications for mental health conditions Cimetidine Cyclosporine Medications for fungal infections like fluconazole  and ketoconazole Medications for irregular heartbeat like amiodarone, flecainide and propafenone Rifampin Warfarin This list may not describe all possible interactions. Give your health care provider a list of all the medicines, herbs, non-prescription drugs, or dietary supplements you use. Also tell them if you smoke, drink alcohol, or use illegal drugs. Some items may interact with your medicine. What should I watch for while using this medication? Visit your care team for regular checks on your progress. You may need blood work while you are taking this medication. It may be some time before you see the benefit from this medication. This medication may cause serious skin reactions. They can happen weeks to months after starting the medication. Contact your care team right away if you notice fevers or flu-like symptoms with a rash. The rash may be red or purple and then turn into blisters or peeling of the skin. Or, you might notice a red rash with swelling of the face, lips or lymph nodes in your neck or under your arms. This medication can make you more sensitive to the sun. Keep out of the sun, If you cannot avoid being in the sun, wear protective clothing and sunscreen. Do not use sun lamps or tanning beds/booths. What side effects may I notice from receiving this medication? Side effects that you should report to your care team as soon as possible: Allergic reactions--skin rash, itching, hives, swelling of the face, lips, tongue, or throat Change in sense of smell Change in taste Infection--fever, chills, cough, or sore throat Liver injury--right upper belly pain, loss of appetite, nausea,   light-colored stool, dark yellow or brown urine, yellowing skin or eyes, unusual weakness or fatigue Low red blood cell level--unusual weakness or fatigue, dizziness, headache, trouble breathing Lupus-like syndrome--joint pain, swelling, or stiffness, butterfly-shaped rash on the face, rashes that get worse  in the sun, fever, unusual weakness or fatigue Rash, fever, and swollen lymph nodes Redness, blistering, peeling, or loosening of the skin, including inside the mouth Unusual bruising or bleeding Worsening mood, feelings of depression Side effects that usually do not require medical attention (report to your care team if they continue or are bothersome): Diarrhea Gas Headache Nausea Stomach pain Upset stomach This list may not describe all possible side effects. Call your doctor for medical advice about side effects. You may report side effects to FDA at 1-800-FDA-1088. Where should I keep my medication? Keep out of the reach of children and pets. Store between 20 and 25 degrees C (68 and 77 degrees F). Protect from light. Get rid of any unused medication after the expiration date. To get rid of medications that are no longer needed or have expired: Take the medication to a medication take-back program. Check with your pharmacy or law enforcement to find a location. If you cannot return the medication, check the label or package insert to see if the medication should be thrown out in the garbage or flushed down the toilet. If you are not sure, ask your care team. If it is safe to put it in the trash, take the medication out of the container. Mix the medication with cat litter, dirt, coffee grounds, or other unwanted substance. Seal the mixture in a bag or container. Put it in the trash. NOTE: This sheet is a summary. It may not cover all possible information. If you have questions about this medicine, talk to your doctor, pharmacist, or health care provider.  2023 Elsevier/Gold Standard (2020-08-11 00:00:00)  

## 2021-05-18 NOTE — Progress Notes (Signed)
Subjective:  ? ?Patient ID: Troy French, male   DOB: 61 y.o.   MRN: [id number]  ? ?HPI ?61 year old male presents the office today with concerns of nail fungus which start about 6 years ago.  He says nails are thick as well as discolored with yellow discoloration.  At times the left big toenail does hurt as the nail gets ingrown.  No swelling or redness or any drainage to the toenail sites at this time.  He has not had any recent formal treatment he tried over-the-counter medications.  Also has dry skin.  No open lesions that he reports. ? ? ?Review of Systems  ?All other systems reviewed and are negative. ? ?Past Medical History:  ?Diagnosis Date  ? Arthritis   ? CHF (congestive heart failure) (HCC)   ? COPD (chronic obstructive pulmonary disease) (HCC)   ? Coronary artery disease   ? Hyperlipidemia   ? Hypertension   ? ? ?Past Surgical History:  ?Procedure Laterality Date  ? cardiac stents    ? CORONARY ATHERECTOMY N/A 02/10/2019  ? Procedure: CORONARY ATHERECTOMY;  Surgeon: Runell Gess, MD;  Location: [facility] INVASIVE CV LAB;  Service: Cardiovascular;  Laterality: N/A;  Mid RCA  ? CORONARY STENT INTERVENTION N/A 02/10/2019  ? Procedure: CORONARY STENT INTERVENTION;  Surgeon: Runell Gess, MD;  Location: [facility] INVASIVE CV LAB;  Service: Cardiovascular;  Laterality: N/A;  Mid RCA  ? LEFT HEART CATH AND CORONARY ANGIOGRAPHY N/A 02/10/2019  ? Procedure: LEFT HEART CATH AND CORONARY ANGIOGRAPHY;  Surgeon: Runell Gess, MD;  Location: [facility] INVASIVE CV LAB;  Service: Cardiovascular;  Laterality: N/A;  ? TEMPORARY PACEMAKER N/A 02/10/2019  ? Procedure: TEMPORARY PACEMAKER;  Surgeon: Runell Gess, MD;  Location: [facility] INVASIVE CV LAB;  Service: Cardiovascular;  Laterality: N/A;  ? ? ? ?Current Outpatient Medications:  ?  triamcinolone (KENALOG) 0.025 % ointment, Apply 1 application. topically 2 (two) times daily., Disp: 30 g, Rfl: 0 ?  albuterol (VENTOLIN HFA) 108 (90 Base) MCG/ACT inhaler, Inhale 2 puffs into the lungs  every 6 (six) hours as needed for wheezing or shortness of breath., Disp: , Rfl:  ?  atorvastatin (LIPITOR) 40 MG tablet, Take 1 tablet (40 mg total) by mouth daily., Disp: 90 tablet, Rfl: 3 ?  clopidogrel (PLAVIX) 75 MG tablet, Take 1 tablet (75 mg total) by mouth daily., Disp: 90 tablet, Rfl: 3 ?  cyclobenzaprine (FLEXERIL) 10 MG tablet, Take 10 mg by mouth 3 (three) times daily., Disp: , Rfl:  ?  diazepam (VALIUM) 2 MG tablet, Take 2 mg by mouth every 6 (six) hours as needed for anxiety., Disp: , Rfl:  ?  diltiazem (DILACOR XR) 240 MG 24 hr capsule, Take 1 capsule (240 mg total) by mouth daily., Disp: 90 capsule, Rfl: 3 ?  famotidine (PEPCID) 20 MG tablet, Take 20 mg by mouth 2 (two) times daily., Disp: , Rfl:  ?  Fluticasone-Umeclidin-Vilant (TRELEGY ELLIPTA) 100-62.5-25 MCG/ACT AEPB, Inhale 1 puff into the lungs daily., Disp: , Rfl:  ?  lisinopril (ZESTRIL) 10 MG tablet, Take 1 tablet (10 mg total) by mouth daily., Disp: 90 tablet, Rfl: 3 ?  loperamide (IMODIUM) 2 MG capsule, Take 1 capsule (2 mg total) by mouth 4 (four) times daily as needed for diarrhea or loose stools. (Patient not taking: Reported on 04/01/2021), Disp: 8 capsule, Rfl: 0 ?  metoprolol succinate (TOPROL-XL) 50 MG 24 hr tablet, Take 50 mg by mouth daily. Take with or immediately following a meal., Disp: ,  Rfl:  ?  morphine (MS CONTIN) 30 MG 12 hr tablet, Take 30 mg by mouth daily., Disp: , Rfl:  ?  oxyCODONE-acetaminophen (PERCOCET) 10-325 MG tablet, Take 1 tablet by mouth 3 (three) times daily as needed., Disp: , Rfl:  ?  pantoprazole (PROTONIX) 40 MG tablet, Take 1 tablet (40 mg total) by mouth daily., Disp: 30 tablet, Rfl: 1 ?  prednisoLONE acetate (PRED FORTE) 1 % ophthalmic suspension, Place 1 drop into the left eye in the morning, at noon, in the evening, and at bedtime., Disp: , Rfl:  ?  ranolazine (RANEXA) 500 MG 12 hr tablet, Take 1 tablet (500 mg total) by mouth 2 (two) times daily. (Patient taking differently: Take 500 mg by mouth  daily.), Disp: 60 tablet, Rfl: 2 ?  SPIRIVA RESPIMAT 2.5 MCG/ACT AERS, Inhale 2 puffs into the lungs daily.  (Patient not taking: Reported on 04/01/2021), Disp: , Rfl:  ?  zolpidem (AMBIEN) 10 MG tablet, Take 10 mg by mouth at bedtime. , Disp: , Rfl:  ? ?Allergies  ?Allergen Reactions  ? Nitroglycerin Nausea And Vomiting  ?  SWEATING  ? Isosorbide Dinitrate Nausea And Vomiting  ? ? ? ? ?   ?Objective:  ?Physical Exam  ?General: AAO x3, NAD ? ?Dermatological: Nails with a hypertrophic, dystrophic with yellow, brown discoloration.  No hyperpigmentation at this time.  Ingrowing of the left hallux nail without any signs of infection.  No edema, erythema to the toenail sites.  There is dry, peeling skin present.  No open lesions. ? ?Vascular: Dorsalis Pedis artery and Posterior Tibial artery pedal pulses are 2/4 bilateral with immedate capillary fill time. There is no pain with calf compression, swelling, warmth, erythema.  ? ?Neruologic: Grossly intact via light touch bilateral.  ? ?Musculoskeletal: Muscular strength 5/5 in all groups tested bilateral. ? ?Gait: Unassisted, Nonantalgic.  ? ? ?   ?Assessment:  ? ?61 year old male with onychomycosis, ingrown toenails ? ?   ?Plan:  ?-Treatment options discussed including all alternatives, risks, and complications ?-Etiology of symptoms were discussed ?-As a courtesy debrided the nails without any complications or bleeding.  Given the fungus we discussed different options including oral, topical as well as alternative treatments.  He was proceed with oral Lamisil.  Check a CBC and LFT prior to starting the medication. ?-I also order itriamcinolone cream for the skin. ?-He may consider partial nail avulsion for the ingrown toenail.  ? ?Vivi Barrack DPM ? ?   ? ?

## 2021-08-15 ENCOUNTER — Ambulatory Visit: Payer: Medicare HMO | Admitting: Podiatry

## 2021-08-15 DIAGNOSIS — B351 Tinea unguium: Secondary | ICD-10-CM

## 2021-08-15 DIAGNOSIS — Z79899 Other long term (current) drug therapy: Secondary | ICD-10-CM

## 2021-08-15 NOTE — Patient Instructions (Signed)
Terbinafine Tablets What is this medication? TERBINAFINE (TER bin a feen) treats fungal infections of the nails. It belongs to a group of medications called antifungals. It will not treat infections caused by bacteria or viruses. This medicine may be used for other purposes; ask your health care provider or pharmacist if you have questions. COMMON BRAND NAME(S): Lamisil, Terbinex What should I tell my care team before I take this medication? They need to know if you have any of these conditions: Liver disease An unusual or allergic reaction to terbinafine, other medications, foods, dyes, or preservatives Pregnant or trying to get pregnant Breast-feeding How should I use this medication? Take this medication by mouth with water. Take it as directed on the prescription label at the same time every day. You can take it with or without food. If it upsets your stomach, take it with food. Keep taking it unless your care team tells you to stop. A special MedGuide will be given to you by the pharmacist with each prescription and refill. Be sure to read this information carefully each time. Talk to your care team regarding the use of this medication in children. Special care may be needed. Overdosage: If you think you have taken too much of this medicine contact a poison control center or emergency room at once. NOTE: This medicine is only for you. Do not share this medicine with others. What if I miss a dose? If you miss a dose, take it as soon as you can unless it is more than 4 hours late. If it is more than 4 hours late, skip the missed dose. Take the next dose at the normal time. What may interact with this medication? Do not take this medication with any of the following: Pimozide Thioridazine This medication may also interact with the following: Beta blockers Caffeine Certain medications for mental health conditions Cimetidine Cyclosporine Medications for fungal infections like fluconazole  and ketoconazole Medications for irregular heartbeat like amiodarone, flecainide and propafenone Rifampin Warfarin This list may not describe all possible interactions. Give your health care provider a list of all the medicines, herbs, non-prescription drugs, or dietary supplements you use. Also tell them if you smoke, drink alcohol, or use illegal drugs. Some items may interact with your medicine. What should I watch for while using this medication? Visit your care team for regular checks on your progress. You may need blood work while you are taking this medication. It may be some time before you see the benefit from this medication. This medication may cause serious skin reactions. They can happen weeks to months after starting the medication. Contact your care team right away if you notice fevers or flu-like symptoms with a rash. The rash may be red or purple and then turn into blisters or peeling of the skin. Or, you might notice a red rash with swelling of the face, lips or lymph nodes in your neck or under your arms. This medication can make you more sensitive to the sun. Keep out of the sun, If you cannot avoid being in the sun, wear protective clothing and sunscreen. Do not use sun lamps or tanning beds/booths. What side effects may I notice from receiving this medication? Side effects that you should report to your care team as soon as possible: Allergic reactions--skin rash, itching, hives, swelling of the face, lips, tongue, or throat Change in sense of smell Change in taste Infection--fever, chills, cough, or sore throat Liver injury--right upper belly pain, loss of appetite, nausea,   light-colored stool, dark yellow or brown urine, yellowing skin or eyes, unusual weakness or fatigue Low red blood cell level--unusual weakness or fatigue, dizziness, headache, trouble breathing Lupus-like syndrome--joint pain, swelling, or stiffness, butterfly-shaped rash on the face, rashes that get worse  in the sun, fever, unusual weakness or fatigue Rash, fever, and swollen lymph nodes Redness, blistering, peeling, or loosening of the skin, including inside the mouth Unusual bruising or bleeding Worsening mood, feelings of depression Side effects that usually do not require medical attention (report to your care team if they continue or are bothersome): Diarrhea Gas Headache Nausea Stomach pain Upset stomach This list may not describe all possible side effects. Call your doctor for medical advice about side effects. You may report side effects to FDA at 1-800-FDA-1088. Where should I keep my medication? Keep out of the reach of children and pets. Store between 20 and 25 degrees C (68 and 77 degrees F). Protect from light. Get rid of any unused medication after the expiration date. To get rid of medications that are no longer needed or have expired: Take the medication to a medication take-back program. Check with your pharmacy or law enforcement to find a location. If you cannot return the medication, check the label or package insert to see if the medication should be thrown out in the garbage or flushed down the toilet. If you are not sure, ask your care team. If it is safe to put it in the trash, take the medication out of the container. Mix the medication with cat litter, dirt, coffee grounds, or other unwanted substance. Seal the mixture in a bag or container. Put it in the trash. NOTE: This sheet is a summary. It may not cover all possible information. If you have questions about this medicine, talk to your doctor, pharmacist, or health care provider.  2023 Elsevier/Gold Standard (2020-07-20 00:00:00)  

## 2021-08-16 LAB — CBC WITH DIFFERENTIAL/PLATELET
Basophils Absolute: 0 10*3/uL (ref 0.0–0.2)
Basos: 0 %
EOS (ABSOLUTE): 0.3 10*3/uL (ref 0.0–0.4)
Eos: 2 %
Hematocrit: 44.3 % (ref 37.5–51.0)
Hemoglobin: 15.3 g/dL (ref 13.0–17.7)
Immature Grans (Abs): 0.1 10*3/uL (ref 0.0–0.1)
Immature Granulocytes: 1 %
Lymphocytes Absolute: 2.7 10*3/uL (ref 0.7–3.1)
Lymphs: 20 %
MCH: 31.3 pg (ref 26.6–33.0)
MCHC: 34.5 g/dL (ref 31.5–35.7)
MCV: 91 fL (ref 79–97)
Monocytes Absolute: 1 10*3/uL — ABNORMAL HIGH (ref 0.1–0.9)
Monocytes: 7 %
Neutrophils Absolute: 9.5 10*3/uL — ABNORMAL HIGH (ref 1.4–7.0)
Neutrophils: 70 %
Platelets: 330 10*3/uL (ref 150–450)
RBC: 4.89 x10E6/uL (ref 4.14–5.80)
RDW: 12.6 % (ref 11.6–15.4)
WBC: 13.6 10*3/uL — ABNORMAL HIGH (ref 3.4–10.8)

## 2021-08-16 LAB — HEPATIC FUNCTION PANEL
ALT: 49 IU/L — ABNORMAL HIGH (ref 0–44)
AST: 14 IU/L (ref 0–40)
Albumin: 4.4 g/dL (ref 3.9–4.9)
Alkaline Phosphatase: 80 IU/L (ref 44–121)
Bilirubin Total: 0.5 mg/dL (ref 0.0–1.2)
Bilirubin, Direct: 0.12 mg/dL (ref 0.00–0.40)
Total Protein: 6.9 g/dL (ref 6.0–8.5)

## 2021-08-18 DIAGNOSIS — B351 Tinea unguium: Secondary | ICD-10-CM | POA: Insufficient documentation

## 2021-08-18 NOTE — Progress Notes (Signed)
Subjective: 61 year old male presents the office today for follow-up evaluation of nail fungus.  He states he did not get the blood work done he has not been on any medications today for his symptoms are unchanged.  He has no new concerns today.  No drainage or pus.  No open lesions.   Objective: AAO x3, NAD DP/PT pulses palpable bilaterally, CRT less than 3 seconds Nails are hypertrophic, dystrophic yellow discoloration but there is no edema, erythema to the nail sites.  They do cause discomfort at times however no pain today.  No open lesions. No pain with calf compression, swelling, warmth, erythema  Assessment: Onychomycosis  Plan: -All treatment options discussed with the patient including all alternatives, risks, complications.  -As a courtesy debride the nails with any complications or bleeding.  Reprint of the blood work for CBC and LFT.  Once I receive this and if normal will start Lamisil. -Patient encouraged to call the office with any questions, concerns, change in symptoms.   Vivi Barrack DPM

## 2021-08-19 ENCOUNTER — Encounter: Payer: Self-pay | Admitting: Podiatry

## 2021-08-19 ENCOUNTER — Other Ambulatory Visit: Payer: Self-pay | Admitting: Podiatry

## 2021-08-19 MED ORDER — CICLOPIROX 8 % EX SOLN: Freq: Every day | CUTANEOUS | 2 refills | Status: DC

## 2021-09-28 ENCOUNTER — Other Ambulatory Visit: Payer: Self-pay | Admitting: Family

## 2021-09-28 DIAGNOSIS — M542 Cervicalgia: Secondary | ICD-10-CM

## 2021-10-14 ENCOUNTER — Other Ambulatory Visit: Payer: Medicare HMO

## 2021-10-20 ENCOUNTER — Ambulatory Visit
Admission: RE | Admit: 2021-10-20 | Discharge: 2021-10-20 | Disposition: A | Discharge status: Home / Self Care | Payer: Medicare HMO | Source: Ambulatory Visit | Attending: Family | Admitting: Family

## 2021-10-20 DIAGNOSIS — M542 Cervicalgia: Secondary | ICD-10-CM

## 2021-10-25 ENCOUNTER — Other Ambulatory Visit: Payer: Self-pay

## 2021-10-25 DIAGNOSIS — Z87891 Personal history of nicotine dependence: Secondary | ICD-10-CM

## 2021-10-25 DIAGNOSIS — F17210 Nicotine dependence, cigarettes, uncomplicated: Secondary | ICD-10-CM

## 2021-10-25 DIAGNOSIS — Z122 Encounter for screening for malignant neoplasm of respiratory organs: Secondary | ICD-10-CM

## 2021-11-29 ENCOUNTER — Ambulatory Visit (INDEPENDENT_AMBULATORY_CARE_PROVIDER_SITE_OTHER): Payer: Medicare HMO | Admitting: Acute Care

## 2021-11-29 ENCOUNTER — Encounter: Payer: Self-pay | Admitting: Acute Care

## 2021-11-29 DIAGNOSIS — F17210 Nicotine dependence, cigarettes, uncomplicated: Secondary | ICD-10-CM

## 2021-11-29 NOTE — Patient Instructions (Signed)
Thank you for participating in the [facility]. It was our pleasure to meet you today. We will call you with the results of your scan within the next few days. Your scan will be assigned a Lung RADS category score by the physicians reading the scans.  This Lung RADS score determines follow up scanning.  See below for description of categories, and follow up screening recommendations. We will be in touch to schedule your follow up screening annually or based on recommendations of our providers. We will fax a copy of your scan results to your Primary Care Physician, or the physician who referred you to the program, to ensure they have the results. Please call the office if you have any questions or concerns regarding your scanning experience or results.  Our office number is [phone]. Please speak with Denise Phelps, RN. , or  Denise Buckner RN, They are  our Lung Cancer Screening RN.'s If They are unavailable when you call, Please leave a message on the voice mail. We will return your call at our earliest convenience.This voice mail is monitored several times a day.  Remember, if your scan is normal, we will scan you annually as long as you continue to meet the criteria for the program. (Age 55-77, Current smoker or smoker who has quit within the last 15 years). If you are a smoker, remember, quitting is the single most powerful action that you can take to decrease your risk of lung cancer and other pulmonary, breathing related problems. We know quitting is hard, and we are here to help.  Please let us know if there is anything we can do to help you meet your goal of quitting. If you are a former smoker, congratulations. We are proud of you! Remain smoke free! Remember you can refer friends or family members through the number above.  We will screen them to make sure they meet criteria for the program. Thank you for helping us take better care of you by  participating in Lung Screening.  You can receive free nicotine replacement therapy ( patches, gum or mints) by calling 1-800-QUIT NOW. Please call so we can get you on the path to becoming  a non-smoker. I know it is hard, but you can do this!  Lung RADS Categories:  Lung RADS 1: no nodules or definitely non-concerning nodules.  Recommendation is for a repeat annual scan in 12 months.  Lung RADS 2:  nodules that are non-concerning in appearance and behavior with a very low likelihood of becoming an active cancer. Recommendation is for a repeat annual scan in 12 months.  Lung RADS 3: nodules that are probably non-concerning , includes nodules with a low likelihood of becoming an active cancer.  Recommendation is for a 6-month repeat screening scan. Often noted after an upper respiratory illness. We will be in touch to make sure you have no questions, and to schedule your 6-month scan.  Lung RADS 4 A: nodules with concerning findings, recommendation is most often for a follow up scan in 3 months or additional testing based on our provider's assessment of the scan. We will be in touch to make sure you have no questions and to schedule the recommended 3 month follow up scan.  Lung RADS 4 B:  indicates findings that are concerning. We will be in touch with you to schedule additional diagnostic testing based on our provider's  assessment of the scan.  Other options for assistance in smoking cessation (   As covered by your insurance benefits)  Hypnosis for smoking cessation  [employer] [phone]  Acupuncture for smoking cessation  [facility] [phone]   

## 2021-11-29 NOTE — Progress Notes (Signed)
Virtual Visit via Telephone Note  I connected with Troy French on 11/29/21 at  3:30 PM EST by telephone and verified that I am speaking with the correct person using two identifiers.  Location: Patient:  At home Provider: [address], [place], [place], [address]    I discussed the limitations, risks, security and privacy concerns of performing an evaluation and management service by telephone and the availability of in person appointments. I also discussed with the patient that there may be a patient responsible charge related to this service. The patient expressed understanding and agreed to proceed.   Shared Decision Making Visit Lung Cancer Screening Program [phone])   Eligibility: Age 61 y.o. Pack Years Smoking History Calculation 52 pack year smoking history (# packs/per year x # years smoked) Recent History of coughing up blood  no Unexplained weight loss? no ( >Than 15 pounds within the last 6 months ) Prior History Lung / other cancer no (Diagnosis within the last 5 years already requiring surveillance chest CT Scans). Smoking Status Current Smoker Former Smokers: Years since quit:  NA  Quit Date:  NA  Visit Components: Discussion included one or more decision making aids. yes Discussion included risk/benefits of screening. yes Discussion included potential follow up diagnostic testing for abnormal scans. yes Discussion included meaning and risk of over diagnosis. yes Discussion included meaning and risk of False Positives. yes Discussion included meaning of total radiation exposure. yes  Counseling Included: Importance of adherence to annual lung cancer LDCT screening. yes Impact of comorbidities on ability to participate in the program. yes Ability and willingness to under diagnostic treatment. yes  Smoking Cessation Counseling: Current Smokers:  Discussed importance of smoking cessation. yes Information about tobacco cessation classes and interventions  provided to patient. yes Patient provided with "ticket" for LDCT Scan. yes Symptomatic Patient. no  Counseling NA Diagnosis Code: Tobacco Use Z72.0 Asymptomatic Patient yes  Counseling (Intermediate counseling: > three minutes counseling) J0300 Former Smokers:  Discussed the importance of maintaining cigarette abstinence. yes Diagnosis Code: Personal History of Nicotine Dependence. P23.300 Information about tobacco cessation classes and interventions provided to patient. Yes Patient provided with "ticket" for LDCT Scan. yes Written Order for Lung Cancer Screening with LDCT placed in Epic. Yes (CT Chest Lung Cancer Screening Low Dose W/O CM) [id number] Z12.2-Screening of respiratory organs Z87.891-Personal history of nicotine dependence  I have spent 25 minutes of face to face/ virtual visit   time with  Troy French discussing the risks and benefits of lung cancer screening. We viewed / discussed a power point together that explained in detail the above noted topics. We paused at intervals to allow for questions to be asked and answered to ensure understanding.We discussed that the single most powerful action that he can take to decrease his risk of developing lung cancer is to quit smoking. We discussed whether or not he is ready to commit to setting a quit date. We discussed options for tools to aid in quitting smoking including nicotine replacement therapy, non-nicotine medications, support groups, Quit Smart classes, and behavior modification. We discussed that often times setting smaller, more achievable goals, such as eliminating 1 cigarette a day for a week and then 2 cigarettes a day for a week can be helpful in slowly decreasing the number of cigarettes smoked. This allows for a sense of accomplishment as well as providing a clinical benefit. I provided  him  with smoking cessation  information  with contact information for community resources, classes, free  nicotine replacement therapy, and  access to mobile apps, text messaging, and on-line smoking cessation help. I have also provided  him  the office contact information in the event he needs to contact me, or the screening staff. We discussed the time and location of the scan, and that either Abigail Miyamoto RN, Karlton Lemon, RN  or I will call / send a letter with the results within 24-72 hours of receiving them. The patient verbalized understanding of all of  the above and had no further questions upon leaving the office. They have my contact information in the event they have any further questions.  I spent 3 minutes counseling on smoking cessation and the health risks of continued tobacco abuse.  I explained to the patient that there has been a high incidence of coronary artery disease noted on these exams. I explained that this is a non-gated exam therefore degree or severity cannot be determined. This patient is  on statin therapy. I have asked the patient to follow-up with their PCP regarding any incidental finding of coronary artery disease and management with diet or medication as their PCP  feels is clinically indicated. The patient verbalized understanding of the above and had no further questions upon completion of the visit.      Bevelyn Ngo, NP 11/29/2021

## 2021-11-30 ENCOUNTER — Ambulatory Visit
Admission: RE | Admit: 2021-11-30 | Discharge: 2021-11-30 | Disposition: A | Discharge status: Home / Self Care | Payer: Medicare HMO | Source: Ambulatory Visit | Attending: Acute Care | Admitting: Acute Care

## 2021-11-30 DIAGNOSIS — Z122 Encounter for screening for malignant neoplasm of respiratory organs: Secondary | ICD-10-CM

## 2021-11-30 DIAGNOSIS — Z87891 Personal history of nicotine dependence: Secondary | ICD-10-CM

## 2021-11-30 DIAGNOSIS — F17210 Nicotine dependence, cigarettes, uncomplicated: Secondary | ICD-10-CM

## 2021-12-06 ENCOUNTER — Other Ambulatory Visit: Payer: Self-pay

## 2021-12-06 DIAGNOSIS — Z122 Encounter for screening for malignant neoplasm of respiratory organs: Secondary | ICD-10-CM

## 2021-12-06 DIAGNOSIS — Z87891 Personal history of nicotine dependence: Secondary | ICD-10-CM

## 2021-12-06 DIAGNOSIS — F17210 Nicotine dependence, cigarettes, uncomplicated: Secondary | ICD-10-CM

## 2022-08-23 NOTE — Telephone Encounter (Signed)
No action done

## 2022-12-04 ENCOUNTER — Inpatient Hospital Stay: Admission: RE | Admit: 2022-12-04 | Payer: Medicare HMO | Source: Ambulatory Visit

## 2023-03-10 ENCOUNTER — Other Ambulatory Visit (HOSPITAL_BASED_OUTPATIENT_CLINIC_OR_DEPARTMENT_OTHER): Payer: Self-pay

## 2023-03-10 ENCOUNTER — Emergency Department (HOSPITAL_BASED_OUTPATIENT_CLINIC_OR_DEPARTMENT_OTHER)
Admission: EM | Admit: 2023-03-10 | Discharge: 2023-03-10 | Disposition: A | Discharge status: Home / Self Care | Attending: Emergency Medicine | Admitting: Emergency Medicine

## 2023-03-10 ENCOUNTER — Encounter (HOSPITAL_BASED_OUTPATIENT_CLINIC_OR_DEPARTMENT_OTHER): Payer: Self-pay | Admitting: Emergency Medicine

## 2023-03-10 DIAGNOSIS — Z7902 Long term (current) use of antithrombotics/antiplatelets: Secondary | ICD-10-CM | POA: Diagnosis not present

## 2023-03-10 DIAGNOSIS — K0889 Other specified disorders of teeth and supporting structures: Secondary | ICD-10-CM | POA: Insufficient documentation

## 2023-03-10 DIAGNOSIS — R220 Localized swelling, mass and lump, head: Secondary | ICD-10-CM | POA: Diagnosis present

## 2023-03-10 MED ORDER — CLINDAMYCIN HCL 300 MG PO CAPS
300.0000 mg | ORAL_CAPSULE | Freq: Three times a day (TID) | ORAL | 0 refills | Status: AC
  Filled 2023-03-10: qty 21, 7d supply, fill #0

## 2023-03-10 NOTE — ED Triage Notes (Signed)
 Pt c/o RT side facial pain and swelling. No s/s of distress. EDP [place] at bedside. Current tx with abx for same

## 2023-03-10 NOTE — ED Provider Notes (Signed)
  EMERGENCY DEPARTMENT AT [facility] Provider Note   CSN: [id number] Arrival date & time: 03/10/23  1038     History  Chief Complaint  Patient presents with   Facial Swelling    Troy French is a 63 y.o. male.  Patient here with dental pain and swelling to the right mandible.  He has been on amoxicillin for 2 days without much change.  Nothing makes it worse or better.  Denies any difficulty opening his mouth.  Some pain when he swallows.  Denies any weakness numbness tingling.  Denies any chest pain.  Saw dentistry today but they want to get swelling more time to go down before doing anything to the tooth  The history is provided by the patient.       Home Medications Prior to Admission medications   Medication Sig Start Date End Date Taking? Authorizing Provider  albuterol (VENTOLIN HFA) 108 (90 Base) MCG/ACT inhaler Inhale 2 puffs into the lungs every 6 (six) hours as needed for wheezing or shortness of breath.    [provider]  atorvastatin (LIPITOR) 40 MG tablet Take 1 tablet (40 mg total) by mouth daily. 11/29/20   Patwardhan, Anabel Bene, MD  ciclopirox (PENLAC) 8 % solution Apply topically at bedtime. Apply over nail and surrounding skin. Apply daily over previous coat. After seven (7) days, may remove with alcohol and continue cycle. 08/19/21   Vivi Barrack, DPM  clindamycin (CLEOCIN) 300 MG capsule Take 1 capsule (300 mg total) by mouth 3 (three) times daily for 7 days. 03/10/23 03/17/23 Yes Catelin Manthe, DO  clopidogrel (PLAVIX) 75 MG tablet Take 1 tablet (75 mg total) by mouth daily. 04/28/20   Patwardhan, Anabel Bene, MD  cyclobenzaprine (FLEXERIL) 10 MG tablet Take 10 mg by mouth 3 (three) times daily. 03/11/21   [provider]  diazepam (VALIUM) 2 MG tablet Take 2 mg by mouth every 6 (six) hours as needed for anxiety.    [provider]  diltiazem (DILACOR XR) 240 MG 24 hr capsule Take 1 capsule (240 mg total) by mouth daily.  11/29/20   Patwardhan, Anabel Bene, MD  famotidine (PEPCID) 20 MG tablet Take 20 mg by mouth 2 (two) times daily. 12/30/20   [provider]  Fluticasone-Umeclidin-Vilant (TRELEGY ELLIPTA) 100-62.5-25 MCG/ACT AEPB Inhale 1 puff into the lungs daily. 12/06/20   [provider]  lisinopril (ZESTRIL) 10 MG tablet Take 1 tablet (10 mg total) by mouth daily. 02/26/19   Runell Gess, MD  loperamide (IMODIUM) 2 MG capsule Take 1 capsule (2 mg total) by mouth 4 (four) times daily as needed for diarrhea or loose stools. Patient not taking: Reported on 04/01/2021 10/21/19   Benjiman Core, MD  metoprolol succinate (TOPROL-XL) 50 MG 24 hr tablet Take 50 mg by mouth daily. Take with or immediately following a meal.    [provider]  morphine (MS CONTIN) 30 MG 12 hr tablet Take 30 mg by mouth daily.    [provider]  oxyCODONE-acetaminophen (PERCOCET) 10-325 MG tablet Take 1 tablet by mouth 3 (three) times daily as needed.    [provider]  pantoprazole (PROTONIX) 40 MG tablet Take 1 tablet (40 mg total) by mouth daily. 02/11/19   Arty Baumgartner, NP  prednisoLONE acetate (PRED FORTE) 1 % ophthalmic suspension Place 1 drop into the left eye in the morning, at noon, in the evening, and at bedtime. 02/02/21   [provider]  ranolazine (RANEXA) 500 MG  12 hr tablet Take 1 tablet (500 mg total) by mouth 2 (two) times daily. Patient taking differently: Take 500 mg by mouth daily. 11/29/20   Patwardhan, Anabel Bene, MD  SPIRIVA RESPIMAT 2.5 MCG/ACT AERS Inhale 2 puffs into the lungs daily.  Patient not taking: Reported on 04/01/2021 01/13/19   [provider]  triamcinolone (KENALOG) 0.025 % ointment Apply 1 application. topically 2 (two) times daily. 05/16/21   Vivi Barrack, DPM  zolpidem (AMBIEN) 10 MG tablet Take 10 mg by mouth at bedtime.  01/27/19   [provider]      Allergies    Nitroglycerin and Isosorbide dinitrate    Review  of Systems   Review of Systems  Physical Exam Updated Vital Signs BP 106/75   Pulse 90   Temp 98.4 F (36.9 C)   Resp 18   SpO2 92%  Physical Exam Vitals and nursing note reviewed.  Constitutional:      General: He is not in acute distress.    Appearance: He is well-developed. He is not ill-appearing.  HENT:     Head: Normocephalic and atraumatic.     Nose: Nose normal.     Mouth/Throat:     Mouth: Mucous membranes are moist.  Eyes:     Extraocular Movements: Extraocular movements intact.     Conjunctiva/sclera: Conjunctivae normal.     Pupils: Pupils are equal, round, and reactive to light.  Cardiovascular:     Rate and Rhythm: Normal rate and regular rhythm.     Pulses: Normal pulses.     Heart sounds: Normal heart sounds. No murmur heard. Pulmonary:     Effort: Pulmonary effort is normal. No respiratory distress.     Breath sounds: Normal breath sounds.  Abdominal:     General: Abdomen is flat.     Palpations: Abdomen is soft.     Tenderness: There is no abdominal tenderness.  Musculoskeletal:        General: No swelling.     Cervical back: Normal range of motion and neck supple.  Skin:    General: Skin is warm and dry.     Capillary Refill: Capillary refill takes less than 2 seconds.  Neurological:     General: No focal deficit present.     Mental Status: He is alert.  Psychiatric:        Mood and Affect: Mood normal.     ED Results / Procedures / Treatments   Labs (all labs ordered are listed, but only abnormal results are displayed) Labs Reviewed - No data to display  EKG None  Radiology No results found.  Procedures Procedures    Medications Ordered in ED Medications - No data to display  ED Course/ Medical Decision Making/ A&P                                 Medical Decision Making Risk Prescription drug management.   Troy French is here with right lower dental pain with some mild facial swelling around this.  Normal vitals.  No  fever.  He has some poor dental caries in the right lower jaw.  1 area where there is a chipped tooth.  Focal swelling over the mandible area but no trismus or drooling posterior oropharynx is unremarkable.  Overall I do think that this is a small dental abscess.  He has been on amoxicillin for 48 hours I think that he will  continue to improve with this medicine but will clindamycin and refer him to oral surgery/dentistry.  He understands return precautions.  But there is no major submandibular swelling.  No trismus have no concern for Ludwig's other emergent process but I have made him aware of these things and to make sure that he return to the ED if this worsens.  Discharged in good condition.  This chart was dictated using voice recognition software.  Despite best efforts to proofread,  errors can occur which can change the documentation meaning.         Final Clinical Impression(s) / ED Diagnoses Final diagnoses:  Pain, dental  Facial swelling    Rx / DC Orders ED Discharge Orders          Ordered    clindamycin (CLEOCIN) 300 MG capsule  3 times daily        03/10/23 1054              [place], Madelaine Bhat, DO 03/10/23 1056

## 2023-03-10 NOTE — Discharge Instructions (Signed)
 Continue to take antibiotics.  Follow-up with dentistry or oral surgery.  Return if symptoms worsen as we discussed.

## 2023-03-10 NOTE — ED Notes (Signed)

## 2023-06-07 ENCOUNTER — Other Ambulatory Visit: Payer: Self-pay | Admitting: Nurse Practitioner

## 2023-06-07 DIAGNOSIS — J449 Chronic obstructive pulmonary disease, unspecified: Secondary | ICD-10-CM

## 2023-07-04 IMAGING — CR DG CHEST 2V
2 series · 2 of 2 positions shown · non-contrast
Comparison: 10/21/2019

CLINICAL DATA: Chest pain, shortness of breath

EXAM:
CHEST - 2 VIEW

[chest ap]
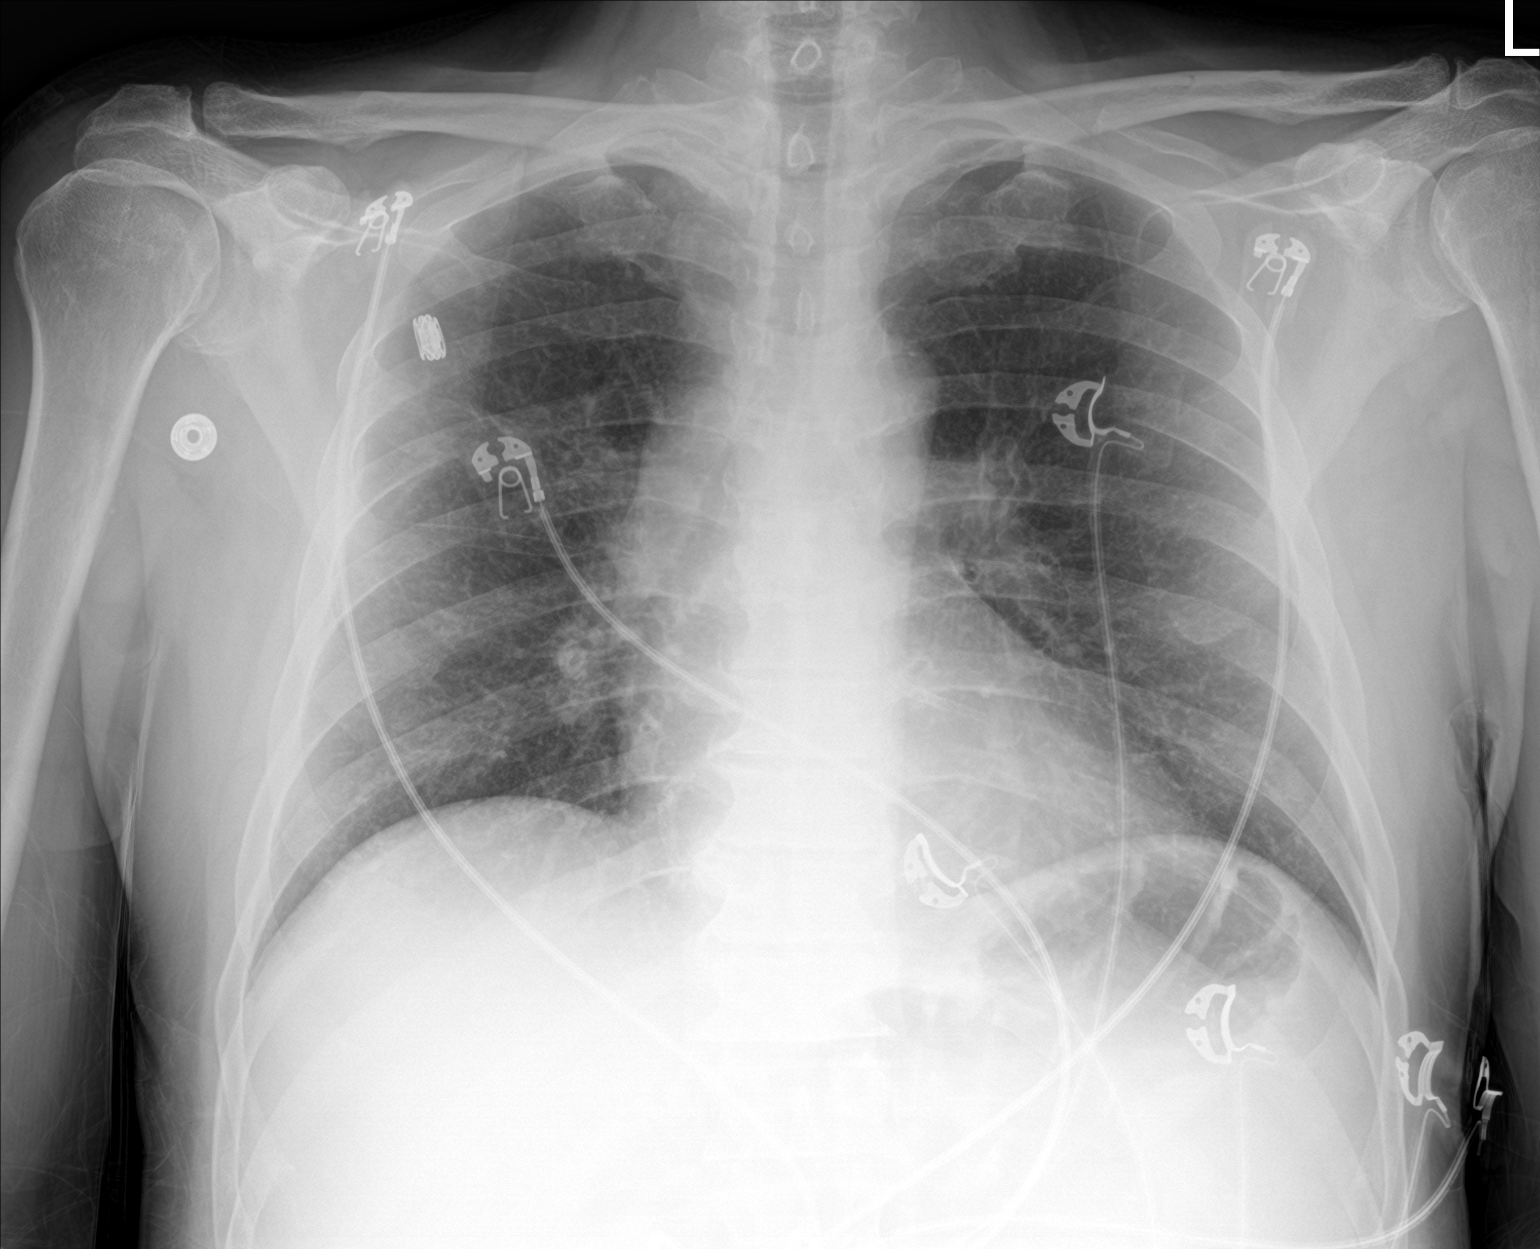

[chest lat]
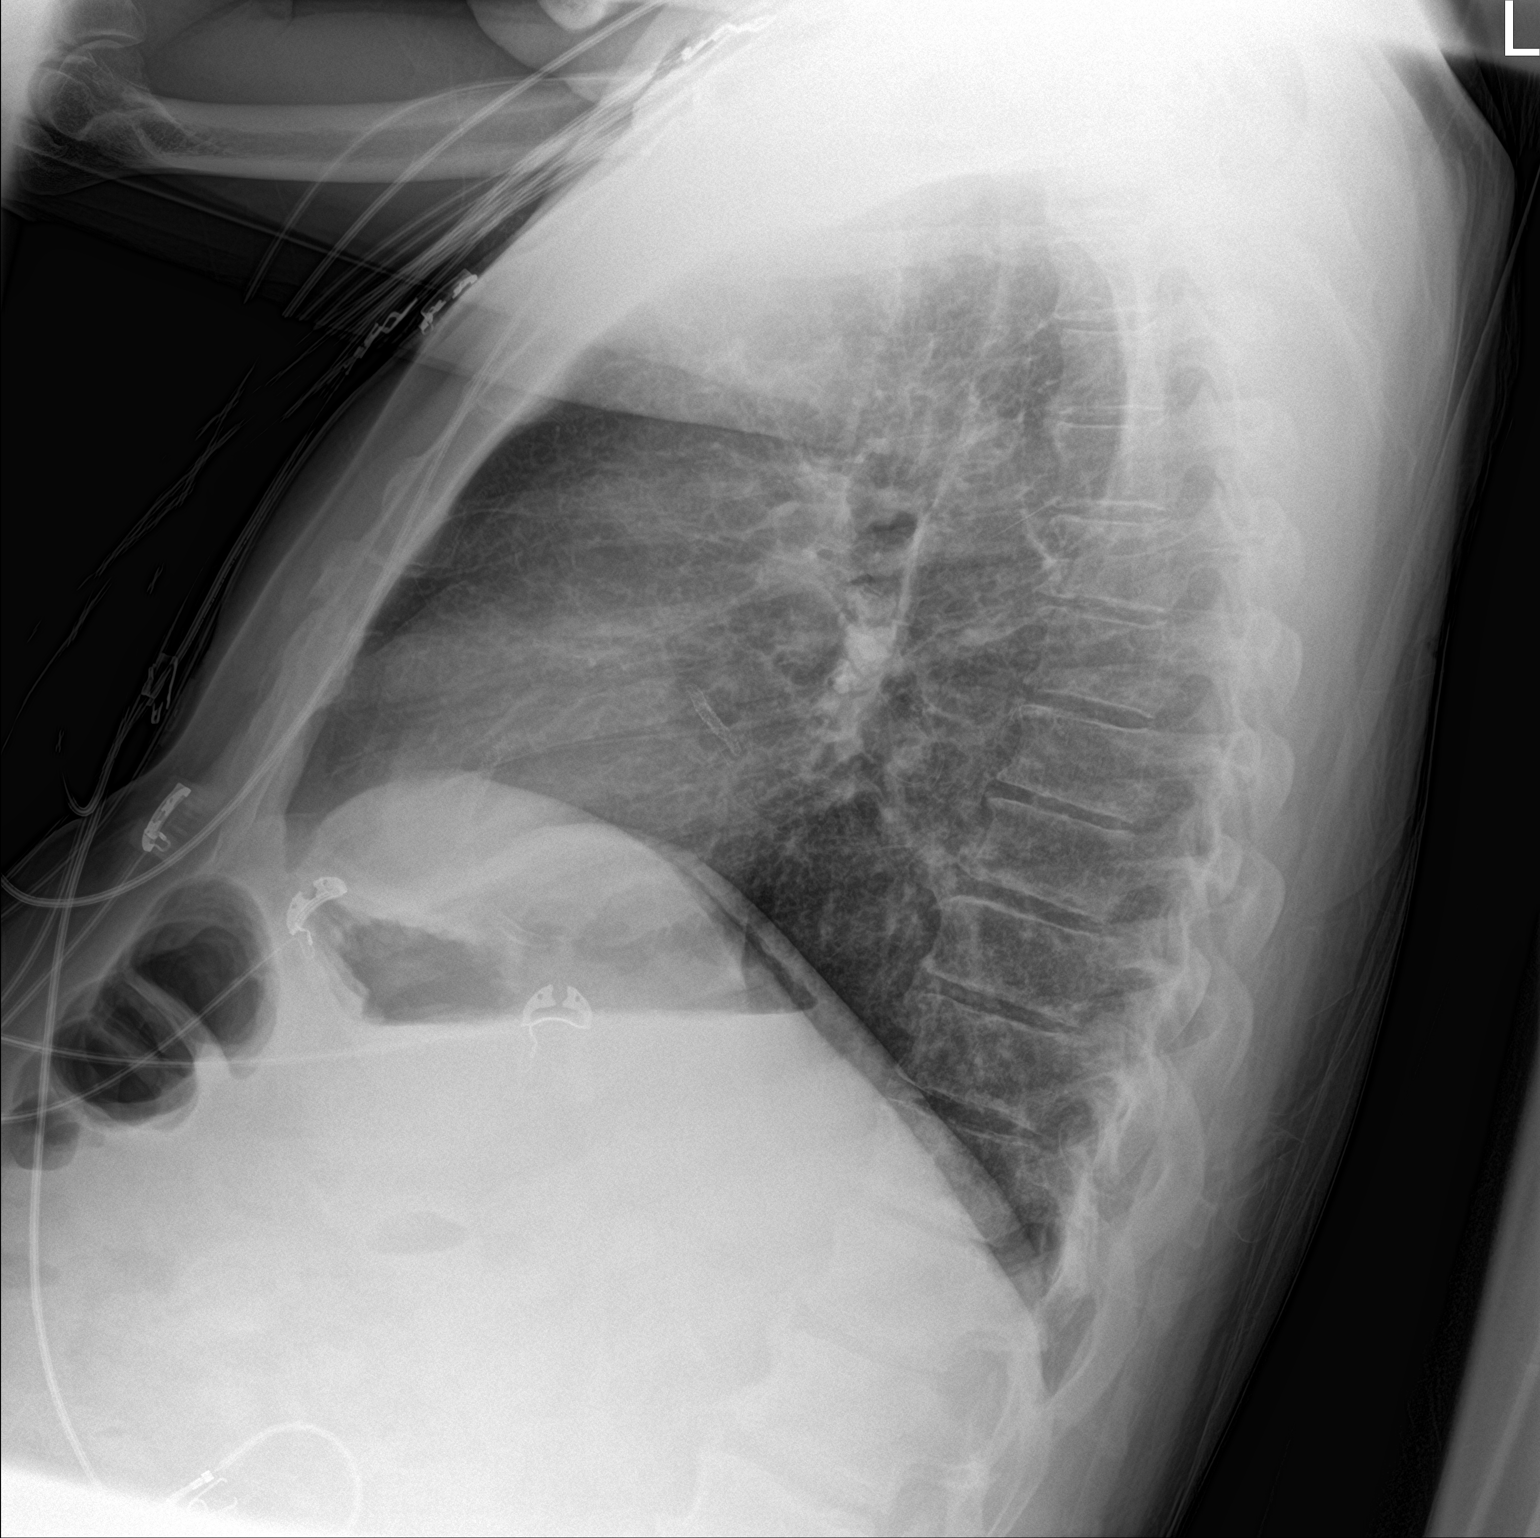

[2 of 2 positions shown; findings below may reference images not displayed]

FINDINGS: The heart size and mediastinal contours are within normal limits.
Prior coronary artery stenting. Mildly coarsened interstitial
markings bilaterally, similar to prior. No focal airspace
consolidation, pleural effusion, or pneumothorax. The visualized
skeletal structures are unremarkable.
IMPRESSION: Chronic bronchitic type lung changes. No focal airspace
consolidation.

## 2023-07-04 IMAGING — CT CT ANGIO CHEST-ABD-PELV FOR DISSECTION W/ AND WO/W CM
2 of 7 series · 13 of 46 positions shown, 15 images · IV contrast (APPLIED)
Comparison: MRI spine November 05, 2019.

CLINICAL DATA: Chest and back pain, aortic dissection suspected.
Epigastric abdominal pain with nausea vomiting diarrhea.

EXAM:
CT ANGIOGRAPHY CHEST, ABDOMEN AND PELVIS
TECHNIQUE: Non-contrast CT of the chest was initially obtained.

[Series 7: arterial · axial · arterial · 0.71mm/px · z∈[+909,+1457]mm · 10 of 320 slices shown, 12 images]
[im 23/320  soft-tissue]
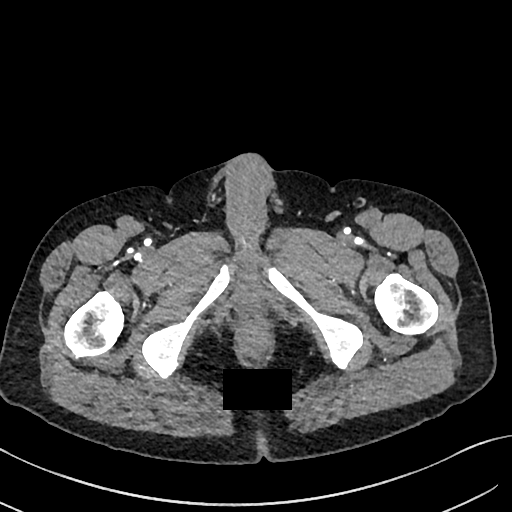
[im 23/320  bone]
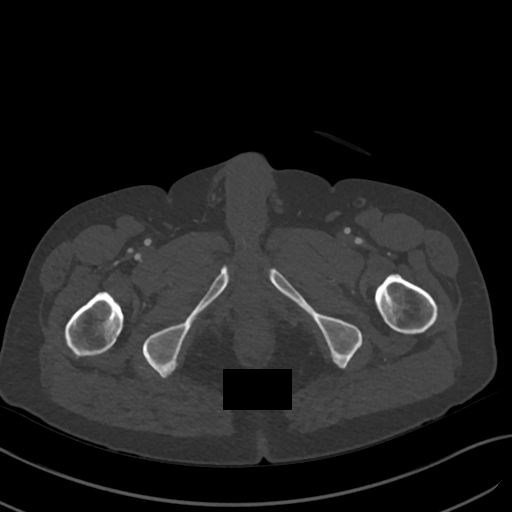
[im 46/320  soft-tissue]
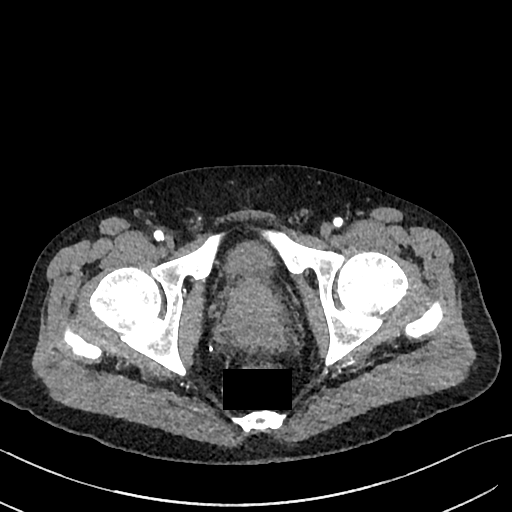
[im 92/320  soft-tissue]
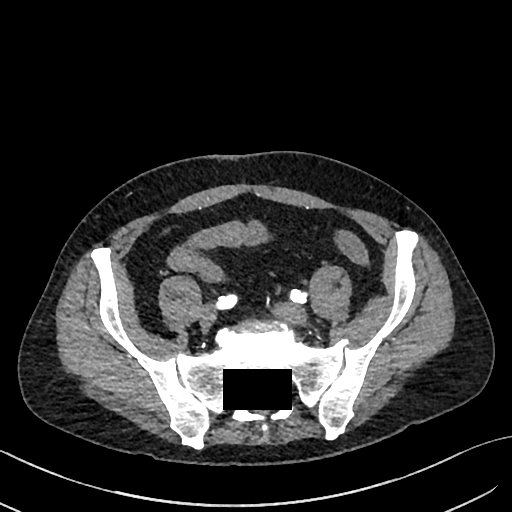
[im 114/320  soft-tissue]
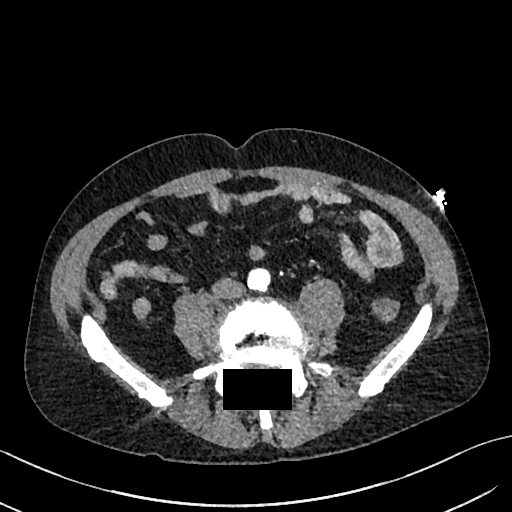
[im 137/320  soft-tissue]
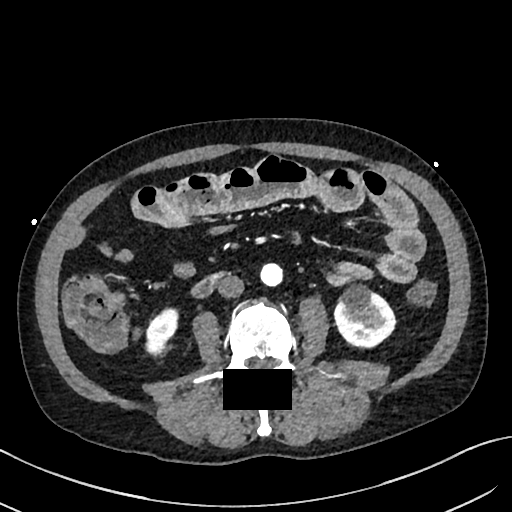
[im 183/320  soft-tissue]
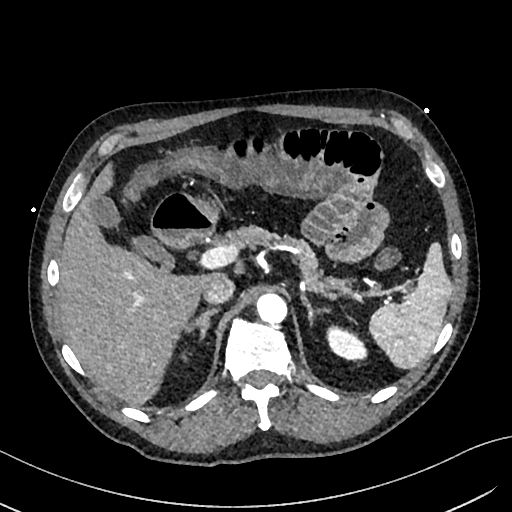
[im 206/320  soft-tissue]
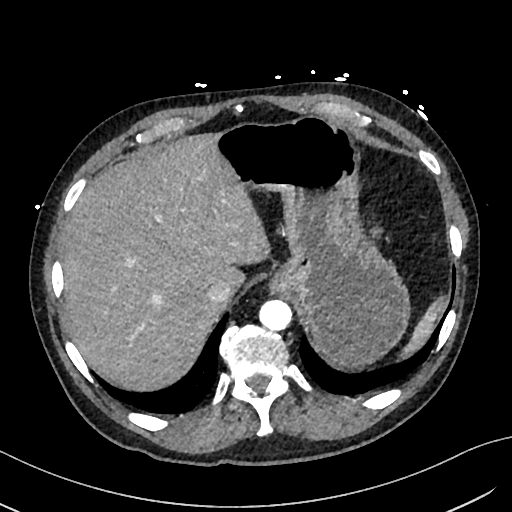
[im 228/320  soft-tissue]
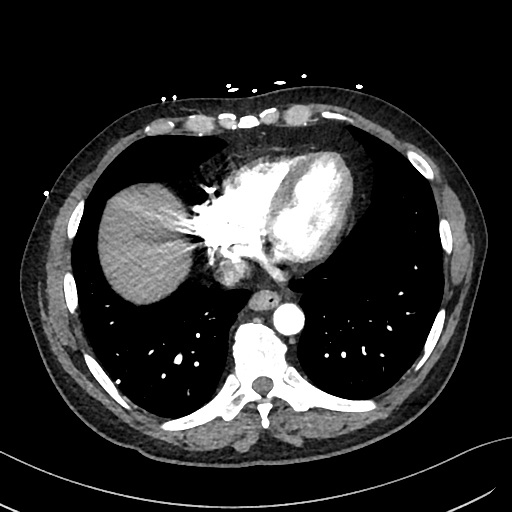
[im 274/320  soft-tissue]
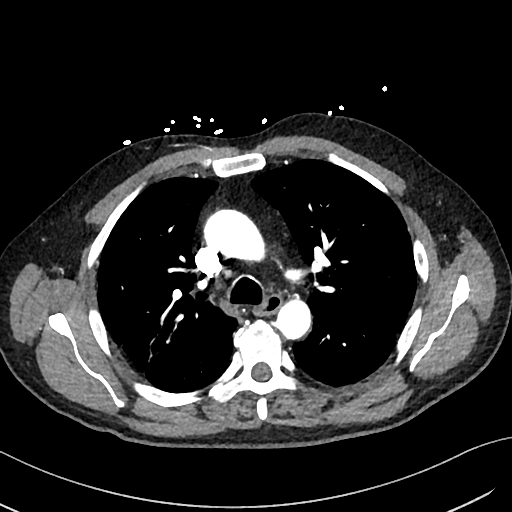
[im 274/320  bone]
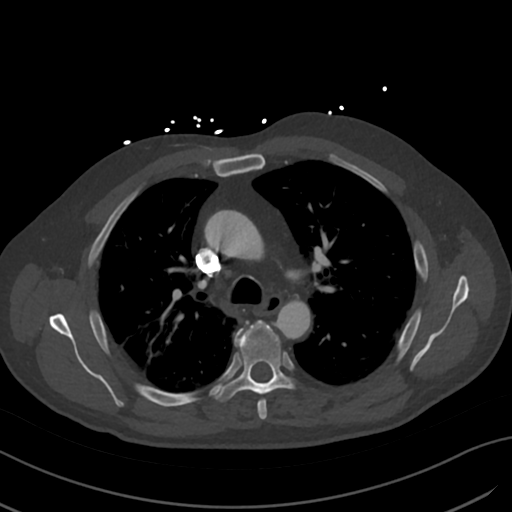
[im 297/320  soft-tissue]
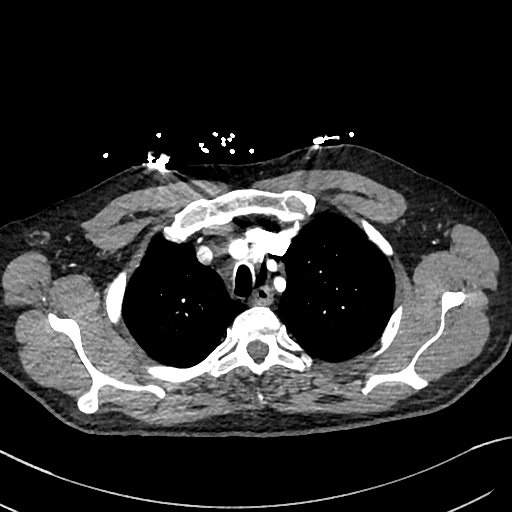

[Series 10: cor · coronal · 0.80mm/px · 3 of 146 slices shown]
[im 37/146  soft-tissue]
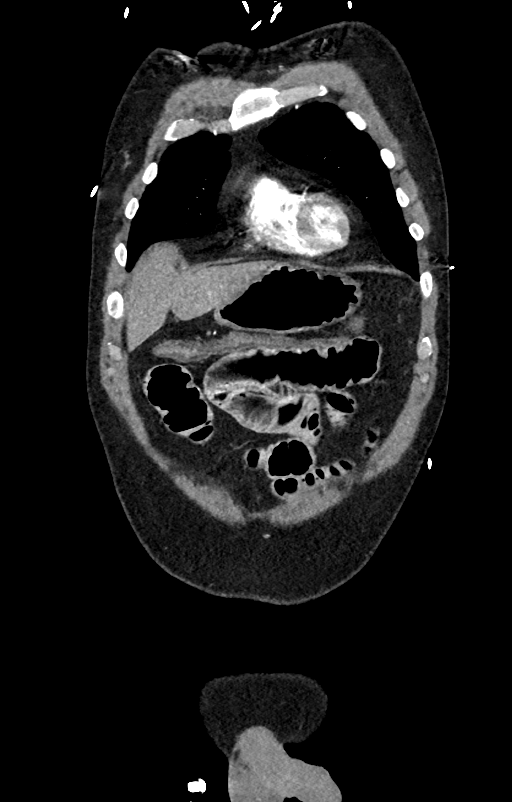
[im 73/146  soft-tissue]
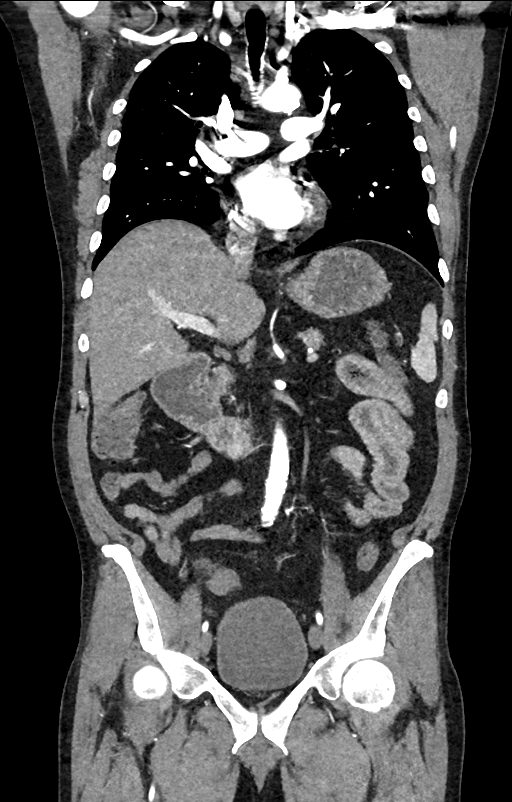
[im 109/146  soft-tissue]
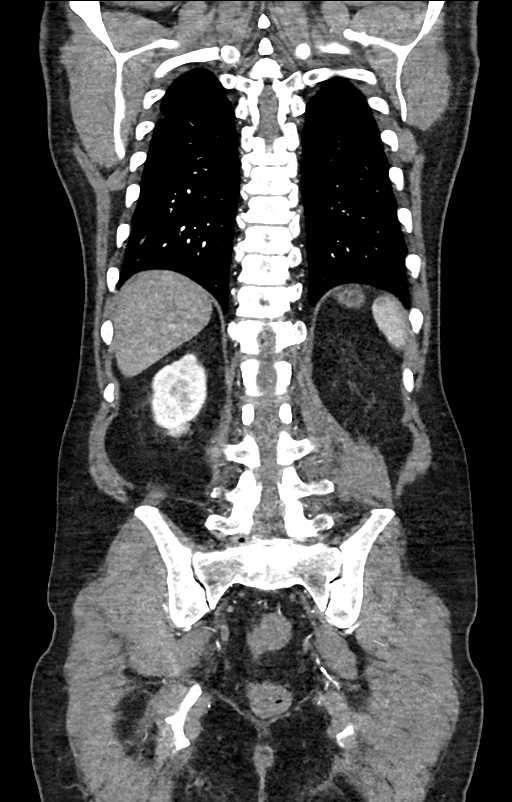

[13 of 46 positions shown; findings below may reference images not displayed]

Multidetector CT imaging through the chest, abdomen and pelvis was
performed using the standard protocol during bolus administration of
intravenous contrast. Multiplanar reconstructed images and MIPs were
obtained and reviewed to evaluate the vascular anatomy.

RADIATION DOSE REDUCTION: This exam was performed according to the
departmental dose-optimization program which includes automated
exposure control, adjustment of the mA and/or kV according to
patient size and/or use of iterative reconstruction technique.

CONTRAST:  100mL OMNIPAQUE IOHEXOL 350 MG/ML SOLN
FINDINGS: CTA CHEST FINDINGS

Cardiovascular: Noncontrast sequence demonstrates aortic
atherosclerosis without intramural hematoma. Preferential
opacification of the thoracic aorta. No evidence of thoracic aortic
aneurysm or dissection. No central pulmonary embolus on this
nondedicated study. Normal heart size. Three-vessel coronary artery
calcifications. No significant pericardial effusion/thickening.

Mediastinum/Nodes: No discrete thyroid nodule. Calcified mediastinal
and right hilar lymph nodes. No pathologically enlarged mediastinal,
hilar or axillary lymph nodes. Mild esophageal wall thickening.

Lungs/Pleura: Diffuse bronchial wall thickening with mosaic
attenuation of the lungs. Partially calcified 6 mm right lower lobe
pulmonary nodule. Scattered areas of subsegmental atelectasis versus
scarring. No pleural effusion. No pneumothorax.

Musculoskeletal: Multilevel degenerative changes spine. No acute
osseous abnormality.

Review of the MIP images confirms the above findings.

CTA ABDOMEN AND PELVIS FINDINGS

VASCULAR

Aorta: Aortic atherosclerosis. Normal caliber aorta without
aneurysm, dissection, vasculitis or significant stenosis.

Celiac: Patent without evidence of aneurysm, dissection, vasculitis
or significant stenosis.

SMA: Patent without evidence of aneurysm, dissection, vasculitis or
significant stenosis.

Renals: Both renal arteries are patent without evidence of aneurysm,
dissection, vasculitis, fibromuscular dysplasia or significant
stenosis.

IMA: Patent without evidence of aneurysm, dissection, vasculitis or
significant stenosis.

Inflow: Patent without evidence of aneurysm, dissection, vasculitis
or significant stenosis.

Veins: No obvious venous abnormality within the limitations of this
arterial phase study.

Review of the MIP images confirms the above findings.

NON-VASCULAR

Hepatobiliary: No suspicious hepatic lesion. Gallbladder is
unremarkable. No biliary ductal dilation.

Pancreas: No pancreatic ductal dilation or evidence of acute
inflammation.

Spleen: No splenomegaly or focal splenic lesion.

Adrenals/Urinary Tract: Bilateral adrenal glands appear normal. No
hydronephrosis. 2.5 cm left lower pole renal cyst. Urinary bladder
is unremarkable for degree of distension.

Stomach/Bowel: No enteric contrast was administered. Stomach is
distended with ingested material and gas without abnormal wall
thickening. Prominent loops fluid and gas-filled small bowel in the
abdomen with some associated wall thickening. No abrupt transition
to decompressed bowel. Distal small bowel as well as the
predominance of the colon is decompressed with a small volume of
stool in the proximal colon. Normal appendix.

Lymphatic: No pathologically enlarged abdominal or pelvic lymph
nodes.

Reproductive: Prostate is unremarkable.

Other: No significant abdominopelvic free fluid.

Musculoskeletal: Multilevel degenerative changes spine. No acute
osseous abnormality.

Review of the MIP images confirms the above findings.
IMPRESSION: 1. No evidence of thoracic or abdominal aortic aneurysm or
dissection.
2. Diffuse bronchial wall thickening with mosaic attenuation of the
lungs, suggestive of small airways disease.
3. Prominent loops of fluid and gas-filled small bowel in the upper
abdomen with some associated wall thickening. No abrupt transition
to decompressed bowel. Findings are suggestive of enteritis.
Although early/partial small bowel obstruction while less likely is
a differential consideration.
4. Mild esophageal wall thickening, which can be seen in the setting
of esophagitis.
5. Sequela of prior granulomatous disease with calcified right lower
lobe pulmonary nodule and calcified mediastinal and right hilar
lymph nodes.
6.  Aortic Atherosclerosis ([id number]).

## 2023-10-03 ENCOUNTER — Emergency Department (HOSPITAL_BASED_OUTPATIENT_CLINIC_OR_DEPARTMENT_OTHER)

## 2023-10-03 ENCOUNTER — Other Ambulatory Visit: Payer: Self-pay

## 2023-10-03 ENCOUNTER — Emergency Department (HOSPITAL_BASED_OUTPATIENT_CLINIC_OR_DEPARTMENT_OTHER)
Admission: EM | Admit: 2023-10-03 | Discharge: 2023-10-03 | Disposition: A | Discharge status: Home / Self Care | Attending: Emergency Medicine | Admitting: Emergency Medicine

## 2023-10-03 ENCOUNTER — Encounter (HOSPITAL_BASED_OUTPATIENT_CLINIC_OR_DEPARTMENT_OTHER): Payer: Self-pay

## 2023-10-03 ENCOUNTER — Emergency Department (HOSPITAL_BASED_OUTPATIENT_CLINIC_OR_DEPARTMENT_OTHER): Admitting: Radiology

## 2023-10-03 DIAGNOSIS — R109 Unspecified abdominal pain: Secondary | ICD-10-CM | POA: Insufficient documentation

## 2023-10-03 DIAGNOSIS — I2510 Atherosclerotic heart disease of native coronary artery without angina pectoris: Secondary | ICD-10-CM | POA: Diagnosis not present

## 2023-10-03 DIAGNOSIS — M545 Low back pain, unspecified: Secondary | ICD-10-CM | POA: Diagnosis present

## 2023-10-03 DIAGNOSIS — F17290 Nicotine dependence, other tobacco product, uncomplicated: Secondary | ICD-10-CM | POA: Diagnosis not present

## 2023-10-03 DIAGNOSIS — J449 Chronic obstructive pulmonary disease, unspecified: Secondary | ICD-10-CM | POA: Insufficient documentation

## 2023-10-03 DIAGNOSIS — Z7951 Long term (current) use of inhaled steroids: Secondary | ICD-10-CM | POA: Diagnosis not present

## 2023-10-03 DIAGNOSIS — I10 Essential (primary) hypertension: Secondary | ICD-10-CM | POA: Insufficient documentation

## 2023-10-03 DIAGNOSIS — Z7902 Long term (current) use of antithrombotics/antiplatelets: Secondary | ICD-10-CM | POA: Insufficient documentation

## 2023-10-03 DIAGNOSIS — Z79899 Other long term (current) drug therapy: Secondary | ICD-10-CM | POA: Insufficient documentation

## 2023-10-03 HISTORY — DX: Other chronic pain: G89.29

## 2023-10-03 LAB — CBC WITH DIFFERENTIAL/PLATELET
Abs Immature Granulocytes: 0.02 K/uL (ref 0.00–0.07)
Basophils Absolute: 0.1 K/uL (ref 0.0–0.1)
Basophils Relative: 1 %
Eosinophils Absolute: 0.3 K/uL (ref 0.0–0.5)
Eosinophils Relative: 3 %
HCT: 41.9 % (ref 39.0–52.0)
Hemoglobin: 14.6 g/dL (ref 13.0–17.0)
Immature Granulocytes: 0 %
Lymphocytes Relative: 33 %
Lymphs Abs: 3.1 K/uL (ref 0.7–4.0)
MCH: 32.6 pg (ref 26.0–34.0)
MCHC: 34.8 g/dL (ref 30.0–36.0)
MCV: 93.5 fL (ref 80.0–100.0)
Monocytes Absolute: 0.8 K/uL (ref 0.1–1.0)
Monocytes Relative: 8 %
Neutro Abs: 5.2 K/uL (ref 1.7–7.7)
Neutrophils Relative %: 55 %
Platelets: 311 K/uL (ref 150–400)
RBC: 4.48 MIL/uL (ref 4.22–5.81)
RDW: 11.7 % (ref 11.5–15.5)
WBC: 9.3 K/uL (ref 4.0–10.5)
nRBC: 0 % (ref 0.0–0.2)

## 2023-10-03 LAB — URINALYSIS, ROUTINE W REFLEX MICROSCOPIC
Bilirubin Urine: NEGATIVE
Glucose, UA: NEGATIVE mg/dL
Hgb urine dipstick: NEGATIVE
Ketones, ur: NEGATIVE mg/dL
Leukocytes,Ua: NEGATIVE
Nitrite: NEGATIVE
Protein, ur: NEGATIVE mg/dL
Specific Gravity, Urine: 1.005 (ref 1.005–1.030)
pH: 5.5 (ref 5.0–8.0)

## 2023-10-03 LAB — COMPREHENSIVE METABOLIC PANEL WITH GFR
ALT: 14 U/L (ref 0–44)
AST: 16 U/L (ref 15–41)
Albumin: 4.5 g/dL (ref 3.5–5.0)
Alkaline Phosphatase: 78 U/L (ref 38–126)
Anion gap: 14 (ref 5–15)
BUN: 11 mg/dL (ref 8–23)
CO2: 23 mmol/L (ref 22–32)
Calcium: 9.4 mg/dL (ref 8.9–10.3)
Chloride: 102 mmol/L (ref 98–111)
Creatinine, Ser: 0.92 mg/dL (ref 0.61–1.24)
GFR, Estimated: >60 mL/min (ref >60)
Glucose, Bld: 93 mg/dL (ref 70–99)
Potassium: 3.8 mmol/L (ref 3.5–5.1)
Sodium: 138 mmol/L (ref 135–145)
Total Bilirubin: 0.6 mg/dL (ref 0.0–1.2)
Total Protein: 7.2 g/dL (ref 6.5–8.1)

## 2023-10-03 LAB — TROPONIN T, HIGH SENSITIVITY: Troponin T High Sensitivity: <15 ng/L (ref 0–19)

## 2023-10-03 LAB — LIPASE, BLOOD: Lipase: 15 U/L (ref 11–51)

## 2023-10-03 MED ORDER — PREDNISONE 10 MG (21) PO TBPK: ORAL_TABLET | ORAL | 0 refills | Status: DC

## 2023-10-03 MED ORDER — KETOROLAC TROMETHAMINE 60 MG/2ML IM SOLN
30.0000 mg | Freq: Once | INTRAMUSCULAR | Status: AC
  Administered 2023-10-03: 30 mg via INTRAMUSCULAR
  Filled 2023-10-03: qty 2

## 2023-10-03 MED ORDER — METHYLPREDNISOLONE SODIUM SUCC 125 MG IJ SOLR
125.0000 mg | Freq: Once | INTRAMUSCULAR | Status: AC
  Administered 2023-10-03: 125 mg via INTRAMUSCULAR
  Filled 2023-10-03: qty 2

## 2023-10-03 NOTE — ED Provider Notes (Signed)
 [place] EMERGENCY DEPARTMENT AT [facility] Provider Note   CSN: [id number] Arrival date & time: 10/03/23  1541     Patient presents with: Flank Pain   Troy French is a 63 y.o. male.   Patient complains of pain in his left lower back.  Patient reports he has been having pain for the past week.  Patient has a past medical history of low back pain and he is followed by pain management.  Patient states he also has had injections to his spine in the past that were helpful.  Patient reports he has not seen any blood in his urine he has not had any difficulty urinating.  Patient states when he was young he was told that he had urinary reflux.  Patient states that he had frequent urinary tract infections when he was a child.  Patient denies any new weakness he is not having any numbness.  Patient has a past medical history of hypertension coronary artery disease COPD hyperlipidemia.  Patient is a smoker.   Flank Pain       Prior to Admission medications   Medication Sig Start Date End Date Taking? Authorizing Provider  predniSONE  (STERAPRED UNI-PAK 21 TAB) 10 MG (21) TBPK tablet 6,5,4,3,2,1 taper 10/03/23  Yes Chanice Brenton K, PA-C  albuterol  (VENTOLIN  HFA) 108 (90 Base) MCG/ACT inhaler Inhale 2 puffs into the lungs every 6 (six) hours as needed for wheezing or shortness of breath.    [provider]  atorvastatin  (LIPITOR ) 40 MG tablet Take 1 tablet (40 mg total) by mouth daily. 11/29/20   Patwardhan, Newman PARAS, MD  ciclopirox  (PENLAC ) 8 % solution Apply topically at bedtime. Apply over nail and surrounding skin. Apply daily over previous coat. After seven (7) days, may remove with alcohol and continue cycle. 08/19/21   Gershon Donnice SAUNDERS, DPM  clopidogrel  (PLAVIX ) 75 MG tablet Take 1 tablet (75 mg total) by mouth daily. 04/28/20   Patwardhan, Newman PARAS, MD  cyclobenzaprine (FLEXERIL) 10 MG tablet Take 10 mg by mouth 3 (three) times daily. 03/11/21   [provider]   diazepam  (VALIUM ) 2 MG tablet Take 2 mg by mouth every 6 (six) hours as needed for anxiety.    [provider]  diltiazem  (DILACOR XR ) 240 MG 24 hr capsule Take 1 capsule (240 mg total) by mouth daily. 11/29/20   Patwardhan, Newman PARAS, MD  famotidine  (PEPCID ) 20 MG tablet Take 20 mg by mouth 2 (two) times daily. 12/30/20   [provider]  Fluticasone -Umeclidin-Vilant (TRELEGY ELLIPTA) 100-62.5-25 MCG/ACT AEPB Inhale 1 puff into the lungs daily. 12/06/20   [provider]  lisinopril  (ZESTRIL ) 10 MG tablet Take 1 tablet (10 mg total) by mouth daily. 02/26/19   Court Dorn PARAS, MD  loperamide  (IMODIUM ) 2 MG capsule Take 1 capsule (2 mg total) by mouth 4 (four) times daily as needed for diarrhea or loose stools. Patient not taking: Reported on 04/01/2021 10/21/19   Patsey Lot, MD  metoprolol  succinate (TOPROL -XL) 50 MG 24 hr tablet Take 50 mg by mouth daily. Take with or immediately following a meal.    [provider]  morphine  (MS CONTIN ) 30 MG 12 hr tablet Take 30 mg by mouth daily.    [provider]  oxyCODONE -acetaminophen  (PERCOCET) 10-325 MG tablet Take 1 tablet by mouth 3 (three) times daily as needed.    [provider]  pantoprazole  (PROTONIX ) 40 MG tablet Take 1 tablet (40 mg total) by mouth daily. 02/11/19   Henry,  Manuelita NOVAK, NP  prednisoLONE  acetate (PRED FORTE ) 1 % ophthalmic suspension Place 1 drop into the left eye in the morning, at noon, in the evening, and at bedtime. 02/02/21   [provider]  ranolazine  (RANEXA ) 500 MG 12 hr tablet Take 1 tablet (500 mg total) by mouth 2 (two) times daily. Patient taking differently: Take 500 mg by mouth daily. 11/29/20   Patwardhan, Newman PARAS, MD  SPIRIVA  RESPIMAT 2.5 MCG/ACT AERS Inhale 2 puffs into the lungs daily.  Patient not taking: Reported on 04/01/2021 01/13/19   [provider]  triamcinolone  (KENALOG ) 0.025 % ointment Apply 1 application. topically 2 (two) times  daily. 05/16/21   Gershon Donnice SAUNDERS, DPM  zolpidem  (AMBIEN ) 10 MG tablet Take 10 mg by mouth at bedtime.  01/27/19   [provider]    Allergies: Nitroglycerin and Isosorbide dinitrate    Review of Systems  Genitourinary:  Positive for flank pain.  All other systems reviewed and are negative.   Updated Vital Signs BP 114/79 (BP Location: Right Arm)   Pulse 75   Temp 98 F (36.7 C)   Resp 18   Ht 5' 7 (1.702 m)   Wt 74.8 kg   SpO2 100%   BMI 25.84 kg/m   Physical Exam Vitals and nursing note reviewed.  Constitutional:      Appearance: He is well-developed.  HENT:     Head: Normocephalic.     Mouth/Throat:     Mouth: Mucous membranes are moist.  Eyes:     Pupils: Pupils are equal, round, and reactive to light.  Cardiovascular:     Rate and Rhythm: Normal rate.  Pulmonary:     Effort: Pulmonary effort is normal.  Abdominal:     General: There is no distension.  Musculoskeletal:        General: Normal range of motion.     Cervical back: Normal range of motion.     Comments: Tender left lower lumbar spine  Skin:    General: Skin is warm.  Neurological:     General: No focal deficit present.     Mental Status: He is alert and oriented to person, place, and time.     (all labs ordered are listed, but only abnormal results are displayed) Labs Reviewed  URINALYSIS, ROUTINE W REFLEX MICROSCOPIC - Abnormal; Notable for the following components:      Result Value   Color, Urine COLORLESS (*)    All other components within normal limits  CBC WITH DIFFERENTIAL/PLATELET  COMPREHENSIVE METABOLIC PANEL WITH GFR  LIPASE, BLOOD  TROPONIN T, HIGH SENSITIVITY    EKG: EKG Interpretation Date/Time:  Wednesday October 03 2023 16:12:46 EDT Ventricular Rate:  71 PR Interval:  157 QRS Duration:  87 QT Interval:  377 QTC Calculation: 410 R Axis:   31  Text Interpretation: Sinus rhythm Confirmed by Ruthe Cornet [phone]) on 10/03/2023 4:14:44 PM  Radiology: CT  Renal Stone Study Result Date: 10/03/2023 CLINICAL DATA:  Abdominal/flank pain, stone suspected EXAM: CT ABDOMEN AND PELVIS WITHOUT CONTRAST TECHNIQUE: Multidetector CT imaging of the abdomen and pelvis was performed following the standard protocol without IV contrast. RADIATION DOSE REDUCTION: This exam was performed according to the departmental dose-optimization program which includes automated exposure control, adjustment of the mA and/or kV according to patient size and/or use of iterative reconstruction technique. COMPARISON:  04/01/2021 FINDINGS: Of note, the lack of intravenous contrast limits evaluation of the solid organ parenchyma and vascularity. Lower chest: No focal airspace consolidation  or pleural effusion. Right lower lobe calcified granulomas. Dense coronary atherosclerosis in the right coronary artery. Hepatobiliary: No mass.No radiopaque stones or wall thickening of the gallbladder. No intrahepatic or extrahepatic biliary ductal dilation. Pancreas: No mass or main ductal dilation. No peripancreatic inflammation or fluid collection. Spleen: Normal size. No mass. Adrenals/Urinary Tract: No adrenal masses. A couple of small cysts noted in both lower poles of the kidneys. No hydronephrosis or nephrolithiasis. The urinary bladder is distended without focal abnormality. Stomach/Bowel: The stomach is decompressed without focal abnormality. No small bowel wall thickening or inflammation. No small bowel obstruction.Normal appendix. Scattered colonic diverticulosis. No changes of acute diverticulitis. Vascular/Lymphatic: No aortic aneurysm. Diffuse aortoiliac atherosclerosis. No intraabdominal or pelvic lymphadenopathy. Circumaortic left renal vein. Reproductive: No prostatomegaly. No free pelvic fluid. Other: No pneumoperitoneum, ascites, or mesenteric inflammation. Musculoskeletal: No acute fracture or destructive lesion. Multilevel degenerative disc disease of the spine. IMPRESSION: No acute  intra-abdominal or pelvic abnormality. Electronically Signed   By: Rogelia Myers M.D.   On: 10/03/2023 18:42   DG Chest 2 View Result Date: 10/03/2023 CLINICAL DATA:  Pain.  Left flank pain for 2 weeks. EXAM: CHEST - 2 VIEW COMPARISON:  04/01/2021 FINDINGS: Normal heart size and pulmonary vascularity. No focal airspace disease or consolidation in the lungs. No blunting of costophrenic angles. No pneumothorax. Mediastinal contours appear intact. Calcification of the aorta. Calcified mediastinal lymph nodes. Degenerative changes in the spine and shoulders. Coronary artery stents. IMPRESSION: No active cardiopulmonary disease. Electronically Signed   By: Elsie Gravely M.D.   On: 10/03/2023 18:17     Procedures   Medications Ordered in the ED  methylPREDNISolone  sodium succinate (SOLU-MEDROL ) 125 mg/2 mL injection 125 mg (125 mg Intramuscular Given 10/03/23 2019)  ketorolac  (TORADOL ) injection 30 mg (30 mg Intramuscular Given 10/03/23 2018)                                    Medical Decision Making Patient complains of pain in his low back for the past week.  This is worse than his chronic back pain.  Amount and/or Complexity of Data Reviewed Labs: ordered. Decision-making details documented in ED Course.    Details: Labs ordered reviewed and interpreted urine is negative for blood. Radiology: ordered and independent interpretation performed. Decision-making details documented in ED Course.    Details: CT renal shows no evidence of kidney stone or kidney disease.  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  Risk Prescription drug management. Risk Details: Patient counseled on results of his labs and his CT scan.  Patient is given an injection of Solu-Medrol  and Toradol  here to help with his symptoms.  Patient is placed on a prednisone  Dosepak.  He is advised to follow-up with his primary care physician with his pain management physician.  He is discharged in stable  condition.        Final diagnoses:  Acute left-sided low back pain without sciatica    ED Discharge Orders          Ordered    predniSONE  (STERAPRED UNI-PAK 21 TAB) 10 MG (21) TBPK tablet       Note to Pharmacy: Please provide 6 day taper dose pack   10/03/23 2001            An After Visit Summary was printed and given to the patient.    Flint Sonny POUR, PA-C 10/03/23 2104  Ruthe Cornet, DO 10/03/23 2241

## 2023-10-03 NOTE — ED Triage Notes (Signed)
 Pt c/o left flank pain approx 2 weeks ago Denies dysuria or hx of kidney stones

## 2023-10-03 NOTE — Discharge Instructions (Addendum)
Return if any problems.  See your Physician for recheck  °

## 2023-10-05 ENCOUNTER — Encounter (HOSPITAL_COMMUNITY): Payer: Self-pay | Admitting: Internal Medicine

## 2023-10-05 ENCOUNTER — Inpatient Hospital Stay (HOSPITAL_COMMUNITY)
Admission: EM | Admit: 2023-10-05 | Discharge: 2023-10-12 | DRG: 392 | Disposition: A | Discharge status: Home Health | Attending: Family Medicine | Admitting: Family Medicine

## 2023-10-05 ENCOUNTER — Emergency Department (HOSPITAL_COMMUNITY)

## 2023-10-05 ENCOUNTER — Other Ambulatory Visit: Payer: Self-pay

## 2023-10-05 DIAGNOSIS — F411 Generalized anxiety disorder: Secondary | ICD-10-CM | POA: Diagnosis present

## 2023-10-05 DIAGNOSIS — Z8679 Personal history of other diseases of the circulatory system: Secondary | ICD-10-CM

## 2023-10-05 DIAGNOSIS — Z716 Tobacco abuse counseling: Secondary | ICD-10-CM

## 2023-10-05 DIAGNOSIS — G894 Chronic pain syndrome: Secondary | ICD-10-CM | POA: Diagnosis not present

## 2023-10-05 DIAGNOSIS — R112 Nausea with vomiting, unspecified: Secondary | ICD-10-CM

## 2023-10-05 DIAGNOSIS — G4733 Obstructive sleep apnea (adult) (pediatric): Secondary | ICD-10-CM | POA: Diagnosis present

## 2023-10-05 DIAGNOSIS — F17210 Nicotine dependence, cigarettes, uncomplicated: Secondary | ICD-10-CM | POA: Diagnosis not present

## 2023-10-05 DIAGNOSIS — Z7951 Long term (current) use of inhaled steroids: Secondary | ICD-10-CM

## 2023-10-05 DIAGNOSIS — K5732 Diverticulitis of large intestine without perforation or abscess without bleeding: Principal | ICD-10-CM | POA: Diagnosis present

## 2023-10-05 DIAGNOSIS — K5903 Drug induced constipation: Secondary | ICD-10-CM | POA: Diagnosis present

## 2023-10-05 DIAGNOSIS — Z833 Family history of diabetes mellitus: Secondary | ICD-10-CM

## 2023-10-05 DIAGNOSIS — K5909 Other constipation: Secondary | ICD-10-CM | POA: Diagnosis present

## 2023-10-05 DIAGNOSIS — I2510 Atherosclerotic heart disease of native coronary artery without angina pectoris: Secondary | ICD-10-CM | POA: Diagnosis present

## 2023-10-05 DIAGNOSIS — R1084 Generalized abdominal pain: Principal | ICD-10-CM

## 2023-10-05 DIAGNOSIS — F39 Unspecified mood [affective] disorder: Secondary | ICD-10-CM | POA: Diagnosis present

## 2023-10-05 DIAGNOSIS — Z79891 Long term (current) use of opiate analgesic: Secondary | ICD-10-CM

## 2023-10-05 DIAGNOSIS — T402X5A Adverse effect of other opioids, initial encounter: Secondary | ICD-10-CM | POA: Diagnosis present

## 2023-10-05 DIAGNOSIS — I10 Essential (primary) hypertension: Secondary | ICD-10-CM | POA: Diagnosis not present

## 2023-10-05 DIAGNOSIS — N5082 Scrotal pain: Secondary | ICD-10-CM | POA: Diagnosis present

## 2023-10-05 DIAGNOSIS — Z79899 Other long term (current) drug therapy: Secondary | ICD-10-CM

## 2023-10-05 DIAGNOSIS — Z888 Allergy status to other drugs, medicaments and biological substances status: Secondary | ICD-10-CM

## 2023-10-05 DIAGNOSIS — Z955 Presence of coronary angioplasty implant and graft: Secondary | ICD-10-CM

## 2023-10-05 DIAGNOSIS — K5792 Diverticulitis of intestine, part unspecified, without perforation or abscess without bleeding: Secondary | ICD-10-CM

## 2023-10-05 DIAGNOSIS — Z8249 Family history of ischemic heart disease and other diseases of the circulatory system: Secondary | ICD-10-CM

## 2023-10-05 DIAGNOSIS — Z8709 Personal history of other diseases of the respiratory system: Secondary | ICD-10-CM

## 2023-10-05 DIAGNOSIS — M5136 Other intervertebral disc degeneration, lumbar region with discogenic back pain only: Secondary | ICD-10-CM | POA: Diagnosis present

## 2023-10-05 DIAGNOSIS — Z7902 Long term (current) use of antithrombotics/antiplatelets: Secondary | ICD-10-CM

## 2023-10-05 DIAGNOSIS — J4489 Other specified chronic obstructive pulmonary disease: Secondary | ICD-10-CM | POA: Diagnosis present

## 2023-10-05 DIAGNOSIS — G4700 Insomnia, unspecified: Secondary | ICD-10-CM | POA: Diagnosis present

## 2023-10-05 DIAGNOSIS — K5649 Other impaction of intestine: Secondary | ICD-10-CM | POA: Diagnosis present

## 2023-10-05 DIAGNOSIS — M48061 Spinal stenosis, lumbar region without neurogenic claudication: Secondary | ICD-10-CM | POA: Diagnosis present

## 2023-10-05 DIAGNOSIS — E785 Hyperlipidemia, unspecified: Secondary | ICD-10-CM | POA: Diagnosis present

## 2023-10-05 LAB — COMPREHENSIVE METABOLIC PANEL WITH GFR
ALT: 17 U/L (ref 0–44)
AST: 18 U/L (ref 15–41)
Albumin: 3.8 g/dL (ref 3.5–5.0)
Alkaline Phosphatase: 52 U/L (ref 38–126)
Anion gap: 13 (ref 5–15)
BUN: 17 mg/dL (ref 8–23)
CO2: 22 mmol/L (ref 22–32)
Calcium: 9.1 mg/dL (ref 8.9–10.3)
Chloride: 103 mmol/L (ref 98–111)
Creatinine, Ser: 1.07 mg/dL (ref 0.61–1.24)
GFR, Estimated: >60 mL/min (ref >60)
Glucose, Bld: 116 mg/dL — ABNORMAL HIGH (ref 70–99)
Potassium: 3.7 mmol/L (ref 3.5–5.1)
Sodium: 138 mmol/L (ref 135–145)
Total Bilirubin: 1 mg/dL (ref 0.0–1.2)
Total Protein: 6.4 g/dL — ABNORMAL LOW (ref 6.5–8.1)

## 2023-10-05 LAB — URINALYSIS, W/ REFLEX TO CULTURE (INFECTION SUSPECTED)
Bilirubin Urine: NEGATIVE
Glucose, UA: NEGATIVE mg/dL
Hgb urine dipstick: NEGATIVE
Ketones, ur: NEGATIVE mg/dL
Leukocytes,Ua: NEGATIVE
Nitrite: NEGATIVE
Protein, ur: NEGATIVE mg/dL
Specific Gravity, Urine: 1.017 (ref 1.005–1.030)
pH: 5 (ref 5.0–8.0)

## 2023-10-05 LAB — CBC WITH DIFFERENTIAL/PLATELET
Abs Immature Granulocytes: 0.17 K/uL — ABNORMAL HIGH (ref 0.00–0.07)
Basophils Absolute: 0 K/uL (ref 0.0–0.1)
Basophils Relative: 0 %
Eosinophils Absolute: 0 K/uL (ref 0.0–0.5)
Eosinophils Relative: 0 %
HCT: 38.5 % — ABNORMAL LOW (ref 39.0–52.0)
Hemoglobin: 13.3 g/dL (ref 13.0–17.0)
Immature Granulocytes: 1 %
Lymphocytes Relative: 12 %
Lymphs Abs: 2.1 K/uL (ref 0.7–4.0)
MCH: 32 pg (ref 26.0–34.0)
MCHC: 34.5 g/dL (ref 30.0–36.0)
MCV: 92.8 fL (ref 80.0–100.0)
Monocytes Absolute: 0.9 K/uL (ref 0.1–1.0)
Monocytes Relative: 5 %
Neutro Abs: 14.2 K/uL — ABNORMAL HIGH (ref 1.7–7.7)
Neutrophils Relative %: 82 %
Platelets: 299 K/uL (ref 150–400)
RBC: 4.15 MIL/uL — ABNORMAL LOW (ref 4.22–5.81)
RDW: 11.7 % (ref 11.5–15.5)
WBC: 17.4 K/uL — ABNORMAL HIGH (ref 4.0–10.5)
nRBC: 0 % (ref 0.0–0.2)

## 2023-10-05 LAB — RAPID URINE DRUG SCREEN, HOSP PERFORMED
Amphetamines: NOT DETECTED
Barbiturates: NOT DETECTED
Benzodiazepines: POSITIVE — AB
Cocaine: NOT DETECTED
Opiates: POSITIVE — AB
Tetrahydrocannabinol: NOT DETECTED

## 2023-10-05 LAB — I-STAT CHEM 8, ED
BUN: 16 mg/dL (ref 8–23)
Calcium, Ion: 1.08 mmol/L — ABNORMAL LOW (ref 1.15–1.40)
Chloride: 106 mmol/L (ref 98–111)
Creatinine, Ser: 1 mg/dL (ref 0.61–1.24)
Glucose, Bld: 109 mg/dL — ABNORMAL HIGH (ref 70–99)
HCT: 35 % — ABNORMAL LOW (ref 39.0–52.0)
Hemoglobin: 11.9 g/dL — ABNORMAL LOW (ref 13.0–17.0)
Potassium: 3.6 mmol/L (ref 3.5–5.1)
Sodium: 139 mmol/L (ref 135–145)
TCO2: 20 mmol/L — ABNORMAL LOW (ref 22–32)

## 2023-10-05 LAB — TROPONIN I (HIGH SENSITIVITY)
Troponin I (High Sensitivity): 5 ng/L (ref <18)
Troponin I (High Sensitivity): 6 ng/L (ref <18)

## 2023-10-05 LAB — LIPASE, BLOOD: Lipase: 13 U/L (ref 11–51)

## 2023-10-05 LAB — ETHANOL: Alcohol, Ethyl (B): <15 mg/dL (ref <15)

## 2023-10-05 MED ORDER — SODIUM CHLORIDE 0.9 % IV BOLUS
1000.0000 mL | Freq: Once | INTRAVENOUS | Status: AC
  Administered 2023-10-05: 1000 mL via INTRAVENOUS

## 2023-10-05 MED ORDER — ACETAMINOPHEN 10 MG/ML IV SOLN: 1000.0000 mg | Freq: Once | INTRAVENOUS | Status: DC

## 2023-10-05 MED ORDER — SODIUM CHLORIDE 0.9% FLUSH
3.0000 mL | Freq: Two times a day (BID) | INTRAVENOUS | Status: DC
  Administered 2023-10-06 – 2023-10-12 (×10): 3 mL via INTRAVENOUS

## 2023-10-05 MED ORDER — ATORVASTATIN CALCIUM 40 MG PO TABS
40.0000 mg | ORAL_TABLET | Freq: Every day | ORAL | Status: DC
  Administered 2023-10-06 – 2023-10-12 (×7): 40 mg via ORAL
  Filled 2023-10-05 (×7): qty 1

## 2023-10-05 MED ORDER — ENOXAPARIN SODIUM 40 MG/0.4ML IJ SOSY
40.0000 mg | PREFILLED_SYRINGE | INTRAMUSCULAR | Status: DC
  Administered 2023-10-05 – 2023-10-11 (×7): 40 mg via SUBCUTANEOUS
  Filled 2023-10-05 (×7): qty 0.4

## 2023-10-05 MED ORDER — NICOTINE 21 MG/24HR TD PT24
21.0000 mg | MEDICATED_PATCH | Freq: Every day | TRANSDERMAL | Status: DC
Start: 2023-10-05 — End: 2023-10-12
  Administered 2023-10-05 – 2023-10-12 (×8): 21 mg via TRANSDERMAL
  Filled 2023-10-05 (×8): qty 1

## 2023-10-05 MED ORDER — OXYCODONE-ACETAMINOPHEN 10-325 MG PO TABS: 1.0000 | ORAL_TABLET | Freq: Three times a day (TID) | ORAL | Status: DC | PRN

## 2023-10-05 MED ORDER — ACETAMINOPHEN 325 MG PO TABS
650.0000 mg | ORAL_TABLET | Freq: Four times a day (QID) | ORAL | Status: DC | PRN
  Administered 2023-10-07: 650 mg via ORAL
  Filled 2023-10-05 (×2): qty 2

## 2023-10-05 MED ORDER — DIAZEPAM 2 MG PO TABS: 2.0000 mg | ORAL_TABLET | Freq: Four times a day (QID) | ORAL | Status: DC | PRN

## 2023-10-05 MED ORDER — DIAZEPAM 2 MG PO TABS
2.0000 mg | ORAL_TABLET | Freq: Two times a day (BID) | ORAL | Status: DC | PRN
  Administered 2023-10-06 – 2023-10-11 (×7): 2 mg via ORAL
  Filled 2023-10-05 (×9): qty 1

## 2023-10-05 MED ORDER — LISINOPRIL 10 MG PO TABS
10.0000 mg | ORAL_TABLET | Freq: Every day | ORAL | Status: DC
  Administered 2023-10-06 – 2023-10-12 (×7): 10 mg via ORAL
  Filled 2023-10-05 (×7): qty 1

## 2023-10-05 MED ORDER — IPRATROPIUM-ALBUTEROL 0.5-2.5 (3) MG/3ML IN SOLN: 3.0000 mL | RESPIRATORY_TRACT | Status: DC | PRN

## 2023-10-05 MED ORDER — SODIUM CHLORIDE 0.9% FLUSH
3.0000 mL | Freq: Two times a day (BID) | INTRAVENOUS | Status: DC
  Administered 2023-10-05 – 2023-10-11 (×9): 3 mL via INTRAVENOUS

## 2023-10-05 MED ORDER — LORAZEPAM 2 MG/ML IJ SOLN
2.0000 mg | Freq: Once | INTRAMUSCULAR | Status: AC
  Administered 2023-10-05: 2 mg via INTRAVENOUS

## 2023-10-05 MED ORDER — ONDANSETRON 4 MG PO TBDP
4.0000 mg | ORAL_TABLET | Freq: Once | ORAL | Status: AC
  Administered 2023-10-05: 4 mg via ORAL

## 2023-10-05 MED ORDER — ACETAMINOPHEN 650 MG RE SUPP: 650.0000 mg | Freq: Four times a day (QID) | RECTAL | Status: DC | PRN

## 2023-10-05 MED ORDER — METOCLOPRAMIDE HCL 5 MG/ML IJ SOLN
10.0000 mg | Freq: Once | INTRAMUSCULAR | Status: AC
  Administered 2023-10-05: 10 mg via INTRAVENOUS
  Filled 2023-10-05: qty 2

## 2023-10-05 MED ORDER — LACTATED RINGERS IV SOLN: INTRAVENOUS | Status: AC

## 2023-10-05 MED ORDER — SODIUM CHLORIDE 0.9 % IV SOLN: 250.0000 mL | INTRAVENOUS | Status: AC | PRN

## 2023-10-05 MED ORDER — BUDESON-GLYCOPYRROL-FORMOTEROL 160-9-4.8 MCG/ACT IN AERO
2.0000 | INHALATION_SPRAY | Freq: Two times a day (BID) | RESPIRATORY_TRACT | Status: DC
  Administered 2023-10-06 – 2023-10-12 (×12): 2 via RESPIRATORY_TRACT
  Filled 2023-10-05: qty 5.9

## 2023-10-05 MED ORDER — OXYCODONE HCL 5 MG PO TABS
5.0000 mg | ORAL_TABLET | Freq: Three times a day (TID) | ORAL | Status: DC | PRN
Start: 2023-10-05 — End: 2023-10-06
  Administered 2023-10-06: 5 mg via ORAL
  Filled 2023-10-05 (×2): qty 1

## 2023-10-05 MED ORDER — PANTOPRAZOLE SODIUM 40 MG PO TBEC
40.0000 mg | DELAYED_RELEASE_TABLET | Freq: Every day | ORAL | Status: DC
  Administered 2023-10-06 – 2023-10-12 (×7): 40 mg via ORAL
  Filled 2023-10-05 (×7): qty 1

## 2023-10-05 MED ORDER — POLYETHYLENE GLYCOL 3350 17 G PO PACK
17.0000 g | PACK | Freq: Every day | ORAL | Status: DC
  Administered 2023-10-05: 17 g via ORAL
  Filled 2023-10-05: qty 1

## 2023-10-05 MED ORDER — ONDANSETRON HCL 4 MG/2ML IJ SOLN
4.0000 mg | Freq: Once | INTRAMUSCULAR | Status: AC
  Administered 2023-10-05: 4 mg via INTRAVENOUS
  Filled 2023-10-05: qty 2

## 2023-10-05 MED ORDER — SENNOSIDES-DOCUSATE SODIUM 8.6-50 MG PO TABS
1.0000 | ORAL_TABLET | Freq: Two times a day (BID) | ORAL | Status: DC
  Administered 2023-10-05: 1 via ORAL
  Filled 2023-10-05: qty 1

## 2023-10-05 MED ORDER — OXYCODONE-ACETAMINOPHEN 5-325 MG PO TABS
1.0000 | ORAL_TABLET | Freq: Three times a day (TID) | ORAL | Status: DC | PRN
Start: 2023-10-05 — End: 2023-10-06
  Administered 2023-10-06: 1 via ORAL
  Filled 2023-10-05: qty 1

## 2023-10-05 MED ORDER — ONDANSETRON HCL 4 MG/2ML IJ SOLN
4.0000 mg | Freq: Four times a day (QID) | INTRAMUSCULAR | Status: DC | PRN
  Administered 2023-10-06: 4 mg via INTRAVENOUS
  Filled 2023-10-05: qty 2

## 2023-10-05 MED ORDER — LIDOCAINE 5 % EX PTCH
1.0000 | MEDICATED_PATCH | CUTANEOUS | Status: DC
  Administered 2023-10-05 – 2023-10-11 (×7): 1 via TRANSDERMAL
  Filled 2023-10-05 (×7): qty 1

## 2023-10-05 MED ORDER — LORAZEPAM 2 MG/ML IJ SOLN
1.0000 mg | Freq: Once | INTRAMUSCULAR | Status: DC
  Filled 2023-10-05: qty 1

## 2023-10-05 MED ORDER — ONDANSETRON HCL 4 MG PO TABS: 4.0000 mg | ORAL_TABLET | Freq: Four times a day (QID) | ORAL | Status: DC | PRN

## 2023-10-05 MED ORDER — SODIUM CHLORIDE 0.9% FLUSH: 3.0000 mL | INTRAVENOUS | Status: DC | PRN

## 2023-10-05 MED ORDER — CLOPIDOGREL BISULFATE 75 MG PO TABS
75.0000 mg | ORAL_TABLET | Freq: Every day | ORAL | Status: DC
  Administered 2023-10-06 – 2023-10-12 (×7): 75 mg via ORAL
  Filled 2023-10-05 (×7): qty 1

## 2023-10-05 MED ORDER — FLEET ENEMA RE ENEM
1.0000 | ENEMA | Freq: Every day | RECTAL | Status: DC | PRN
  Administered 2023-10-07: 1 via RECTAL
  Filled 2023-10-05: qty 1

## 2023-10-05 NOTE — ED Provider Notes (Signed)
 [place] [facility] DE[facility]MENT AT [facility] Provider Note   CSN: [id number] Arrival date & time: 10/05/23  1251     Patient presents with: No chief complaint on file.   Troy French is a 63 y.o. male.   63 year old male with prior medical history as detailed below presents for evaluation.  Patient complains of chest pain, constipation, abdominal pain.  He arrives from home with EMS transport.  Interestingly he was seen at [facility] ED for similar complaints on 9/24.  Workup there - including noncontrast CT abdomen pelvis - did not show acute abnormality.  He was prescribed some prednisone  on 9/24 to treat his chronic back pain.  Per EMS, it appears that he may have taken  more than the first days dose -the patient cannot elucidate on this.  Patient is not forthcoming with regard to specific details of the events of the last 24 hours.  He is unable to quantify exactly when his chest pain began.  He reports that he has it almost every day.  He denies fever.  He reported initially that he did not have a bowel movement for a week.  He then reported that he had a small bowel movement yesterday.  He has passed flatus today.  The history is provided by the patient and medical records.       Prior to Admission medications   Medication Sig Start Date End Date Taking? Authorizing Provider  albuterol  (VENTOLIN  HFA) 108 (90 Base) MCG/ACT inhaler Inhale 2 puffs into the lungs every 6 (six) hours as needed for wheezing or shortness of breath.    [provider]  atorvastatin  (LIPITOR ) 40 MG tablet Take 1 tablet (40 mg total) by mouth daily. 11/29/20   Patwardhan, Newman PARAS, MD  ciclopirox  (PENLAC ) 8 % solution Apply topically at bedtime. Apply over nail and surrounding skin. Apply daily over previous coat. After seven (7) days, may remove with alcohol and continue cycle. 08/19/21   Gershon Donnice SAUNDERS, DPM  clopidogrel  (PLAVIX ) 75 MG tablet Take 1 tablet (75 mg total) by mouth  daily. 04/28/20   Patwardhan, Newman PARAS, MD  cyclobenzaprine (FLEXERIL) 10 MG tablet Take 10 mg by mouth 3 (three) times daily. 03/11/21   [provider]  diazepam  (VALIUM ) 2 MG tablet Take 2 mg by mouth every 6 (six) hours as needed for anxiety.    [provider]  diltiazem  (DILACOR XR ) 240 MG 24 hr capsule Take 1 capsule (240 mg total) by mouth daily. 11/29/20   Patwardhan, Newman PARAS, MD  famotidine  (PEPCID ) 20 MG tablet Take 20 mg by mouth 2 (two) times daily. 12/30/20   [provider]  Fluticasone -Umeclidin-Vilant (TRELEGY ELLIPTA) 100-62.5-25 MCG/ACT AEPB Inhale 1 puff into the lungs daily. 12/06/20   [provider]  lisinopril  (ZESTRIL ) 10 MG tablet Take 1 tablet (10 mg total) by mouth daily. 02/26/19   Court Dorn PARAS, MD  loperamide  (IMODIUM ) 2 MG capsule Take 1 capsule (2 mg total) by mouth 4 (four) times daily as needed for diarrhea or loose stools. Patient not taking: Reported on 04/01/2021 10/21/19   Patsey Lot, MD  metoprolol  succinate (TOPROL -XL) 50 MG 24 hr tablet Take 50 mg by mouth daily. Take with or immediately following a meal.    [provider]  morphine  (MS CONTIN ) 30 MG 12 hr tablet Take 30 mg by mouth daily.    [provider]  oxyCODONE -acetaminophen  (PERCOCET) 10-325 MG tablet Take 1 tablet by mouth 3 (three) times daily as  needed.    [provider]  pantoprazole  (PROTONIX ) 40 MG tablet Take 1 tablet (40 mg total) by mouth daily. 02/11/19   Henry Manuelita NOVAK, NP  prednisoLONE  acetate (PRED FORTE ) 1 % ophthalmic suspension Place 1 drop into the left eye in the morning, at noon, in the evening, and at bedtime. 02/02/21   [provider]  predniSONE  (STERAPRED UNI-PAK 21 TAB) 10 MG (21) TBPK tablet 6,5,4,3,2,1 taper 10/03/23   Sofia, Leslie K, PA-C  ranolazine  (RANEXA ) 500 MG 12 hr tablet Take 1 tablet (500 mg total) by mouth 2 (two) times daily. Patient taking differently: Take 500 mg by mouth daily.  11/29/20   Patwardhan, Newman PARAS, MD  SPIRIVA  RESPIMAT 2.5 MCG/ACT AERS Inhale 2 puffs into the lungs daily.  Patient not taking: Reported on 04/01/2021 01/13/19   [provider]  triamcinolone  (KENALOG ) 0.025 % ointment Apply 1 application. topically 2 (two) times daily. 05/16/21   Gershon Donnice SAUNDERS, DPM  zolpidem  (AMBIEN ) 10 MG tablet Take 10 mg by mouth at bedtime.  01/27/19   [provider]    Allergies: Nitroglycerin and Isosorbide dinitrate    Review of Systems  All other systems reviewed and are negative.   Updated Vital Signs Pulse 60   Temp 98.2 F (36.8 C) (Oral)   Resp 20   SpO2 100%   Physical Exam Vitals and nursing note reviewed.  Constitutional:      General: He is not in acute distress.    Appearance: Normal appearance. He is well-developed.  HENT:     Head: Normocephalic and atraumatic.  Eyes:     Conjunctiva/sclera: Conjunctivae normal.     Pupils: Pupils are equal, round, and reactive to light.  Cardiovascular:     Rate and Rhythm: Normal rate and regular rhythm.     Heart sounds: Normal heart sounds.  Pulmonary:     Effort: Pulmonary effort is normal. No respiratory distress.     Breath sounds: Normal breath sounds.  Abdominal:     General: There is no distension.     Palpations: Abdomen is soft.     Tenderness: There is no abdominal tenderness.  Musculoskeletal:        General: No deformity. Normal range of motion.     Cervical back: Normal range of motion and neck supple.  Skin:    General: Skin is warm and dry.  Neurological:     General: No focal deficit present.     Mental Status: He is alert and oriented to person, place, and time.     (all labs ordered are listed, but only abnormal results are displayed) Labs Reviewed  COMPREHENSIVE METABOLIC PANEL WITH GFR  CBC WITH DIFFERENTIAL/PLATELET  LIPASE, BLOOD  ETHANOL  URINALYSIS, W/ REFLEX TO CULTURE (INFECTION SUSPECTED)  RAPID URINE DRUG SCREEN, HOSP PERFORMED  TROPONIN  I (HIGH SENSITIVITY)    EKG: None  Radiology: CT Renal Stone Study Result Date: 10/03/2023 CLINICAL DATA:  Abdominal/flank pain, stone suspected EXAM: CT ABDOMEN AND PELVIS WITHOUT CONTRAST TECHNIQUE: Multidetector CT imaging of the abdomen and pelvis was performed following the standard protocol without IV contrast. RADIATION DOSE REDUCTION: This exam was performed according to the departmental dose-optimization program which includes automated exposure control, adjustment of the mA and/or kV according to patient size and/or use of iterative reconstruction technique. COMPARISON:  04/01/2021 FINDINGS: Of note, the lack of intravenous contrast limits evaluation of the solid organ parenchyma and vascularity. Lower chest: No focal airspace consolidation or pleural effusion. Right  lower lobe calcified granulomas. Dense coronary atherosclerosis in the right coronary artery. Hepatobiliary: No mass.No radiopaque stones or wall thickening of the gallbladder. No intrahepatic or extrahepatic biliary ductal dilation. Pancreas: No mass or main ductal dilation. No peripancreatic inflammation or fluid collection. Spleen: Normal size. No mass. Adrenals/Urinary Tract: No adrenal masses. A couple of small cysts noted in both lower poles of the kidneys. No hydronephrosis or nephrolithiasis. The urinary bladder is distended without focal abnormality. Stomach/Bowel: The stomach is decompressed without focal abnormality. No small bowel wall thickening or inflammation. No small bowel obstruction.Normal appendix. Scattered colonic diverticulosis. No changes of acute diverticulitis. Vascular/Lymphatic: No aortic aneurysm. Diffuse aortoiliac atherosclerosis. No intraabdominal or pelvic lymphadenopathy. Circumaortic left renal vein. Reproductive: No prostatomegaly. No free pelvic fluid. Other: No pneumoperitoneum, ascites, or mesenteric inflammation. Musculoskeletal: No acute fracture or destructive lesion. Multilevel degenerative  disc disease of the spine. IMPRESSION: No acute intra-abdominal or pelvic abnormality. Electronically Signed   By: Rogelia Myers M.D.   On: 10/03/2023 18:42   DG Chest 2 View Result Date: 10/03/2023 CLINICAL DATA:  Pain.  Left flank pain for 2 weeks. EXAM: CHEST - 2 VIEW COMPARISON:  04/01/2021 FINDINGS: Normal heart size and pulmonary vascularity. No focal airspace disease or consolidation in the lungs. No blunting of costophrenic angles. No pneumothorax. Mediastinal contours appear intact. Calcification of the aorta. Calcified mediastinal lymph nodes. Degenerative changes in the spine and shoulders. Coronary artery stents. IMPRESSION: No active cardiopulmonary disease. Electronically Signed   By: Elsie Gravely M.D.   On: 10/03/2023 18:17     Procedures   Medications Ordered in the ED - No data to display                                  Medical Decision Making Patient presents with chest pain, abdominal pain, nausea, agitation.  Initial screening labs obtained and are reassuringly without acute abnormality.  CT imaging obtained is also reassuringly without acute abnormality.  Oncoming ED provider aware of case and of pending labs and of need for reevaluation.  Amount and/or Complexity of Data Reviewed Labs: ordered. Radiology: ordered.  Risk Prescription drug management. Decision regarding hospitalization.        Final diagnoses:  Generalized abdominal pain  Nausea and vomiting, unspecified vomiting type    ED Discharge Orders     None          Laurice Maude BROCKS, MD 10/06/23 [phone]

## 2023-10-05 NOTE — ED Provider Notes (Signed)
 Care of the patient assumed at signout.  On signout patient awaiting urinalysis, CT.  CT without acute findings including obstruction, mass. No evidence for pneumonia.  Patient is not hypoxic, tachypneic or tachycardic. However, in spite of multiple antiemetics, analgesics patient remains intolerant of p.o., intractable nausea, vomiting, abdominal pain, now second visit in 2 days patient will be admitted for further monitoring, management.   Garrick Charleston, MD 10/05/23 2049

## 2023-10-05 NOTE — H&P (Addendum)
 History and Physical    Troy French FMW:[id number] DOB: February 13, 1960 DOA: 10/05/2023  PCP: Troy French Troy [name], FNP   Patient coming from: Home   Chief Complaint:  Chief Complaint  Patient presents with   Abdominal Pain   ED TRIAGE note:BIB [facility] from home for left side abd pain, distention and rigidity. and chest pressure. No BM in a week.  Nausea/ Vomiting. 148/88 BP 56HR 100 RA 127 CBG  HPI:  Troy French is a 63 y.o. male with medical history significant of CAD status post multiple cardiac stent, COPD, OSA not on CPAP, active smoking of cigarette, essential hypertension, hyperlipidemia, chronic pain syndrome, chronic back pain, and insomnia presented to emergency department second visit in 2 days with complaining of left-sided abdominal pain with distention and rigidity and constipation for 1 week.  Due to left-sided abdominal pain and distention patient reported it is radiating to left-sided chest as well.  Patient reported abdominal pain for 3 days with poor oral intake anorexia nausea vomiting.  Denies any fever, chill, cough, and diarrhea.  ED Course:  At presentation to ED patient is hemodynamically stable.  Afebrile.  CT chest abdomen pelvis showing emphysematous lung change.  No acute intra-abdominal or pelvic finding. IMPRESSION: 1. Emphysematous changes in the lungs. 2. Subpleural interstitial changes with mild bronchiectasis suggesting respiratory bronchiolitis. 3. Aortic atherosclerosis. 4. No acute process demonstrated in the abdomen or pelvis.  Chest x-ray showing Increased interstitial markings, which may reflect early pulmonary edema versus atypical inflammation or infection.  Lab work, UDS positive with opiate and benzodiazepine. CMP unremarkable.  CBC showing leukocytosis 17.4 otherwise unremarkable.  Normal lipase level.  Normal hepatic function.  Troponin x 2 within normal range.  Normal blood alcohol level.  UA no evidence of UTI.  EKG showed normal sinus  rhythm heart rate 60.  In the ED patient has multiple episode of vomiting required multiple doses of Zofran , Reglan  and for anxiety received Ativan .  1 L of LR bolus given.  Patient has ED visit 2 days ago with similar complaint.  Hospitalist has been consulted for further evaluation management of intractable nausea vomiting and abdominal pain with associated constipation.  Significant labs in the ED: Lab Orders         Comprehensive metabolic panel         CBC with Differential         Lipase, blood         Ethanol         Urinalysis, w/ Reflex to Culture (Infection Suspected) -Urine, Clean Catch         Urine rapid drug screen (hosp performed)         CBC         Basic metabolic panel         I-stat chem 8, ED       Review of Systems:  Review of Systems  Constitutional:  Negative for chills, fever, malaise/fatigue and weight loss.  Cardiovascular:  Negative for chest pain and palpitations.  Gastrointestinal:  Positive for abdominal pain, nausea and vomiting. Negative for blood in stool, constipation, diarrhea, heartburn and melena.  Genitourinary:  Negative for dysuria and urgency.  Musculoskeletal:  Positive for back pain. Negative for falls, joint pain, myalgias and neck pain.  Neurological:  Negative for dizziness.  Psychiatric/Behavioral:  The patient is nervous/anxious.     Past Medical History:  Diagnosis Date   Arthritis    CHF (congestive heart failure) (HCC)    Chronic pain  COPD (chronic obstructive pulmonary disease) (HCC)    Coronary artery disease    Hyperlipidemia    Hypertension     Past Surgical History:  Procedure Laterality Date   cardiac stents     CORONARY ATHERECTOMY N/A 02/10/2019   Procedure: CORONARY ATHERECTOMY;  Surgeon: Court Dorn PARAS, MD;  Location: [facility] INVASIVE CV LAB;  Service: Cardiovascular;  Laterality: N/A;  Mid RCA   CORONARY STENT INTERVENTION N/A 02/10/2019   Procedure: CORONARY STENT INTERVENTION;  Surgeon: Court Dorn PARAS,  MD;  Location: [facility] INVASIVE CV LAB;  Service: Cardiovascular;  Laterality: N/A;  Mid RCA   LEFT HEART CATH AND CORONARY ANGIOGRAPHY N/A 02/10/2019   Procedure: LEFT HEART CATH AND CORONARY ANGIOGRAPHY;  Surgeon: Court Dorn PARAS, MD;  Location: [facility] INVASIVE CV LAB;  Service: Cardiovascular;  Laterality: N/A;   TEMPORARY PACEMAKER N/A 02/10/2019   Procedure: TEMPORARY PACEMAKER;  Surgeon: Court Dorn PARAS, MD;  Location: [facility] INVASIVE CV LAB;  Service: Cardiovascular;  Laterality: N/A;     reports that he has been smoking cigarettes. He has a 52 pack-year smoking history. He has never used smokeless tobacco. He reports that he does not currently use alcohol. He reports that he does not currently use drugs.  Allergies  Allergen Reactions   Nitroglycerin Nausea And Vomiting    SWEATING   Isosorbide Dinitrate Nausea And Vomiting    Family History  Problem Relation Age of Onset   Diabetes Mother    Diabetes Mellitus II Sister    Heart disease Brother     Prior to Admission medications   Medication Sig Start Date End Date Taking? Authorizing Provider  albuterol  (VENTOLIN  HFA) 108 (90 Base) MCG/ACT inhaler Inhale 2 puffs into the lungs every 6 (six) hours as needed for wheezing or shortness of breath.    [provider]  atorvastatin  (LIPITOR ) 40 MG tablet Take 1 tablet (40 mg total) by mouth daily. 11/29/20   Patwardhan, Newman PARAS, MD  ciclopirox  (PENLAC ) 8 % solution Apply topically at bedtime. Apply over nail and surrounding skin. Apply daily over previous coat. After seven (7) days, may remove with alcohol and continue cycle. 08/19/21   Gershon Donnice SAUNDERS, DPM  clopidogrel  (PLAVIX ) 75 MG tablet Take 1 tablet (75 mg total) by mouth daily. 04/28/20   Patwardhan, Newman PARAS, MD  cyclobenzaprine (FLEXERIL) 10 MG tablet Take 10 mg by mouth 3 (three) times daily. 03/11/21   [provider]  diazepam  (VALIUM ) 2 MG tablet Take 2 mg by mouth every 6 (six) hours as needed for anxiety.     [provider]  diltiazem  (DILACOR XR ) 240 MG 24 hr capsule Take 1 capsule (240 mg total) by mouth daily. 11/29/20   Patwardhan, Newman PARAS, MD  famotidine  (PEPCID ) 20 MG tablet Take 20 mg by mouth 2 (two) times daily. 12/30/20   [provider]  Fluticasone -Umeclidin-Vilant (TRELEGY ELLIPTA) 100-62.5-25 MCG/ACT AEPB Inhale 1 puff into the lungs daily. 12/06/20   [provider]  lisinopril  (ZESTRIL ) 10 MG tablet Take 1 tablet (10 mg total) by mouth daily. 02/26/19   Court Dorn PARAS, MD  loperamide  (IMODIUM ) 2 MG capsule Take 1 capsule (2 mg total) by mouth 4 (four) times daily as needed for diarrhea or loose stools. Patient not taking: Reported on 04/01/2021 10/21/19   Patsey Lot, MD  metoprolol  succinate (TOPROL -XL) 50 MG 24 hr tablet Take 50 mg by mouth daily. Take with or immediately following a meal.    [provider]  morphine  (MS  CONTIN) 30 MG 12 hr tablet Take 30 mg by mouth daily.    [provider]  oxyCODONE -acetaminophen  (PERCOCET) 10-325 MG tablet Take 1 tablet by mouth 3 (three) times daily as needed.    [provider]  pantoprazole  (PROTONIX ) 40 MG tablet Take 1 tablet (40 mg total) by mouth daily. 02/11/19   Henry Manuelita NOVAK, NP  prednisoLONE  acetate (PRED FORTE ) 1 % ophthalmic suspension Place 1 drop into the left eye in the morning, at noon, in the evening, and at bedtime. 02/02/21   [provider]  predniSONE  (STERAPRED UNI-PAK 21 TAB) 10 MG (21) TBPK tablet 6,5,4,3,2,1 taper 10/03/23   Sofia, Leslie K, PA-C  ranolazine  (RANEXA ) 500 MG 12 hr tablet Take 1 tablet (500 mg total) by mouth 2 (two) times daily. Patient taking differently: Take 500 mg by mouth daily. 11/29/20   Patwardhan, Newman PARAS, MD  SPIRIVA  RESPIMAT 2.5 MCG/ACT AERS Inhale 2 puffs into the lungs daily.  Patient not taking: Reported on 04/01/2021 01/13/19   [provider]  triamcinolone  (KENALOG ) 0.025 % ointment Apply 1 application.  topically 2 (two) times daily. 05/16/21   Gershon Donnice SAUNDERS, DPM  zolpidem  (AMBIEN ) 10 MG tablet Take 10 mg by mouth at bedtime.  01/27/19   [provider]     Physical Exam: Vitals:   10/05/23 2045 10/05/23 2056 10/06/23 0015 10/06/23 0353  BP: 132/68  124/65 (!) 121/59  Pulse: (!) 58  (!) 57 61  Resp: 13  16 18   Temp:  98 F (36.7 C) 98.4 F (36.9 C) 99.2 F (37.3 C)  TempSrc:   Oral Oral  SpO2: 100%  100% 98%    Physical Exam Vitals and nursing note reviewed.  Constitutional:      Appearance: He is not ill-appearing.  Cardiovascular:     Rate and Rhythm: Normal rate and regular rhythm.     Heart sounds: Normal heart sounds.  Abdominal:     General: Abdomen is scaphoid.     Palpations: Abdomen is soft.     Tenderness: There is no abdominal tenderness. There is no guarding or rebound.  Skin:    Capillary Refill: Capillary refill takes less than 2 seconds.  Neurological:     Mental Status: He is alert and oriented to person, place, and time.      Labs on Admission: I have personally reviewed following labs and imaging studies  CBC: Recent Labs  Lab 10/03/23 1609 10/05/23 1259 10/05/23 1424  WBC 9.3 17.4*  --   NEUTROABS 5.2 14.2*  --   HGB 14.6 13.3 11.9*  HCT 41.9 38.5* 35.0*  MCV 93.5 92.8  --   PLT 311 299  --    Basic Metabolic Panel: Recent Labs  Lab 10/03/23 1609 10/05/23 1259 10/05/23 1424  NA 138 138 139  K 3.8 3.7 3.6  CL 102 103 106  CO2 23 22  --   GLUCOSE 93 116* 109*  BUN 11 17 16   CREATININE 0.92 1.07 1.00  CALCIUM  9.4 9.1  --    GFR: Estimated Creatinine Clearance: 69.8 mL/min (by C-G formula based on SCr of 1 mg/dL). Liver Function Tests: Recent Labs  Lab 10/03/23 1609 10/05/23 1259  AST 16 18  ALT 14 17  ALKPHOS 78 52  BILITOT 0.6 1.0  PROT 7.2 6.4*  ALBUMIN 4.5 3.8   Recent Labs  Lab 10/03/23 1609 10/05/23 1259  LIPASE 15 13   No results for input(s): AMMONIA in the last 168 hours.  Coagulation  Profile: No results for input(s): INR, PROTIME in the last 168 hours. Cardiac Enzymes: Recent Labs  Lab 10/05/23 1259 10/05/23 1459  TROPONINIHS 5 6   BNP (last 3 results) No results for input(s): BNP in the last 8760 hours. HbA1C: No results for input(s): HGBA1C in the last 72 hours. CBG: No results for input(s): GLUCAP in the last 168 hours. Lipid Profile: No results for input(s): CHOL, HDL, LDLCALC, TRIG, CHOLHDL, LDLDIRECT in the last 72 hours. Thyroid  Function Tests: No results for input(s): TSH, T4TOTAL, FREET4, T3FREE, THYROIDAB in the last 72 hours. Anemia Panel: No results for input(s): VITAMINB12, FOLATE, FERRITIN, TIBC, IRON, RETICCTPCT in the last 72 hours. Urine analysis:    Component Value Date/Time   COLORURINE YELLOW 10/05/2023 1803   APPEARANCEUR CLEAR 10/05/2023 1803   LABSPEC 1.017 10/05/2023 1803   PHURINE 5.0 10/05/2023 1803   GLUCOSEU NEGATIVE 10/05/2023 1803   HGBUR NEGATIVE 10/05/2023 1803   BILIRUBINUR NEGATIVE 10/05/2023 1803   KETONESUR NEGATIVE 10/05/2023 1803   PROTEINUR NEGATIVE 10/05/2023 1803   NITRITE NEGATIVE 10/05/2023 1803   LEUKOCYTESUR NEGATIVE 10/05/2023 1803    Radiological Exams on Admission: I have personally reviewed images CT CHEST ABDOMEN PELVIS WO CONTRAST Result Date: 10/05/2023 CLINICAL DATA:  Sepsis. Left-sided abdominal pain, distension, and rigidity. Chest pressure. Nausea and vomiting. EXAM: CT CHEST, ABDOMEN AND PELVIS WITHOUT CONTRAST TECHNIQUE: Multidetector CT imaging of the chest, abdomen and pelvis was performed following the standard protocol without IV contrast. RADIATION DOSE REDUCTION: This exam was performed according to the departmental dose-optimization program which includes automated exposure control, adjustment of the mA and/or kV according to patient size and/or use of iterative reconstruction technique. COMPARISON:  Chest radiograph 10/05/2023. CT abdomen and pelvis  10/03/2023. CT chest 11/30/2021 FINDINGS: CT CHEST FINDINGS Cardiovascular: Normal heart size. No pericardial effusions. Calcification in the aorta and coronary arteries. Probable coronary stents. Normal caliber thoracic aorta. Mediastinum/Nodes: Thyroid  gland is unremarkable. Esophagus is decompressed. Calcified right hilar and mediastinal lymph nodes consistent with postinflammatory changes. No significant lymphadenopathy. Lungs/Pleura: Calcified granulomas in the right lung. Fine interstitial subpleural infiltrates in the lungs probably representing early interstitial lung disease or bronchiolitis. Mild bronchiectasis. Emphysematous changes in the lungs. No pleural effusion or pneumothorax. Scattered scarring in the lungs. Musculoskeletal: Degenerative changes in the spine. No acute bony abnormalities. CT ABDOMEN PELVIS FINDINGS Hepatobiliary: No focal liver abnormality is seen. No gallstones, gallbladder wall thickening, or biliary dilatation. Pancreas: Unremarkable. No pancreatic ductal dilatation or surrounding inflammatory changes. Spleen: Normal in size without focal abnormality. Calcified granulomas in the spleen. Adrenals/Urinary Tract: Adrenal glands are unremarkable. Kidneys are normal, without renal calculi, focal lesion, or hydronephrosis. Bladder is unremarkable. Stomach/Bowel: Stomach is within normal limits. Appendix appears normal. No evidence of bowel wall thickening, distention, or inflammatory changes. Vascular/Lymphatic: Aortic atherosclerosis. No enlarged abdominal or pelvic lymph nodes. Reproductive: Prostate is unremarkable. Other: No abdominal wall hernia or abnormality. No abdominopelvic ascites. Musculoskeletal: Degenerative changes in the spine. No acute bony abnormalities. IMPRESSION: 1. Emphysematous changes in the lungs. 2. Subpleural interstitial changes with mild bronchiectasis suggesting respiratory bronchiolitis. 3. Aortic atherosclerosis. 4. No acute process demonstrated in the  abdomen or pelvis. Electronically Signed   By: Elsie Gravely M.D.   On: 10/05/2023 15:50   DG Chest Port 1 View Result Date: 10/05/2023 EXAM: 1 VIEW(S) XRAY OF THE CHEST 10/05/2023 01:43:00 PM COMPARISON: 10/03/2023 CLINICAL HISTORY: cp. Per triage notes: BIB [facility] from home for left side abd pain, distention and rigidity. and chest pressure. No  BM in a week. Nausea/ Vomiting.  FINDINGS: LUNGS AND PLEURA: Increased interstitial markings which may reflect early pulmonary edema versus atypical inflammation or infection. No focal pulmonary opacity. No pleural effusion. No pneumothorax. HEART AND MEDIASTINUM: Calcified mediastinal lymph nodes. No acute abnormality of the cardiac and mediastinal silhouettes. BONES AND SOFT TISSUES: No acute osseous abnormality. IMPRESSION: 1. Increased interstitial markings, which may reflect early pulmonary edema versus atypical inflammation or infection. Electronically signed by: Waddell Calk MD 10/05/2023 02:18 PM EDT RP Workstation: [id number]     EKG: My personal interpretation of EKG shows:     Assessment/Plan: Principal Problem:   Intractable nausea and vomiting Active Problems:   Essential hypertension   Continuous dependence on cigarette smoking   History of COPD   Obstructive sleep apnea   History of CAD (coronary artery disease)   Chronic pain syndrome   Generalized anxiety disorder    Assessment and Plan: No notes have been filed under this hospital service. Service: Hospitalist Intractable nausea vomiting from excessive opioids and morphine  use Abdominal pain with associated nausea vomiting Constipation -Presenting to emergency department second time in last 2 days with complaining of left-sided abdominal pain with intractable nausea vomiting anorexia and poor appetite.  -CT chest/abdomen/pelvis no acute abdominal pelvic finding.  Emphysema.  And respiratory bronchitis. -CBC showing leukocytosis 17.4 otherwise unremarkable.  CMP  unremarkable evidence of AKI.  Normal lipase level.  Normal hepatic function.  Blood alcohol level normal.  UDS positive for benzodiazepine and opioids.  UA no evidence of UTI.  Troponin x 2 unremarkable.  EKG showing normal sinus rhythm heart rate 66. - Concern for intractable nausea vomiting in the setting of extensive morphine  and opioid use. - In the ED patient received Ativan , Zofran  and Reglan . - Also complaining about constipation which is secondary to underlying narcotics use.  Counseling patient to minimize use of narcotics however not sure how much it would be successful given patient is chronic pain medication/narcotic users. - Continue supportive care. - Starting maintenance fluid LR 75 cc/h for 1 day, continue clear liquid diet and once patient able to tolerate advance diet.  Chronic constipation secondary to chronic narcotics use - CT abdomen pelvis did not show any evidence of stool burden.  However patient continues to complaining about constipation and no bowel movement for last 7 days.  Continue MiraLAX , Senokot on daily basis and Fleet enema as needed for severe constipation.   Chronic pain syndrome Chronic back pain. - Reviewed PDMP patient is refilling oxycodone  and morphine  sulfate monthly basis. Per PDMP patient is refilling oxycodone  total 150 tablet every 30 days and morphine  sulfate 30 mg every 30 days. -Holding pain medications in the setting of intractable nausea vomiting.  Applying lidocaine  patch and K-pad to the back. Update, Patient is already requesting for oral oxycodone  which I is trying to avoid initially as patient is having intractable nausea vomiting abdominal pain in the setting of narcotics.  Discontinuing IV Tylenol  as patient eventually will get oral oxycodone  for pain management.   Generalized anxiety disorder Reviewed PDMP patient is refilling diazepam  2 mg 60 tablet every 30 days  Insomnia - Per PDMP patient is refilling zolpidem  30 tablets  monthly basis.  History of CAD -Per chart review of the cardiology clinic visit in December 2024 patient supposed to be on metoprolol  succinate 100 mg daily, Ranexa , lisinopril  10 mg daily, Lipitor  40 mg daily and Plavix  75 mg daily. -Patient only able to recognize taking Plavix , pain medication and Protonix . -Based on  pharmacy verification medication resuming Toprol -XL, Ranexa , Lipitor  and Plavix .   Essential hypertension -Continue Toprol -XL  and lisinopril .  holding Lasix as patient has poor oral intake.  History of COPD -Continue DuoNeb as needed and Breztri  twice daily  Chronic smoking cigarette Counseling provided for over 10 minutes regarding smoking cessation.  Continue nicotine  patch.  DVT prophylaxis:  Lovenox  Code Status:  Full Code Diet: Heart healthy diet Family Communication:   Family was present at bedside, at the time of interview. Opportunity was given to ask question and all questions were answered satisfactorily.  Disposition Plan: Continue to monitor improvement of intractable nausea vomiting.  And abdominal pain. Consults: None indicated at this time Admission status:   Observation, Med-Surg  Severity of Illness: The appropriate patient status for this patient is OBSERVATION. Observation status is judged to be reasonable and necessary in order to provide the required intensity of service to ensure the patient's safety. The patient's presenting symptoms, physical exam findings, and initial radiographic and laboratory data in the context of their medical condition is felt to place them at decreased risk for further clinical deterioration. Furthermore, it is anticipated that the patient will be medically stable for discharge from the hospital within 2 midnights of admission.     Julene Rahn, MD [facility]  How to contact the [facility] Attending or Consulting provider 7A - 7P or covering provider during after hours 7P -7A, for this patient.  Check the care team  in [facility] and look for a) attending/consulting [facility] provider listed and b) the [facility] team listed Log into [email or web address] and use [place]'s universal password to access. If you do not have the password, please contact the hospital operator. Locate the [facility] provider you are looking for under [facility] and page to a number that you can be directly reached. If you still have difficulty reaching the provider, please page the [facility] (Director on Call) for the Hospitalists listed on amion for assistance.  10/06/2023, 4:32 AM

## 2023-10-05 NOTE — ED Triage Notes (Signed)
 BIB [facility] from home for left side abd pain, distention and rigidity. and chest pressure. No BM in a week.  Nausea/ Vomiting. 148/88 BP 56HR 100 RA 127 CBG

## 2023-10-06 ENCOUNTER — Observation Stay (HOSPITAL_COMMUNITY)

## 2023-10-06 DIAGNOSIS — R112 Nausea with vomiting, unspecified: Secondary | ICD-10-CM | POA: Diagnosis not present

## 2023-10-06 LAB — BASIC METABOLIC PANEL WITH GFR
Anion gap: 12 (ref 5–15)
BUN: 15 mg/dL (ref 8–23)
CO2: 21 mmol/L — ABNORMAL LOW (ref 22–32)
Calcium: 8.5 mg/dL — ABNORMAL LOW (ref 8.9–10.3)
Chloride: 105 mmol/L (ref 98–111)
Creatinine, Ser: 1.1 mg/dL (ref 0.61–1.24)
GFR, Estimated: >60 mL/min (ref >60)
Glucose, Bld: 103 mg/dL — ABNORMAL HIGH (ref 70–99)
Potassium: 3.9 mmol/L (ref 3.5–5.1)
Sodium: 138 mmol/L (ref 135–145)

## 2023-10-06 LAB — CBC
HCT: 37.1 % — ABNORMAL LOW (ref 39.0–52.0)
Hemoglobin: 12.7 g/dL — ABNORMAL LOW (ref 13.0–17.0)
MCH: 31.9 pg (ref 26.0–34.0)
MCHC: 34.2 g/dL (ref 30.0–36.0)
MCV: 93.2 fL (ref 80.0–100.0)
Platelets: 261 K/uL (ref 150–400)
RBC: 3.98 MIL/uL — ABNORMAL LOW (ref 4.22–5.81)
RDW: 11.9 % (ref 11.5–15.5)
WBC: 12.2 K/uL — ABNORMAL HIGH (ref 4.0–10.5)
nRBC: 0 % (ref 0.0–0.2)

## 2023-10-06 MED ORDER — METOPROLOL SUCCINATE ER 100 MG PO TB24
100.0000 mg | ORAL_TABLET | Freq: Every day | ORAL | Status: DC
  Administered 2023-10-06 – 2023-10-12 (×7): 100 mg via ORAL
  Filled 2023-10-06 (×7): qty 1

## 2023-10-06 MED ORDER — GADOBUTROL 1 MMOL/ML IV SOLN
8.0000 mL | Freq: Once | INTRAVENOUS | Status: AC | PRN
Start: 2023-10-06 — End: 2023-10-06
  Administered 2023-10-06: 8 mL via INTRAVENOUS

## 2023-10-06 MED ORDER — SENNOSIDES-DOCUSATE SODIUM 8.6-50 MG PO TABS
3.0000 | ORAL_TABLET | Freq: Two times a day (BID) | ORAL | Status: DC
Start: 2023-10-06 — End: 2023-10-07
  Administered 2023-10-06: 3 via ORAL
  Filled 2023-10-06: qty 3

## 2023-10-06 MED ORDER — OXYCODONE-ACETAMINOPHEN 7.5-325 MG PO TABS
2.0000 | ORAL_TABLET | Freq: Four times a day (QID) | ORAL | Status: DC | PRN
  Administered 2023-10-06 – 2023-10-08 (×7): 2 via ORAL
  Filled 2023-10-06 (×7): qty 2

## 2023-10-06 MED ORDER — SMOG ENEMA
960.0000 mL | Freq: Once | RECTAL | Status: AC
  Administered 2023-10-06: 960 mL via RECTAL
  Filled 2023-10-06: qty 960

## 2023-10-06 MED ORDER — MORPHINE SULFATE ER 30 MG PO TBCR
30.0000 mg | EXTENDED_RELEASE_TABLET | Freq: Every day | ORAL | Status: DC
  Administered 2023-10-06 – 2023-10-12 (×7): 30 mg via ORAL
  Filled 2023-10-06 (×7): qty 1

## 2023-10-06 MED ORDER — MONTELUKAST SODIUM 10 MG PO TABS
10.0000 mg | ORAL_TABLET | Freq: Every morning | ORAL | Status: DC
  Administered 2023-10-06 – 2023-10-12 (×7): 10 mg via ORAL
  Filled 2023-10-06 (×7): qty 1

## 2023-10-06 MED ORDER — SENNOSIDES-DOCUSATE SODIUM 8.6-50 MG PO TABS
2.0000 | ORAL_TABLET | Freq: Two times a day (BID) | ORAL | Status: DC
  Administered 2023-10-06: 2 via ORAL
  Filled 2023-10-06: qty 2

## 2023-10-06 MED ORDER — POLYETHYLENE GLYCOL 3350 17 G PO PACK
17.0000 g | PACK | Freq: Every day | ORAL | Status: DC
Start: 2023-10-06 — End: 2023-10-07
  Administered 2023-10-06: 17 g via ORAL
  Filled 2023-10-06: qty 1

## 2023-10-06 MED ORDER — OXYCODONE-ACETAMINOPHEN 5-325 MG PO TABS
2.0000 | ORAL_TABLET | ORAL | Status: DC | PRN
  Administered 2023-10-06: 2 via ORAL
  Filled 2023-10-06: qty 2

## 2023-10-06 MED ORDER — RANOLAZINE ER 500 MG PO TB12
1000.0000 mg | ORAL_TABLET | Freq: Every morning | ORAL | Status: DC
  Administered 2023-10-06 – 2023-10-12 (×7): 1000 mg via ORAL
  Filled 2023-10-06 (×7): qty 2

## 2023-10-06 MED ORDER — POLYETHYLENE GLYCOL 3350 17 G PO PACK
17.0000 g | PACK | Freq: Two times a day (BID) | ORAL | Status: DC
  Administered 2023-10-06: 17 g via ORAL
  Filled 2023-10-06: qty 1

## 2023-10-06 MED ORDER — GLYCERIN (LAXATIVE) 2 G RE SUPP
1.0000 | Freq: Once | RECTAL | Status: AC
Start: 2023-10-06 — End: 2023-10-06
  Administered 2023-10-06: 1 via RECTAL
  Filled 2023-10-06 (×2): qty 1

## 2023-10-06 MED ORDER — METHYLNALTREXONE BROMIDE 12 MG/0.6ML ~~LOC~~ SOLN: 12.0000 mg | Freq: Once | SUBCUTANEOUS | Status: DC

## 2023-10-06 NOTE — Plan of Care (Signed)

## 2023-10-06 NOTE — Plan of Care (Signed)

## 2023-10-06 NOTE — Plan of Care (Addendum)
  When I have informed and discussed with patient that use of long-acting morphine  and oxycodone  are causing intractable nausea-vomiting patient felt very upset, angry and stated that I am not a junky and I do not push heroin to myself.  Patient does not like to hear anything regarding narcotics associated intractable nausea-vomiting at all.  After apologizing to  patient at the bedside multiple times patient became normal and requesting for start oral narcotics immediately (takes Percocet 10 mg 5 times daily and morphine  30 mg twice daily).  Based on patient request I have resumed oxycodone  with low-dose 5 mg 3 times daily as needed, apply lidocaine  patch and K-pad to the back.  Desiree Fleming, MD [facility] 10/06/2023, 4:23 AM

## 2023-10-06 NOTE — Care Management Obs Status (Signed)
 MEDICARE OBSERVATION STATUS NOTIFICATION   Patient Details  Name: Troy French MRN: [id number] Date of Birth: [birth year]   Medicare Observation Status Notification Given:  Yes    Robynn Eileen Hoose, RN 10/06/2023, 10:12 AM

## 2023-10-06 NOTE — Progress Notes (Signed)
 PROGRESS NOTE  Troy French FMW:[id number] DOB: 09-Sep-1960 DOA: 10/05/2023 PCP: Claudene Prentice Troy [name], FNP   LOS: 0 days   Brief Narrative / Interim history: 63 y.o. male with medical history significant of CAD status post multiple cardiac stent, COPD, OSA not on CPAP, active smoking of cigarette, essential hypertension, hyperlipidemia, chronic pain syndrome, chronic back pain, and insomnia presented to emergency department second visit in 2 days with complaining of left-sided abdominal pain with distention and rigidity and constipation for 1 week.  He also has been experiencing significantly worse lumbar back pain.  Imaging on admission of CT chest, abdomen, pelvis did not show any acute intra-abdominal or pelvic findings  Subjective / 24h Interval events: Complains of significant abdominal pain this morning as well as back pain.  Has not had a bowel movement yet, also complains of nausea  Assesement and Plan: Principal problem Intractable nausea, vomiting, severe abdominal pain, constipation -this is likely in the setting of worsening constipation.  Patient has been on narcotic medications for a very long time, yet did not have this problem until now.  CT chest abdomen pelvis are unremarkable - Placed on aggressive bowel regimen but without success.  Try smog enema, if that does not work, perhaps we could use Relistor  Active problems Acute on chronic lower back pain-patient reports longstanding lower back pain, following with pain management and he is on chronic narcotics.  Feels like he is pain is significantly worse limiting his mobility, more so in the last few days.  Will obtain an MRI of the L-spine  GAD-continue home diazepam   Essential hypertension-continue metoprolol   Hyperlipidemia-continue statin  CAD-primary cardiologist at [facility], he has advanced cardiac disease with multiple stents in place, most recent cardiac cath September 2023 with 100% occlusion of the mid LCx and  mild to moderate CAD of the RCA and LAD.  He had a previously placed mid RCA to distal RCA stent, and most recently a proximal RCA DES placed in 2021. - Currently he is chest pain-free, continue atorvastatin , Plavix , ranolazine   History of COPD, ongoing tobacco use-continue breathing treatments, this is at baseline, no wheezing.  Counseled for cessation  Scheduled Meds:  atorvastatin   40 mg Oral Daily   budesonide -glycopyrrolate -formoterol   2 puff Inhalation BID   clopidogrel   75 mg Oral Daily   enoxaparin  (LOVENOX ) injection  40 mg Subcutaneous Q24H   lidocaine   1 patch Transdermal Q24H   lisinopril   10 mg Oral Daily   metoprolol  succinate  100 mg Oral Daily   montelukast  10 mg Oral q morning   morphine   30 mg Oral Daily   nicotine   21 mg Transdermal Daily   pantoprazole   40 mg Oral Daily   polyethylene glycol  17 g Oral Daily   ranolazine   1,000 mg Oral q morning   senna-docusate  3 tablet Oral BID   sodium chloride  flush  3 mL Intravenous Q12H   sodium chloride  flush  3 mL Intravenous Q12H   SMOG  960 mL Rectal Once   Continuous Infusions:  sodium chloride      lactated ringers  75 mL/hr at 10/06/23 1353   PRN Meds:.sodium chloride , acetaminophen  **OR** acetaminophen , diazepam , ipratropium-albuterol , ondansetron  **OR** ondansetron  (ZOFRAN ) IV, oxyCODONE -acetaminophen , sodium chloride  flush, sodium phosphate  Current Outpatient Medications  Medication Instructions   albuterol  (VENTOLIN  HFA) 108 (90 Base) MCG/ACT inhaler 2 puffs, Every 6 hours PRN   atorvastatin  (LIPITOR ) 40 mg, Oral, Daily   clopidogrel  (PLAVIX ) 75 mg, Oral, Daily   cyclobenzaprine (  FLEXERIL) 10 mg, 3 times daily   diazepam  (VALIUM ) 2 mg, Every 12 hours PRN   famotidine  (PEPCID ) 20 mg, 2 times daily   furosemide (LASIX) 40 mg, Oral, Daily   lisinopril  (ZESTRIL ) 10 mg, Oral, Daily   metoprolol  succinate (TOPROL -XL) 100 mg, Oral, Daily   montelukast (SINGULAIR) 10 mg, Oral, Every morning   morphine  (MS  CONTIN) 30 mg, Daily   oxyCODONE -acetaminophen  (PERCOCET) 10-325 MG tablet 1 tablet, 5 times daily   pantoprazole  (PROTONIX ) 40 mg, Oral, Daily   ranolazine  (RANEXA ) 1,000 mg, Oral, Every morning   tadalafil (CIALIS) 5 mg, Oral, Daily   tamsulosin (FLOMAX) 0.4 mg, Oral, Every evening   TRELEGY ELLIPTA 200-62.5-25 MCG/ACT AEPB SMARTSIG:2 Puff(s) Via Inhaler Daily   zolpidem  (AMBIEN ) 10 mg, Daily at bedtime    Diet Orders (From admission, onward)     Start     Ordered   10/06/23 1634  Diet full liquid Room service appropriate? Yes; Fluid consistency: Thin  Diet effective now       Question Answer Comment  Room service appropriate? Yes   Fluid consistency: Thin      10/06/23 1633            DVT prophylaxis: enoxaparin  (LOVENOX ) injection 40 mg Start: 10/05/23 2200 SCDs Start: 10/05/23 2106 Place TED hose Start: 10/05/23 2106   Lab Results  Component Value Date   PLT 261 10/06/2023      Code Status: Full Code  Family Communication: no family at bedside   Status is: Observation The patient will require care spanning > 2 midnights and should be moved to inpatient because: Persistent abdominal pain, nausea, persistent intractable low back pain   Level of care: Med-Surg  Consultants:  None  Objective: Vitals:   10/06/23 0353 10/06/23 0802 10/06/23 0850 10/06/23 1640  BP: (!) 121/59  120/71 113/72  Pulse: 61  (!) 55 (!) 59  Resp: 18  18 18   Temp: 99.2 F (37.3 C)  97.9 F (36.6 C) 98 F (36.7 C)  TempSrc: Oral  Oral Oral  SpO2: 98% 94% 97% 97%    Intake/Output Summary (Last 24 hours) at 10/06/2023 1640 Last data filed at 10/06/2023 0500 Gross per 24 hour  Intake 419.46 ml  Output 800 ml  Net -380.54 ml   Wt Readings from Last 3 Encounters:  10/03/23 74.8 kg  04/01/21 84.4 kg  11/29/20 84.4 kg    Examination:  Constitutional: NAD Eyes: no scleral icterus ENMT: Mucous membranes are moist.  Neck: normal, supple Respiratory: clear to auscultation  bilaterally, no wheezing, no crackles. Cardiovascular: Regular rate and rhythm, no murmurs / rubs / gallops. No LE edema.  Abdomen: non distended, no tenderness. Bowel sounds positive.  Musculoskeletal: no clubbing / cyanosis.   Data Reviewed: I have independently reviewed following labs and imaging studies   CBC Recent Labs  Lab 10/03/23 1609 10/05/23 1259 10/05/23 1424 10/06/23 0512  WBC 9.3 17.4*  --  12.2*  HGB 14.6 13.3 11.9* 12.7*  HCT 41.9 38.5* 35.0* 37.1*  PLT 311 299  --  261  MCV 93.5 92.8  --  93.2  MCH 32.6 32.0  --  31.9  MCHC 34.8 34.5  --  34.2  RDW 11.7 11.7  --  11.9  LYMPHSABS 3.1 2.1  --   --   MONOABS 0.8 0.9  --   --   EOSABS 0.3 0.0  --   --   BASOSABS 0.1 0.0  --   --  Recent Labs  Lab 10/03/23 1609 10/05/23 1259 10/05/23 1424 10/06/23 0512  NA 138 138 139 138  K 3.8 3.7 3.6 3.9  CL 102 103 106 105  CO2 23 22  --  21*  GLUCOSE 93 116* 109* 103*  BUN 11 17 16 15   CREATININE 0.92 1.07 1.00 1.10  CALCIUM  9.4 9.1  --  8.5*  AST 16 18  --   --   ALT 14 17  --   --   ALKPHOS 78 52  --   --   BILITOT 0.6 1.0  --   --   ALBUMIN 4.5 3.8  --   --     ------------------------------------------------------------------------------------------------------------------ No results for input(s): CHOL, HDL, LDLCALC, TRIG, CHOLHDL, LDLDIRECT in the last 72 hours.  No results found for: HGBA1C ------------------------------------------------------------------------------------------------------------------ No results for input(s): TSH, T4TOTAL, T3FREE, THYROIDAB in the last 72 hours.  Invalid input(s): FREET3  Cardiac Enzymes No results for input(s): CKMB, TROPONINI, MYOGLOBIN in the last 168 hours.  Invalid input(s): CK ------------------------------------------------------------------------------------------------------------------    Component Value Date/Time   BNP 23.8 04/01/2021 1858    CBG: No results for  input(s): GLUCAP in the last 168 hours.  No results found for this or any previous visit (from the past 240 hours).   Radiology Studies: No results found.   Nilda Fendt, MD, PhD [facility]  Between 7 am - 7 pm I am available, please contact me via Amion (for emergencies) or Securechat (non urgent messages)  Between 7 pm - 7 am I am not available, please contact night coverage MD/APP via Amion

## 2023-10-07 DIAGNOSIS — R112 Nausea with vomiting, unspecified: Secondary | ICD-10-CM | POA: Diagnosis not present

## 2023-10-07 MED ORDER — ZOLPIDEM TARTRATE 5 MG PO TABS
5.0000 mg | ORAL_TABLET | Freq: Once | ORAL | Status: AC
  Administered 2023-10-07: 5 mg via ORAL
  Filled 2023-10-07: qty 1

## 2023-10-07 MED ORDER — METHYLNALTREXONE BROMIDE 12 MG/0.6ML ~~LOC~~ SOLN
12.0000 mg | Freq: Once | SUBCUTANEOUS | Status: AC
  Administered 2023-10-07: 12 mg via SUBCUTANEOUS
  Filled 2023-10-07: qty 0.6

## 2023-10-07 MED ORDER — KETOROLAC TROMETHAMINE 30 MG/ML IJ SOLN
30.0000 mg | Freq: Once | INTRAMUSCULAR | Status: AC
  Administered 2023-10-07: 30 mg via INTRAVENOUS
  Filled 2023-10-07: qty 1

## 2023-10-07 MED ORDER — METHOCARBAMOL 500 MG PO TABS
1000.0000 mg | ORAL_TABLET | Freq: Once | ORAL | Status: AC
  Administered 2023-10-07: 1000 mg via ORAL
  Filled 2023-10-07: qty 2

## 2023-10-07 NOTE — Plan of Care (Signed)

## 2023-10-07 NOTE — Progress Notes (Signed)
 PROGRESS NOTE  Troy French FMW:[id number] DOB: 1960-08-19 DOA: 10/05/2023 PCP: Claudene Prentice [name], FNP   LOS: 0 days   Brief Narrative / Interim history: 63 y.o. male with medical history significant of CAD status post multiple cardiac stent, COPD, OSA not on CPAP, active smoking of cigarette, essential hypertension, hyperlipidemia, chronic pain syndrome, chronic back pain, and insomnia presented to emergency department second visit in 2 days with complaining of left-sided abdominal pain with distention and rigidity and constipation for 1 week.  He also has been experiencing significantly worse lumbar back pain.  Imaging on admission of CT chest, abdomen, pelvis did not show any acute intra-abdominal or pelvic findings  Subjective / 24h Interval events: Remains with rather severe abdominal pain, but now has migrated towards the left side.  Assesement and Plan: Principal problem Intractable nausea, vomiting, severe abdominal pain, constipation -this is likely in the setting of worsening constipation.  Patient has been on narcotic medications for a very long time, yet did not have this problem until now.  CT chest abdomen pelvis are unremarkable - Placed on aggressive bowel regimen but without success, including MiraLAX , Senokot, smog enema with minimal results.  Will administer Relistor today, may need to repeat dose tomorrow if is not working  Active problems Acute on chronic lower back pain-patient reports longstanding lower back pain, following with pain management and he is on chronic narcotics.  Feels like he is pain is significantly worse limiting his mobility, more so in the last few days.  MRI of the L-spine with significant chronic degenerative disease and spinal canal stenosis, but no acute findings  GAD-continue home diazepam   Essential hypertension-continue metoprolol   Hyperlipidemia-continue statin  CAD-primary cardiologist at [facility], he has advanced cardiac disease  with multiple stents in place, most recent cardiac cath September 2023 with 100% occlusion of the mid LCx and mild to moderate CAD of the RCA and LAD.  He had a previously placed mid RCA to distal RCA stent, and most recently a proximal RCA DES placed in 2021. - Currently he is chest pain-free, continue atorvastatin , Plavix , ranolazine   History of COPD, ongoing tobacco use-continue breathing treatments, this is at baseline, no wheezing.  Counseled for cessation  Scheduled Meds:  atorvastatin   40 mg Oral Daily   budesonide -glycopyrrolate -formoterol   2 puff Inhalation BID   clopidogrel   75 mg Oral Daily   enoxaparin  (LOVENOX ) injection  40 mg Subcutaneous Q24H   lidocaine   1 patch Transdermal Q24H   lisinopril   10 mg Oral Daily   metoprolol  succinate  100 mg Oral Daily   montelukast  10 mg Oral q morning   morphine   30 mg Oral Daily   nicotine   21 mg Transdermal Daily   pantoprazole   40 mg Oral Daily   ranolazine   1,000 mg Oral q morning   sodium chloride  flush  3 mL Intravenous Q12H   sodium chloride  flush  3 mL Intravenous Q12H   Continuous Infusions:   PRN Meds:.acetaminophen  **OR** acetaminophen , diazepam , ipratropium-albuterol , ondansetron  **OR** ondansetron  (ZOFRAN ) IV, oxyCODONE -acetaminophen , sodium chloride  flush, sodium phosphate  Current Outpatient Medications  Medication Instructions   albuterol  (VENTOLIN  HFA) 108 (90 Base) MCG/ACT inhaler 2 puffs, Every 6 hours PRN   atorvastatin  (LIPITOR ) 40 mg, Oral, Daily   clopidogrel  (PLAVIX ) 75 mg, Oral, Daily   cyclobenzaprine (FLEXERIL) 10 mg, 3 times daily   diazepam  (VALIUM ) 2 mg, Every 12 hours PRN   famotidine  (PEPCID ) 20 mg, 2 times daily   furosemide (LASIX) 40 mg, Oral,  Daily   lisinopril  (ZESTRIL ) 10 mg, Oral, Daily   metoprolol  succinate (TOPROL -XL) 100 mg, Oral, Daily   montelukast (SINGULAIR) 10 mg, Oral, Every morning   morphine  (MS CONTIN ) 30 mg, Daily   oxyCODONE -acetaminophen  (PERCOCET) 10-325 MG tablet 1  tablet, 5 times daily   pantoprazole  (PROTONIX ) 40 mg, Oral, Daily   ranolazine  (RANEXA ) 1,000 mg, Oral, Every morning   tadalafil (CIALIS) 5 mg, Oral, Daily   tamsulosin (FLOMAX) 0.4 mg, Oral, Every evening   TRELEGY ELLIPTA 200-62.5-25 MCG/ACT AEPB SMARTSIG:2 Puff(s) Via Inhaler Daily   zolpidem  (AMBIEN ) 10 mg, Daily at bedtime    Diet Orders (From admission, onward)     Start     Ordered   10/06/23 1634  Diet full liquid Room service appropriate? Yes; Fluid consistency: Thin  Diet effective now       Question Answer Comment  Room service appropriate? Yes   Fluid consistency: Thin      10/06/23 1633            DVT prophylaxis: enoxaparin  (LOVENOX ) injection 40 mg Start: 10/05/23 2200 SCDs Start: 10/05/23 2106 Place TED hose Start: 10/05/23 2106   Lab Results  Component Value Date   PLT 261 10/06/2023      Code Status: Full Code  Family Communication: no family at bedside   Status is: Observation The patient will require care spanning > 2 midnights and should be moved to inpatient because: Persistent severe abdominal pain, severe constipation   Level of care: Med-Surg  Consultants:  None  Objective: Vitals:   10/06/23 2009 10/07/23 0341 10/07/23 0820 10/07/23 0859  BP:  (!) 141/82  131/76  Pulse:  (!) 50  64  Resp:  18  18  Temp:  97.9 F (36.6 C)  98.1 F (36.7 C)  TempSrc:  Oral    SpO2: 98% 100% 100% 99%    Intake/Output Summary (Last 24 hours) at 10/07/2023 1454 Last data filed at 10/07/2023 1300 Gross per 24 hour  Intake 600 ml  Output --  Net 600 ml   Wt Readings from Last 3 Encounters:  10/03/23 74.8 kg  04/01/21 84.4 kg  11/29/20 84.4 kg    Examination:  Constitutional: NAD Eyes: lids and conjunctivae normal, no scleral icterus ENMT: mmm Neck: normal, supple Respiratory: clear to auscultation bilaterally, no wheezing, no crackles.  Cardiovascular: Regular rate and rhythm, no murmurs / rubs / gallops. No LE edema. Abdomen: soft,  no distention, no tenderness. Bowel sounds positive.   Data Reviewed: I have independently reviewed following labs and imaging studies   CBC Recent Labs  Lab 10/03/23 1609 10/05/23 1259 10/05/23 1424 10/06/23 0512  WBC 9.3 17.4*  --  12.2*  HGB 14.6 13.3 11.9* 12.7*  HCT 41.9 38.5* 35.0* 37.1*  PLT 311 299  --  261  MCV 93.5 92.8  --  93.2  MCH 32.6 32.0  --  31.9  MCHC 34.8 34.5  --  34.2  RDW 11.7 11.7  --  11.9  LYMPHSABS 3.1 2.1  --   --   MONOABS 0.8 0.9  --   --   EOSABS 0.3 0.0  --   --   BASOSABS 0.1 0.0  --   --     Recent Labs  Lab 10/03/23 1609 10/05/23 1259 10/05/23 1424 10/06/23 0512  NA 138 138 139 138  K 3.8 3.7 3.6 3.9  CL 102 103 106 105  CO2 23 22  --  21*  GLUCOSE 93 116* 109*  103*  BUN 11 17 16 15   CREATININE 0.92 1.07 1.00 1.10  CALCIUM  9.4 9.1  --  8.5*  AST 16 18  --   --   ALT 14 17  --   --   ALKPHOS 78 52  --   --   BILITOT 0.6 1.0  --   --   ALBUMIN 4.5 3.8  --   --     ------------------------------------------------------------------------------------------------------------------ No results for input(s): CHOL, HDL, LDLCALC, TRIG, CHOLHDL, LDLDIRECT in the last 72 hours.  No results found for: HGBA1C ------------------------------------------------------------------------------------------------------------------ No results for input(s): TSH, T4TOTAL, T3FREE, THYROIDAB in the last 72 hours.  Invalid input(s): FREET3  Cardiac Enzymes No results for input(s): CKMB, TROPONINI, MYOGLOBIN in the last 168 hours.  Invalid input(s): CK ------------------------------------------------------------------------------------------------------------------    Component Value Date/Time   BNP 23.8 04/01/2021 1858    CBG: No results for input(s): GLUCAP in the last 168 hours.  No results found for this or any previous visit (from the past 240 hours).   Radiology Studies: MR LUMBAR SPINE W WO  CONTRAST Result Date: 10/07/2023 CLINICAL DATA:  Initial evaluation for lumbar radiculopathy, infection suspected. EXAM: MRI LUMBAR SPINE WITHOUT AND WITH CONTRAST TECHNIQUE: Multiplanar and multiecho pulse sequences of the lumbar spine were obtained without and with intravenous contrast. CONTRAST:  8mL GADAVIST GADOBUTROL 1 MMOL/ML IV SOLN COMPARISON:  Prior MRI from 11/05/2019. FINDINGS: Segmentation: Standard. Lowest well-formed disc space labeled the L5-S1 level. Alignment: Straightening of the normal lumbar lordosis. No significant listhesis. Vertebrae: Vertebral body height maintained without acute or chronic fracture. Bone marrow signal intensity somewhat heterogeneous but overall within normal limits. No worrisome osseous lesions. Exuberant marrow edema present about the L4-5 and L5-S1 interspaces, felt to be degenerative in nature. Extension into the right pedicles of L4 and L5. No other evidence for osteomyelitis discitis or septic arthritis. No other abnormal marrow edema or enhancement. Conus medullaris and cauda equina: Conus extends to the L1 level. Conus demonstrates a normal appearance. Proximal nerve roots of the cauda equina are irregular due to stenosis at the L2-3 level. Paraspinal and other soft tissues: Paraspinous soft tissues within normal limits. Few scattered T2 hyperintense cyst noted about the visualized kidneys, benign in appearance, no follow-up imaging recommended. Disc levels: L1-2: Mild degenerative intervertebral disc space narrowing with diffuse disc bulge. Mild bilateral facet spurring. No significant spinal stenosis. Mild left L1 foraminal narrowing. Right neural foramen remains patent. L2-3: Degenerative vertebral disc space narrowing with diffuse disc bulge and disc desiccation. Reactive endplate change with marginal endplate osteophytic spurring. Superimposed left foraminal to extraforaminal disc protrusion (series 8, image 15). Mild bilateral facet hypertrophy. Prominence of  the dorsal epidural fat. Resultant moderate to severe spinal stenosis. Mild to moderate bilateral L2 foraminal narrowing. L3-4: Degenerative intervertebral disc space narrowing with disc desiccation and diffuse disc bulge. Reactive endplate spurring. Mild left greater than right facet and ligament flavum hypertrophy. Mild epidural lipomatosis. Resultant moderate spinal stenosis, largely due to epidural lipomatosis. Mild to moderate bilateral L3 foraminal stenosis. L4-5: Degenerative intervertebral disc space narrowing with disc desiccation and diffuse disc bulge. Reactive endplate change with marginal endplate osteophytic spurring and reactive marrow edema. Moderate facet hypertrophy. Epidural lipomatosis. Resultant severe spinal stenosis. Moderate left with severe right L4 foraminal narrowing. L5-S1: Degenerative intervertebral disc space narrowing with disc desiccation and diffuse disc bulge. Reactive endplate change with endplate osteophytic spurring and marrow edema. Moderate left greater than right facet hypertrophy. Mild epidural lipomatosis. No significant spinal stenosis. Severe left with  moderate right L5 foraminal narrowing. IMPRESSION: 1. No MRI evidence for acute infection within the lumbar spine. 2. Multifactorial degenerative changes at L2-3 with resultant moderate to severe spinal stenosis, with mild to moderate bilateral L2 foraminal narrowing. 3. Multifactorial degenerative changes at L3-4 with resultant moderate spinal stenosis, with mild to moderate bilateral L3 foraminal narrowing. 4. Degenerative disc bulge with facet hypertrophy at L4-5 with resultant severe spinal stenosis, with moderate left and severe right L4 foraminal narrowing. 5. Degenerative disc bulge with facet hypertrophy at L5-S1 with resultant severe left and moderate right L5 foraminal stenosis. Electronically Signed   By: Morene Hoard M.D.   On: 10/07/2023 03:17     Nilda Fendt, MD, PhD [facility]  Between 7 am - 7 pm I am available, please contact me via Amion (for emergencies) or Securechat (non urgent messages)  Between 7 pm - 7 am I am not available, please contact night coverage MD/APP via Amion

## 2023-10-07 NOTE — Progress Notes (Signed)
 Patient shares that no BM after fleet enema and still c/o pain and pressure around lower abdomen. Patient shares that he will try to ambulate more to help. Report to oncoming RN.

## 2023-10-08 ENCOUNTER — Inpatient Hospital Stay (HOSPITAL_COMMUNITY)

## 2023-10-08 ENCOUNTER — Observation Stay (HOSPITAL_COMMUNITY)

## 2023-10-08 DIAGNOSIS — I10 Essential (primary) hypertension: Secondary | ICD-10-CM | POA: Diagnosis present

## 2023-10-08 DIAGNOSIS — R112 Nausea with vomiting, unspecified: Secondary | ICD-10-CM | POA: Diagnosis not present

## 2023-10-08 DIAGNOSIS — Z888 Allergy status to other drugs, medicaments and biological substances status: Secondary | ICD-10-CM | POA: Diagnosis not present

## 2023-10-08 DIAGNOSIS — Z7951 Long term (current) use of inhaled steroids: Secondary | ICD-10-CM | POA: Diagnosis not present

## 2023-10-08 DIAGNOSIS — Z79899 Other long term (current) drug therapy: Secondary | ICD-10-CM | POA: Diagnosis not present

## 2023-10-08 DIAGNOSIS — Z8249 Family history of ischemic heart disease and other diseases of the circulatory system: Secondary | ICD-10-CM | POA: Diagnosis not present

## 2023-10-08 DIAGNOSIS — G894 Chronic pain syndrome: Secondary | ICD-10-CM | POA: Diagnosis present

## 2023-10-08 DIAGNOSIS — E785 Hyperlipidemia, unspecified: Secondary | ICD-10-CM | POA: Diagnosis present

## 2023-10-08 DIAGNOSIS — Z833 Family history of diabetes mellitus: Secondary | ICD-10-CM | POA: Diagnosis not present

## 2023-10-08 DIAGNOSIS — K5732 Diverticulitis of large intestine without perforation or abscess without bleeding: Secondary | ICD-10-CM | POA: Diagnosis present

## 2023-10-08 DIAGNOSIS — J4489 Other specified chronic obstructive pulmonary disease: Secondary | ICD-10-CM | POA: Diagnosis present

## 2023-10-08 DIAGNOSIS — Z716 Tobacco abuse counseling: Secondary | ICD-10-CM | POA: Diagnosis not present

## 2023-10-08 DIAGNOSIS — T402X5A Adverse effect of other opioids, initial encounter: Secondary | ICD-10-CM | POA: Diagnosis present

## 2023-10-08 DIAGNOSIS — R1084 Generalized abdominal pain: Secondary | ICD-10-CM | POA: Diagnosis present

## 2023-10-08 DIAGNOSIS — M48061 Spinal stenosis, lumbar region without neurogenic claudication: Secondary | ICD-10-CM | POA: Diagnosis present

## 2023-10-08 DIAGNOSIS — I2510 Atherosclerotic heart disease of native coronary artery without angina pectoris: Secondary | ICD-10-CM | POA: Diagnosis present

## 2023-10-08 DIAGNOSIS — Z7902 Long term (current) use of antithrombotics/antiplatelets: Secondary | ICD-10-CM | POA: Diagnosis not present

## 2023-10-08 DIAGNOSIS — F39 Unspecified mood [affective] disorder: Secondary | ICD-10-CM | POA: Diagnosis present

## 2023-10-08 DIAGNOSIS — G4733 Obstructive sleep apnea (adult) (pediatric): Secondary | ICD-10-CM | POA: Diagnosis present

## 2023-10-08 DIAGNOSIS — K5903 Drug induced constipation: Secondary | ICD-10-CM | POA: Diagnosis present

## 2023-10-08 DIAGNOSIS — K5792 Diverticulitis of intestine, part unspecified, without perforation or abscess without bleeding: Secondary | ICD-10-CM | POA: Diagnosis not present

## 2023-10-08 DIAGNOSIS — Z955 Presence of coronary angioplasty implant and graft: Secondary | ICD-10-CM | POA: Diagnosis not present

## 2023-10-08 DIAGNOSIS — F17210 Nicotine dependence, cigarettes, uncomplicated: Secondary | ICD-10-CM | POA: Diagnosis present

## 2023-10-08 DIAGNOSIS — Z79891 Long term (current) use of opiate analgesic: Secondary | ICD-10-CM | POA: Diagnosis not present

## 2023-10-08 DIAGNOSIS — G4700 Insomnia, unspecified: Secondary | ICD-10-CM | POA: Diagnosis present

## 2023-10-08 DIAGNOSIS — K5909 Other constipation: Secondary | ICD-10-CM | POA: Diagnosis present

## 2023-10-08 DIAGNOSIS — F411 Generalized anxiety disorder: Secondary | ICD-10-CM | POA: Diagnosis present

## 2023-10-08 DIAGNOSIS — K5649 Other impaction of intestine: Secondary | ICD-10-CM | POA: Diagnosis present

## 2023-10-08 LAB — BASIC METABOLIC PANEL WITH GFR
Anion gap: 10 (ref 5–15)
BUN: 10 mg/dL (ref 8–23)
CO2: 22 mmol/L (ref 22–32)
Calcium: 8.3 mg/dL — ABNORMAL LOW (ref 8.9–10.3)
Chloride: 103 mmol/L (ref 98–111)
Creatinine, Ser: 1.16 mg/dL (ref 0.61–1.24)
GFR, Estimated: >60 mL/min (ref >60)
Glucose, Bld: 112 mg/dL — ABNORMAL HIGH (ref 70–99)
Potassium: 4 mmol/L (ref 3.5–5.1)
Sodium: 135 mmol/L (ref 135–145)

## 2023-10-08 MED ORDER — ZOLPIDEM TARTRATE 5 MG PO TABS: 5.0000 mg | ORAL_TABLET | Freq: Every evening | ORAL | Status: DC | PRN

## 2023-10-08 MED ORDER — ZOLPIDEM TARTRATE 5 MG PO TABS
10.0000 mg | ORAL_TABLET | Freq: Every evening | ORAL | Status: AC | PRN
  Administered 2023-10-08 – 2023-10-09 (×2): 10 mg via ORAL
  Filled 2023-10-08 (×3): qty 2

## 2023-10-08 MED ORDER — MORPHINE SULFATE (PF) 2 MG/ML IV SOLN
2.0000 mg | INTRAVENOUS | Status: AC
  Administered 2023-10-08: 2 mg via INTRAVENOUS
  Filled 2023-10-08: qty 1

## 2023-10-08 MED ORDER — IOHEXOL 350 MG/ML SOLN
75.0000 mL | Freq: Once | INTRAVENOUS | Status: AC | PRN
Start: 2023-10-08 — End: 2023-10-08
  Administered 2023-10-08: 75 mL via INTRAVENOUS

## 2023-10-08 MED ORDER — METHYLNALTREXONE BROMIDE 12 MG/0.6ML ~~LOC~~ SOLN
12.0000 mg | Freq: Once | SUBCUTANEOUS | Status: AC
  Administered 2023-10-08: 12 mg via SUBCUTANEOUS
  Filled 2023-10-08: qty 0.6

## 2023-10-08 MED ORDER — OXYCODONE-ACETAMINOPHEN 7.5-325 MG PO TABS
2.0000 | ORAL_TABLET | ORAL | Status: DC | PRN
  Administered 2023-10-08 – 2023-10-12 (×24): 2 via ORAL
  Filled 2023-10-08 (×24): qty 2

## 2023-10-08 NOTE — Plan of Care (Signed)

## 2023-10-08 NOTE — Progress Notes (Signed)
 Mobility Specialist Progress Note:   10/08/23 1156  Mobility  Activity Ambulated with assistance (In hallway)  Level of Assistance Modified independent, requires aide device or extra time  Assistive Device None  Distance Ambulated (ft) 200 ft  Activity Response Tolerated well  Mobility Referral Yes  Mobility visit 1 Mobility  Mobility Specialist Start Time (ACUTE ONLY) 1145  Mobility Specialist Stop Time (ACUTE ONLY) 1155  Mobility Specialist Time Calculation (min) (ACUTE ONLY) 10 min   Received pt in bed and agreeable to mobility. No physical assistance required. C/o abdominal pain, otherwise tolerated well. Returned to room without fault. Left pt in bed. Personal belongings and call light within reach. All needs met.  Troy French Mobility Specialist  Please contact via [employer] or  Rehab Office [phone]

## 2023-10-08 NOTE — Progress Notes (Signed)
 PROGRESS NOTE  Troy French FMW:[id number] DOB: [birth year] DOA: 10/05/2023 PCP: Claudene Prentice [name], FNP   LOS: 0 days   Brief Narrative / Interim history: 63 y.o. male with medical history significant of CAD status post multiple cardiac stent, COPD, OSA not on CPAP, active smoking of cigarette, essential hypertension, hyperlipidemia, chronic pain syndrome, chronic back pain, and insomnia presented to emergency department second visit in 2 days with complaining of left-sided abdominal pain with distention and rigidity and constipation for 1 week.  He also has been experiencing significantly worse lumbar back pain.  Imaging on admission of CT chest, abdomen, pelvis did not show any acute intra-abdominal or pelvic findings  Subjective / 24h Interval events: Continues to complain of left-sided abdominal pain.  Assesement and Plan: Principal problem Intractable nausea, vomiting, severe abdominal pain, constipation/impaction-this is likely in the setting of worsening constipation.  Patient has been on narcotic medications for a very long time, yet did not have this problem until now.  CT chest abdomen pelvis are unremarkable - Placed on aggressive bowel regimen but without success, including MiraLAX , Senokot, smog enema with minimal results.  He received Relistor 9/28, little to no success, repeat Relistor today 9/29  Active problems Acute on chronic lower back pain-patient reports longstanding lower back pain, following with pain management and he is on chronic narcotics.  Feels like he is pain is significantly worse limiting his mobility, more so in the last few days.  MRI of the L-spine with significant chronic degenerative disease and spinal canal stenosis, but no acute findings  Left inguinal and scrotal pain-new onset today, scrotum appears red, not swollen but tender.  Obtain scrotal ultrasound, and also evaluate inguinal area with a CT of the pelvis  GAD-continue home diazepam   Essential  hypertension-continue metoprolol   Hyperlipidemia-continue statin  CAD-primary cardiologist at [facility], he has advanced cardiac disease with multiple stents in place, most recent cardiac cath September 2023 with 100% occlusion of the mid LCx and mild to moderate CAD of the RCA and LAD.  He had a previously placed mid RCA to distal RCA stent, and most recently a proximal RCA DES placed in 2021. - Currently he is chest pain-free, continue atorvastatin , Plavix , ranolazine   History of COPD, ongoing tobacco use-continue breathing treatments, this is at baseline, no wheezing.  Counseled for cessation  Scheduled Meds:  atorvastatin   40 mg Oral Daily   budesonide -glycopyrrolate -formoterol   2 puff Inhalation BID   clopidogrel   75 mg Oral Daily   enoxaparin  (LOVENOX ) injection  40 mg Subcutaneous Q24H   lidocaine   1 patch Transdermal Q24H   lisinopril   10 mg Oral Daily   metoprolol  succinate  100 mg Oral Daily   montelukast  10 mg Oral q morning   morphine   30 mg Oral Daily   nicotine   21 mg Transdermal Daily   pantoprazole   40 mg Oral Daily   ranolazine   1,000 mg Oral q morning   sodium chloride  flush  3 mL Intravenous Q12H   sodium chloride  flush  3 mL Intravenous Q12H   Continuous Infusions:   PRN Meds:.acetaminophen  **OR** acetaminophen , diazepam , ipratropium-albuterol , ondansetron  **OR** ondansetron  (ZOFRAN ) IV, oxyCODONE -acetaminophen , sodium chloride  flush, sodium phosphate  Current Outpatient Medications  Medication Instructions   albuterol  (VENTOLIN  HFA) 108 (90 Base) MCG/ACT inhaler 2 puffs, Every 6 hours PRN   atorvastatin  (LIPITOR ) 40 mg, Oral, Daily   clopidogrel  (PLAVIX ) 75 mg, Oral, Daily   cyclobenzaprine (FLEXERIL) 10 mg, 3 times daily   diazepam  (VALIUM ) 2 mg,  Every 12 hours PRN   famotidine  (PEPCID ) 20 mg, 2 times daily   furosemide (LASIX) 40 mg, Oral, Daily   lisinopril  (ZESTRIL ) 10 mg, Oral, Daily   metoprolol  succinate (TOPROL -XL) 100 mg, Oral, Daily    montelukast (SINGULAIR) 10 mg, Oral, Every morning   morphine  (MS CONTIN ) 30 mg, Daily   oxyCODONE -acetaminophen  (PERCOCET) 10-325 MG tablet 1 tablet, 5 times daily   pantoprazole  (PROTONIX ) 40 mg, Oral, Daily   ranolazine  (RANEXA ) 1,000 mg, Oral, Every morning   tadalafil (CIALIS) 5 mg, Oral, Daily   tamsulosin (FLOMAX) 0.4 mg, Oral, Every evening   TRELEGY ELLIPTA 200-62.5-25 MCG/ACT AEPB SMARTSIG:2 Puff(s) Via Inhaler Daily   zolpidem  (AMBIEN ) 10 mg, Daily at bedtime    Diet Orders (From admission, onward)     Start     Ordered   10/07/23 1455  Diet regular Fluid consistency: Thin  Diet effective now       Question:  Fluid consistency:  Answer:  Thin   10/07/23 1455            DVT prophylaxis: enoxaparin  (LOVENOX ) injection 40 mg Start: 10/05/23 2200 SCDs Start: 10/05/23 2106 Place TED hose Start: 10/05/23 2106   Lab Results  Component Value Date   PLT 261 10/06/2023      Code Status: Full Code  Family Communication: no family at bedside   Status is: Inpatient   Level of care: Med-Surg  Consultants:  None  Objective: Vitals:   10/08/23 0354 10/08/23 0812 10/08/23 0858 10/08/23 0908  BP: 131/72  (!) 160/75 (!) 160/75  Pulse:  62 (!) 52 60  Resp:  16 18   Temp: 97.7 F (36.5 C)  98.4 F (36.9 C)   TempSrc: Oral  Oral   SpO2: 100% 99% 100%     Intake/Output Summary (Last 24 hours) at 10/08/2023 1555 Last data filed at 10/08/2023 1010 Gross per 24 hour  Intake 610 ml  Output --  Net 610 ml   Wt Readings from Last 3 Encounters:  10/03/23 74.8 kg  04/01/21 84.4 kg  11/29/20 84.4 kg    Examination:  Constitutional: NAD Eyes: lids and conjunctivae normal, no scleral icterus ENMT: mmm Neck: normal, supple Respiratory: clear to auscultation bilaterally, no wheezing, no crackles.  Cardiovascular: Regular rate and rhythm, no murmurs / rubs / gallops. No LE edema. Abdomen: soft, tender to palpation in the left lower quadrant, tender left inguinal  area  Data Reviewed: I have independently reviewed following labs and imaging studies   CBC Recent Labs  Lab 10/03/23 1609 10/05/23 1259 10/05/23 1424 10/06/23 0512  WBC 9.3 17.4*  --  12.2*  HGB 14.6 13.3 11.9* 12.7*  HCT 41.9 38.5* 35.0* 37.1*  PLT 311 299  --  261  MCV 93.5 92.8  --  93.2  MCH 32.6 32.0  --  31.9  MCHC 34.8 34.5  --  34.2  RDW 11.7 11.7  --  11.9  LYMPHSABS 3.1 2.1  --   --   MONOABS 0.8 0.9  --   --   EOSABS 0.3 0.0  --   --   BASOSABS 0.1 0.0  --   --     Recent Labs  Lab 10/03/23 1609 10/05/23 1259 10/05/23 1424 10/06/23 0512  NA 138 138 139 138  K 3.8 3.7 3.6 3.9  CL 102 103 106 105  CO2 23 22  --  21*  GLUCOSE 93 116* 109* 103*  BUN 11 17 16 15   CREATININE 0.92  1.07 1.00 1.10  CALCIUM  9.4 9.1  --  8.5*  AST 16 18  --   --   ALT 14 17  --   --   ALKPHOS 78 52  --   --   BILITOT 0.6 1.0  --   --   ALBUMIN 4.5 3.8  --   --     ------------------------------------------------------------------------------------------------------------------ No results for input(s): CHOL, HDL, LDLCALC, TRIG, CHOLHDL, LDLDIRECT in the last 72 hours.  No results found for: HGBA1C ------------------------------------------------------------------------------------------------------------------ No results for input(s): TSH, T4TOTAL, T3FREE, THYROIDAB in the last 72 hours.  Invalid input(s): FREET3  Cardiac Enzymes No results for input(s): CKMB, TROPONINI, MYOGLOBIN in the last 168 hours.  Invalid input(s): CK ------------------------------------------------------------------------------------------------------------------    Component Value Date/Time   BNP 23.8 04/01/2021 1858    CBG: No results for input(s): GLUCAP in the last 168 hours.  No results found for this or any previous visit (from the past 240 hours).   Radiology Studies: DG Abd Portable 1V Result Date: 10/08/2023 CLINICAL DATA:  Abdominal pain. EXAM:  PORTABLE ABDOMEN - 1 VIEW COMPARISON:  None Available. FINDINGS: Mild colonic stool burden. No bowel dilatation or evidence of obstruction. No free air or radiopaque calculi. Degenerative changes spine. No acute osseous pathology. IMPRESSION: Nonobstructive bowel gas pattern. Electronically Signed   By: Vanetta Chou M.D.   On: 10/08/2023 13:53     Nilda Fendt, MD, PhD [facility]  Between 7 am - 7 pm I am available, please contact me via Amion (for emergencies) or Securechat (non urgent messages)  Between 7 pm - 7 am I am not available, please contact night coverage MD/APP via Amion

## 2023-10-08 NOTE — Plan of Care (Signed)

## 2023-10-09 DIAGNOSIS — R112 Nausea with vomiting, unspecified: Secondary | ICD-10-CM | POA: Diagnosis not present

## 2023-10-09 LAB — COMPREHENSIVE METABOLIC PANEL WITH GFR
ALT: 14 U/L (ref 0–44)
AST: 10 U/L — ABNORMAL LOW (ref 15–41)
Albumin: 2.9 g/dL — ABNORMAL LOW (ref 3.5–5.0)
Alkaline Phosphatase: 43 U/L (ref 38–126)
Anion gap: 12 (ref 5–15)
BUN: 8 mg/dL (ref 8–23)
CO2: 22 mmol/L (ref 22–32)
Calcium: 8.1 mg/dL — ABNORMAL LOW (ref 8.9–10.3)
Chloride: 100 mmol/L (ref 98–111)
Creatinine, Ser: 1.03 mg/dL (ref 0.61–1.24)
GFR, Estimated: >60 mL/min (ref >60)
Glucose, Bld: 115 mg/dL — ABNORMAL HIGH (ref 70–99)
Potassium: 3.9 mmol/L (ref 3.5–5.1)
Sodium: 134 mmol/L — ABNORMAL LOW (ref 135–145)
Total Bilirubin: 1 mg/dL (ref 0.0–1.2)
Total Protein: 5.3 g/dL — ABNORMAL LOW (ref 6.5–8.1)

## 2023-10-09 LAB — CBC
HCT: 34.9 % — ABNORMAL LOW (ref 39.0–52.0)
Hemoglobin: 12.2 g/dL — ABNORMAL LOW (ref 13.0–17.0)
MCH: 32.4 pg (ref 26.0–34.0)
MCHC: 35 g/dL (ref 30.0–36.0)
MCV: 92.8 fL (ref 80.0–100.0)
Platelets: 231 K/uL (ref 150–400)
RBC: 3.76 MIL/uL — ABNORMAL LOW (ref 4.22–5.81)
RDW: 11.6 % (ref 11.5–15.5)
WBC: 9.2 K/uL (ref 4.0–10.5)
nRBC: 0 % (ref 0.0–0.2)

## 2023-10-09 LAB — MAGNESIUM: Magnesium: 1.9 mg/dL (ref 1.7–2.4)

## 2023-10-09 MED ORDER — PIPERACILLIN-TAZOBACTAM 3.375 G IVPB
3.3750 g | Freq: Three times a day (TID) | INTRAVENOUS | Status: DC
  Administered 2023-10-09 – 2023-10-12 (×10): 3.375 g via INTRAVENOUS
  Filled 2023-10-09 (×10): qty 50

## 2023-10-09 NOTE — Plan of Care (Signed)
   Problem: Education: Goal: Knowledge of General Education information will improve Description: Including pain rating scale, medication(s)/side effects and non-pharmacologic comfort measures Outcome: Progressing   Problem: Activity: Goal: Risk for activity intolerance will decrease Outcome: Progressing   Problem: Nutrition: Goal: Adequate nutrition will be maintained Outcome: Progressing

## 2023-10-09 NOTE — Progress Notes (Signed)
 Pharmacy Antibiotic Note  Troy French is a 63 y.o. male admitted on 10/05/2023 with IAI.  Pharmacy has been consulted for zosyn dosing.  Plan: Zosyn 3.375g IV q8h (4 hour infusion).     Temp (24hrs), Avg:98.2 F (36.8 C), Min:98.1 F (36.7 C), Max:98.3 F (36.8 C)  Recent Labs  Lab 10/03/23 1609 10/05/23 1259 10/05/23 1424 10/06/23 0512 10/08/23 2132 10/09/23 0149  WBC 9.3 17.4*  --  12.2*  --  9.2  CREATININE 0.92 1.07 1.00 1.10 1.16 1.03    Estimated Creatinine Clearance: 67.7 mL/min (by C-G formula based on SCr of 1.03 mg/dL).    Allergies  Allergen Reactions   Nitroglycerin Nausea And Vomiting    SWEATING   Isosorbide Dinitrate Nausea And Vomiting     Thank you for allowing pharmacy to be a part of this patient's care.  Benedetta Heath BS, PharmD, BCPS Clinical Pharmacist 10/09/2023 9:31 AM  Contact: [phone] after 3 PM

## 2023-10-09 NOTE — Progress Notes (Signed)
 PROGRESS NOTE  Tory Septer FMW:[id number] DOB: Jan 14, 1960 DOA: 10/05/2023 PCP: Claudene Prentice [name], FNP   LOS: 1 day   Brief Narrative / Interim history: 63 y.o. male with medical history significant of CAD status post multiple cardiac stent, COPD, OSA not on CPAP, active smoking of cigarette, essential hypertension, hyperlipidemia, chronic pain syndrome, chronic back pain, and insomnia presented to emergency department second visit in 2 days with complaining of left-sided abdominal pain with distention and rigidity and constipation for 1 week.  He also has been experiencing significantly worse lumbar back pain.  Imaging on admission of CT chest, abdomen, pelvis did not show any acute intra-abdominal or pelvic findings  Subjective / 24h Interval events: Complains of significant left-sided abdominal pain.  No nausea or vomiting.  Does not feel like the pain is improved  Assesement and Plan: Principal problem Intractable nausea, vomiting, severe abdominal pain, constipation/impaction-this is likely in the setting of worsening constipation.  Patient has been on narcotic medications for a very long time, yet did not have this problem until now.  CT chest abdomen pelvis are unremarkable on admission, he was placed on aggressive bowel regimen with MiraLAX , Senokot, smog enema with minimal results.  He received Relistor 9/28 and 9/29, now has been having multiple bowel movements and his severe constipation seems to be much better  Active problems Acute diverticulitis-patient came into the hospital with an elevated white count, however CT scan of the abdomen and pelvis was negative for acute findings.  Due to persistent pain following bowel purge with Relistor, he underwent a limited pelvis CT last night which now revealed acute diverticulitis.  It is possible that it may have been really early on the initial CT scan and it was not picked up.  Due to significant pain we will start IV antibiotics for now,  hopefully we can convert to p.o. in 1 to 2 days if pain gets better.  Downgrade diet to full liquids  Acute on chronic lower back pain-patient reports longstanding lower back pain, following with pain management and he is on chronic narcotics.  Feels like he is pain is significantly worse limiting his mobility, more so in the last few days.  MRI of the L-spine with significant chronic degenerative disease and spinal canal stenosis, but no acute findings  Left inguinal and scrotal pain-new onset today, scrotum appears red, not swollen but tender.  Scrotal ultrasound unremarkable, CT of the pelvis with very small inguinal fat hernias, no worrisome findings  GAD-continue home diazepam   Essential hypertension-continue metoprolol   Hyperlipidemia-continue statin  CAD-primary [facility]ologist at [facility], he has advanced cardiac disease with multiple stents in place, most recent cardiac cath September 2023 with 100% occlusion of the mid LCx and mild to moderate CAD of the RCA and LAD.  He had a previously placed mid RCA to distal RCA stent, and most recently a proximal RCA DES placed in 2021. - Currently he is chest pain-free, continue atorvastatin , Plavix , ranolazine   History of COPD, ongoing tobacco use-continue breathing treatments, this is at baseline, no wheezing.  Counseled for cessation  Scheduled Meds:  atorvastatin   40 mg Oral Daily   budesonide -glycopyrrolate -formoterol   2 puff Inhalation BID   clopidogrel   75 mg Oral Daily   enoxaparin  (LOVENOX ) injection  40 mg Subcutaneous Q24H   lidocaine   1 patch Transdermal Q24H   lisinopril   10 mg Oral Daily   metoprolol  succinate  100 mg Oral Daily   montelukast  10 mg Oral q morning  morphine   30 mg Oral Daily   nicotine   21 mg Transdermal Daily   pantoprazole   40 mg Oral Daily   ranolazine   1,000 mg Oral q morning   sodium chloride  flush  3 mL Intravenous Q12H   sodium chloride  flush  3 mL Intravenous Q12H   Continuous  Infusions:   PRN Meds:.acetaminophen  **OR** acetaminophen , diazepam , ipratropium-albuterol , ondansetron  **OR** ondansetron  (ZOFRAN ) IV, oxyCODONE -acetaminophen , sodium chloride  flush, sodium phosphate, zolpidem   Current Outpatient Medications  Medication Instructions   albuterol  (VENTOLIN  HFA) 108 (90 Base) MCG/ACT inhaler 2 puffs, Every 6 hours PRN   atorvastatin  (LIPITOR ) 40 mg, Oral, Daily   clopidogrel  (PLAVIX ) 75 mg, Oral, Daily   cyclobenzaprine (FLEXERIL) 10 mg, 3 times daily   diazepam  (VALIUM ) 2 mg, Every 12 hours PRN   famotidine  (PEPCID ) 20 mg, 2 times daily   furosemide (LASIX) 40 mg, Oral, Daily   lisinopril  (ZESTRIL ) 10 mg, Oral, Daily   metoprolol  succinate (TOPROL -XL) 100 mg, Oral, Daily   montelukast (SINGULAIR) 10 mg, Oral, Every morning   morphine  (MS CONTIN ) 30 mg, Daily   oxyCODONE -acetaminophen  (PERCOCET) 10-325 MG tablet 1 tablet, 5 times daily   pantoprazole  (PROTONIX ) 40 mg, Oral, Daily   ranolazine  (RANEXA ) 1,000 mg, Oral, Every morning   tadalafil (CIALIS) 5 mg, Oral, Daily   tamsulosin (FLOMAX) 0.4 mg, Oral, Every evening   TRELEGY ELLIPTA 200-62.5-25 MCG/ACT AEPB SMARTSIG:2 Puff(s) Via Inhaler Daily   zolpidem  (AMBIEN ) 10 mg, Daily at bedtime    Diet Orders (From admission, onward)     Start     Ordered   10/07/23 1455  Diet regular Fluid consistency: Thin  Diet effective now       Question:  Fluid consistency:  Answer:  Thin   10/07/23 1455            DVT prophylaxis: enoxaparin  (LOVENOX ) injection 40 mg Start: 10/05/23 2200 SCDs Start: 10/05/23 2106 Place TED hose Start: 10/05/23 2106   Lab Results  Component Value Date   PLT 231 10/09/2023      Code Status: Full Code  Family Communication: no family at bedside   Status is: Inpatient   Level of care: Med-Surg  Consultants:  None  Objective: Vitals:   10/08/23 1558 10/08/23 2230 10/09/23 0439 10/09/23 0800  BP: (!) 157/78 (!) 144/81 (!) 164/82 (!) 159/78  Pulse: 72 66 (!)  51 61  Resp: 18 18 19 18   Temp: 98.3 F (36.8 C) 98.1 F (36.7 C) 98.1 F (36.7 C) 98.1 F (36.7 C)  TempSrc: Oral Oral Oral Oral  SpO2: 97% 100% 99% 98%    Intake/Output Summary (Last 24 hours) at 10/09/2023 9095 Last data filed at 10/09/2023 0015 Gross per 24 hour  Intake 730 ml  Output --  Net 730 ml   Wt Readings from Last 3 Encounters:  10/03/23 74.8 kg  04/01/21 84.4 kg  11/29/20 84.4 kg    Examination:  Constitutional: NAD Eyes: lids and conjunctivae normal, no scleral icterus ENMT: mmm Neck: normal, supple Respiratory: clear to auscultation bilaterally, no wheezing, no crackles.  Cardiovascular: Regular rate and rhythm, no murmurs / rubs / gallops. No LE edema. Abdomen: soft, no distention, no tenderness. Bowel sounds positive.   Data Reviewed: I have independently reviewed following labs and imaging studies   CBC Recent Labs  Lab 10/03/23 1609 10/05/23 1259 10/05/23 1424 10/06/23 0512 10/09/23 0149  WBC 9.3 17.4*  --  12.2* 9.2  HGB 14.6 13.3 11.9* 12.7* 12.2*  HCT 41.9 38.5* 35.0*  37.1* 34.9*  PLT 311 299  --  261 231  MCV 93.5 92.8  --  93.2 92.8  MCH 32.6 32.0  --  31.9 32.4  MCHC 34.8 34.5  --  34.2 35.0  RDW 11.7 11.7  --  11.9 11.6  LYMPHSABS 3.1 2.1  --   --   --   MONOABS 0.8 0.9  --   --   --   EOSABS 0.3 0.0  --   --   --   BASOSABS 0.1 0.0  --   --   --     Recent Labs  Lab 10/03/23 1609 10/05/23 1259 10/05/23 1424 10/06/23 0512 10/08/23 2132 10/09/23 0149  NA 138 138 139 138 135 134*  K 3.8 3.7 3.6 3.9 4.0 3.9  CL 102 103 106 105 103 100  CO2 23 22  --  21* 22 22  GLUCOSE 93 116* 109* 103* 112* 115*  BUN 11 17 16 15 10 8   CREATININE 0.92 1.07 1.00 1.10 1.16 1.03  CALCIUM  9.4 9.1  --  8.5* 8.3* 8.1*  AST 16 18  --   --   --  10*  ALT 14 17  --   --   --  14  ALKPHOS 78 52  --   --   --  43  BILITOT 0.6 1.0  --   --   --  1.0  ALBUMIN 4.5 3.8  --   --   --  2.9*  MG  --   --   --   --   --  1.9     ------------------------------------------------------------------------------------------------------------------ No results for input(s): CHOL, HDL, LDLCALC, TRIG, CHOLHDL, LDLDIRECT in the last 72 hours.  No results found for: HGBA1C ------------------------------------------------------------------------------------------------------------------ No results for input(s): TSH, T4TOTAL, T3FREE, THYROIDAB in the last 72 hours.  Invalid input(s): FREET3  Cardiac Enzymes No results for input(s): CKMB, TROPONINI, MYOGLOBIN in the last 168 hours.  Invalid input(s): CK ------------------------------------------------------------------------------------------------------------------    Component Value Date/Time   BNP 23.8 04/01/2021 1858    CBG: No results for input(s): GLUCAP in the last 168 hours.  No results found for this or any previous visit (from the past 240 hours).   Radiology Studies: CT PELVIS W CONTRAST Result Date: 10/09/2023 CLINICAL DATA:  New onset inguinal and scrotal pain with no acute findings on scrotal ultrasound today. EXAM: CT PELVIS WITH CONTRAST TECHNIQUE: Multidetector CT imaging of the pelvis was performed using the standard protocol following the bolus administration of intravenous contrast. RADIATION DOSE REDUCTION: This exam was performed according to the departmental dose-optimization program which includes automated exposure control, adjustment of the mA and/or kV according to patient size and/or use of iterative reconstruction technique. CONTRAST:  75mL OMNIPAQUE  IOHEXOL  350 MG/ML SOLN COMPARISON:  Recent chest, abdomen and pelvis CT without contrast from 10/05/2023. FINDINGS: Urinary Tract: The pelvic ureters are small in caliber without distal ureteral stones. Bladder appears unremarkable for its degree of distention. Bowel: Diverticulosis of the distal descending and sigmoid colon is again noted with interval new  demonstration of inflammatory stranding around the visualized distal descending colon, which could be from diverticulitis or colitis. The sigmoid diverticula are uncomplicated. The appendix is not in the plane of current imaging. The pelvic bowel is otherwise unremarkable. Vascular/Lymphatic: There is moderate aortoiliac atherosclerosis. No other significant vascular findings. No adenopathy is seen. Reproductive: Enlarged prostate, 4.8 cm transverse, mildly heterogeneous. Correlate with PSA levels. Other: No free fluid, free hemorrhage, free air or abscess. No  incarcerated hernia. There are small inguinal fat hernias. Musculoskeletal: Advanced degenerative change lower lumbar spine. Acquired spinal stenosis L3-4 and L4-5. Acquired foraminal stenosis again noted L3-4 down. No acute or other significant osseous findings. IMPRESSION: 1. Diverticulosis of the distal descending and sigmoid colon with interval new demonstration of inflammatory stranding around the visualized distal descending colon, which could be from diverticulitis or colitis. 2. No free fluid, free air or abscess. 3. Aortic atherosclerosis. 4. Prostatomegaly with prostatic heterogeneity. Correlate with PSA levels. 5. Small inguinal fat hernias. 6. Advanced degenerative change lower lumbar spine with acquired spinal and foraminal stenosis. Aortic Atherosclerosis (ICD10-I70.0). Electronically Signed   By: Francis Quam M.D.   On: 10/09/2023 00:24   US  SCROTUM W/DOPPLER Result Date: 10/08/2023 CLINICAL DATA:  Testicular swelling. EXAM: SCROTAL ULTRASOUND DOPPLER ULTRASOUND OF THE TESTICLES TECHNIQUE: Complete ultrasound examination of the testicles, epididymis, and other scrotal structures was performed. Color and spectral Doppler ultrasound were also utilized to evaluate blood flow to the testicles. COMPARISON:  None Available. FINDINGS: Right testicle Measurements: 4.6 x 2.3 x 2.2 cm. No mass or microlithiasis visualized. Left testicle Measurements:  4.4 x 2.0 x 2.8 cm. No mass or microlithiasis visualized. Right epididymis:  Normal in size and appearance. Left epididymis:  Normal in size and appearance. Hydrocele: Small bilateral hydroceles with low-level echogenic debris. Varicocele:  None visualized. Pulsed Doppler interrogation of both testes demonstrates normal low resistance arterial and venous waveforms bilaterally. IMPRESSION: 1. Unremarkable testicles. 2. Small minimally complex bilateral hydroceles. Electronically Signed   By: Vanetta Chou M.D.   On: 10/08/2023 18:56   DG Abd Portable 1V Result Date: 10/08/2023 CLINICAL DATA:  Abdominal pain. EXAM: PORTABLE ABDOMEN - 1 VIEW COMPARISON:  None Available. FINDINGS: Mild colonic stool burden. No bowel dilatation or evidence of obstruction. No free air or radiopaque calculi. Degenerative changes spine. No acute osseous pathology. IMPRESSION: Nonobstructive bowel gas pattern. Electronically Signed   By: Vanetta Chou M.D.   On: 10/08/2023 13:53     Nilda Fendt, MD, PhD [facility]  Between 7 am - 7 pm I am available, please contact me via Amion (for emergencies) or Securechat (non urgent messages)  Between 7 pm - 7 am I am not available, please contact night coverage MD/APP via Amion

## 2023-10-10 ENCOUNTER — Other Ambulatory Visit: Payer: Self-pay

## 2023-10-10 DIAGNOSIS — R112 Nausea with vomiting, unspecified: Secondary | ICD-10-CM | POA: Diagnosis not present

## 2023-10-10 MED ORDER — ZOLPIDEM TARTRATE 5 MG PO TABS
10.0000 mg | ORAL_TABLET | Freq: Every evening | ORAL | Status: DC | PRN
Start: 2023-10-10 — End: 2023-10-12
  Administered 2023-10-10 – 2023-10-11 (×2): 10 mg via ORAL
  Filled 2023-10-10: qty 2

## 2023-10-10 NOTE — Progress Notes (Signed)
  Progress Note   Patient: Troy French FMW:[id number] DOB: 05-01-1960 DOA: 10/05/2023     2 DOS: the patient was seen and examined on 10/10/2023 at 9:53 AM      Brief hospital course: 63 y.o. M with CAD, COPD, OSA, HTN, smoking and chronic pain syndrome who presented with left flank pain.     Assessment and Plan: Acute sigmoid diverticulitis -Continue Zosyn  Acute on chronic low back pain MRI shows old degenerative changes, LE strength limited by pain. -Continue home Percocet and MS Contin   Coronary disease Hyperlipidemia Hypertension -Continue Lipitor , lisinopril , metoprolol , ranolazine , Plavix   Smoking COPD No evidence of flare - Continue home Breztri , Singulair - Continue nicotine  supplement  OSA Not on CPAP  Mood disorder -Continue home Valium        Subjective: Unchanged left flank pain, lower abdominal pain relieved with supporting his groin.  No fever, no vomiting.  Unable to stand up yesterday.  No swelling of the scrotum.     Physical Exam: BP 119/76 (BP Location: Left Arm)   Pulse 70   Temp 98.2 F (36.8 C) (Oral)   Resp 18   SpO2 100%   Adult male, lying in bed, interactive and appropriate RRR, no murmurs, no peripheral edema Respiratory rate normal, lungs clear without rales or wheezes Abdomen soft without rigidity or guarding in all quadrants.  He has no rebound.  In the left suprapubic area, he has tenderness to deep palpation. Attention normal, affect normal, judgment and insight appear normal.  Lower extremity strength 5 -/5 bilaterally, limited somewhat by pain in the left leg.  Sensation intact to light touch    Data Reviewed: Basic metabolic panel and CBC yesterday unremarkable     Family Communication:     Disposition: Status is: Inpatient         Author: Lonni SHAUNNA Dalton, MD 10/10/2023 5:19 PM  For on call review [email or web address].

## 2023-10-10 NOTE — Plan of Care (Signed)

## 2023-10-10 NOTE — Plan of Care (Signed)

## 2023-10-11 DIAGNOSIS — K5792 Diverticulitis of intestine, part unspecified, without perforation or abscess without bleeding: Secondary | ICD-10-CM

## 2023-10-11 LAB — COMPREHENSIVE METABOLIC PANEL WITH GFR
ALT: 23 U/L (ref 0–44)
AST: 22 U/L (ref 15–41)
Albumin: 3.3 g/dL — ABNORMAL LOW (ref 3.5–5.0)
Alkaline Phosphatase: 45 U/L (ref 38–126)
Anion gap: 9 (ref 5–15)
BUN: 9 mg/dL (ref 8–23)
CO2: 22 mmol/L (ref 22–32)
Calcium: 8.5 mg/dL — ABNORMAL LOW (ref 8.9–10.3)
Chloride: 104 mmol/L (ref 98–111)
Creatinine, Ser: 1.09 mg/dL (ref 0.61–1.24)
GFR, Estimated: >60 mL/min (ref >60)
Glucose, Bld: 107 mg/dL — ABNORMAL HIGH (ref 70–99)
Potassium: 4.4 mmol/L (ref 3.5–5.1)
Sodium: 135 mmol/L (ref 135–145)
Total Bilirubin: 1.5 mg/dL — ABNORMAL HIGH (ref 0.0–1.2)
Total Protein: 5.8 g/dL — ABNORMAL LOW (ref 6.5–8.1)

## 2023-10-11 LAB — CBC
HCT: 40.3 % (ref 39.0–52.0)
Hemoglobin: 13.9 g/dL (ref 13.0–17.0)
MCH: 32.2 pg (ref 26.0–34.0)
MCHC: 34.5 g/dL (ref 30.0–36.0)
MCV: 93.3 fL (ref 80.0–100.0)
Platelets: 254 K/uL (ref 150–400)
RBC: 4.32 MIL/uL (ref 4.22–5.81)
RDW: 11.8 % (ref 11.5–15.5)
WBC: 10.2 K/uL (ref 4.0–10.5)
nRBC: 0 % (ref 0.0–0.2)

## 2023-10-11 MED ORDER — TAMSULOSIN HCL 0.4 MG PO CAPS
0.4000 mg | ORAL_CAPSULE | Freq: Every evening | ORAL | Status: DC
  Administered 2023-10-11 – 2023-10-12 (×2): 0.4 mg via ORAL
  Filled 2023-10-11 (×2): qty 1

## 2023-10-11 MED ORDER — TADALAFIL 5 MG PO TABS
5.0000 mg | ORAL_TABLET | Freq: Every day | ORAL | Status: DC
Start: 2023-10-11 — End: 2023-10-12
  Administered 2023-10-11 – 2023-10-12 (×2): 5 mg via ORAL
  Filled 2023-10-11 (×2): qty 1

## 2023-10-11 NOTE — TOC Initial Note (Addendum)
 Transition of Care (TOC) - Initial/Assessment Note   Patient from home alone but has son close by if needed.   PCP is Rojelio Daring  with [facility]   PT/OT recommending HHPT/OT and walker and 3 in 1. NCM ordered both with Jermaine with Marcellus Sor with [facility] cannot accept referral due to insurance.   Jon with [facility] cannot accept due to insurance.   Left message for Amy with [facility]  , accepted referral    Patient Details  Name: Troy French MRN: [id number] Date of Birth: 10-29-1960  Transition of Care [facility]) CM/SW Contact:    Stephane Powell Jansky, RN Phone Number: 10/11/2023, 1:36 PM  Clinical Narrative:                   Expected Discharge Plan: Home w Home Health Services Barriers to Discharge: Continued Medical Work up   Patient Goals and CMS Choice Patient states their goals for this hospitalization and ongoing recovery are:: to return to home CMS Medicare.gov Compare Post Acute Care list provided to:: Patient Choice offered to / list presented to : Patient      Expected Discharge Plan and Services   Discharge Planning Services: CM Consult Post Acute Care Choice: Home Health, Durable Medical Equipment Living arrangements for the past 2 months: Single Family Home                 DME Arranged: 3-N-1, Walker rolling DME Agency: [employer] Date DME Agency Contacted: 10/11/23 Time DME Agency Contacted: 1336 Representative spoke with at DME Agency: [facility] Arranged: PT, OT          Prior Living Arrangements/Services Living arrangements for the past 2 months: Single Family Home Lives with:: Self Patient language and need for interpreter reviewed:: Yes Do you feel safe going back to the place where you live?: Yes      Need for Family Participation in Patient Care: Yes (Comment) Care giver support system in place?: Yes (comment)   Criminal Activity/Legal Involvement Pertinent to Current Situation/Hospitalization:  No - Comment as needed  Activities of Daily Living   ADL Screening (condition at time of admission) Independently performs ADLs?: Yes (appropriate for developmental age) Is the patient deaf or have difficulty hearing?: No Does the patient have difficulty seeing, even when wearing glasses/contacts?: No Does the patient have difficulty concentrating, remembering, or making decisions?: No  Permission Sought/Granted   Permission granted to share information with : Yes, Verbal Permission Granted     Permission granted to share info w AGENCY: DME agencies and home health agencies        Emotional Assessment Appearance:: Appears stated age Attitude/Demeanor/Rapport: Engaged Affect (typically observed): Appropriate Orientation: : Oriented to Self, Oriented to Place, Oriented to  Time, Oriented to Situation   Psych Involvement: No (comment)  Admission diagnosis:  Generalized abdominal pain [R10.84] Intractable nausea and vomiting [R11.2] Nausea and vomiting, unspecified vomiting type [R11.2] Impaction of colon (HCC) [K56.49] Patient Active Problem List   Diagnosis Date Noted   Impaction of colon (HCC) 10/08/2023   Intractable nausea and vomiting 10/05/2023   History of COPD 10/05/2023   Obstructive sleep apnea 10/05/2023   History of CAD (coronary artery disease) 10/05/2023   Chronic pain syndrome 10/05/2023   Generalized anxiety disorder 10/05/2023   Onychomycosis 08/18/2021   Enteritis 04/01/2021   Continuous dependence on cigarette smoking 04/28/2020   Sinus pause 02/11/2019   CAD in native artery 02/10/2019   Tobacco abuse  12/17/2018   Essential hypertension 12/17/2018   Hyperlipidemia 12/17/2018   COPD (chronic obstructive pulmonary disease) (HCC) 12/17/2018   Atypical chest pain 12/17/2018   Palpitations 12/17/2018   Coronary artery disease 12/17/2018   PCP:  Patient, No Pcp Per Pharmacy:   [id number] [address], [place] - [address] AT [facility] OF  [address] [place] [address] Phone: [phone] Fax: [phone]     Social Drivers of Health (SDOH) Social History: SDOH Screenings   Food Insecurity: No Food Insecurity (10/06/2023)  Housing: Unknown (10/06/2023)  Transportation Needs: No Transportation Needs (10/06/2023)  Utilities: Not At Risk (10/06/2023)  Social Connections: Unknown (11/11/2021)   Received from [facility]  Tobacco Use: High Risk (10/05/2023)   SDOH Interventions:     Readmission Risk Interventions     No data to display

## 2023-10-11 NOTE — Progress Notes (Signed)
  Progress Note   Patient: Troy French [id number] DOB: [birth year] DOA: 10/05/2023     3 DOS: the patient was seen and examined on 10/11/2023 at 8:46AM      Brief hospital course: 63 y.o. M with CAD, COPD, OSA, HTN, smoking and chronic pain syndrome who presented with left flank pain.      Assessment and Plan: Acute sigmoid diverticulitis Still with considerable pain -Continue IV antibiotics   Acute on chronic lower back pain -Continue home Percocet, MS Contin  - Outpatient follow-up with spine surgery or PM&R  Coronary disease Hypertension Hyperlipidemia -Continue atorvastatin , lisinopril , metoprolol , clopidogrel , ranolazine   COPD No evidence of flare - Continue Breztri , Singulair - Continue nicotine  supplement  Mood disorder -Continue home Valium       Subjective: Patient's pain is slightly less, but still limits him from eating and walking.  No fever, no confusion, no respiratory symptoms.      Physical Exam: BP 126/78 (BP Location: Left Arm)   Pulse 62   Temp 98.6 F (37 C)   Resp 18   SpO2 100%   Adult male, lying in bed, interactive and appropriate RRR, no murmurs, no peripheral edema Respiratory normal, lungs clear without rales or wheezes Abdomen soft, no rigidity, no guarding, no rebound, same discomfort more in the left suprapubic area, to deep palpation Attention normal, affect normal, judgment and insight appear normal    Data Reviewed: Basic metabolic panel unremarkable CBC normal     Family Communication:     Disposition: Status is: Inpatient         Author: Lonni SHAUNNA Dalton, MD 10/11/2023 2:07 PM  For on call review [email or web address].

## 2023-10-11 NOTE — Evaluation (Signed)
 Occupational Therapy Evaluation Patient Details Name: Troy French MRN: [id number] DOB: 1960/04/12 Today's Date: 10/11/2023   History of Present Illness   Troy French is a 63 y.o. male who presented to [facility] 10/05/23 with left flank pain with distention and rigidity and constipation for 1 week. CT demonstrated acute sigmoid diverticulitis. PMHx: CAD s/p multiple stents, COPD, OSA, HTN, HLD, smoking, and chronic pain syndrome.     Clinical Impressions Pt presents with decline in function and safety with ADLs and ADL mobility with impaired strength, balance and endurance. Eval limited due to pt perseverating on L groin pain and needing to finish ABX protocol before he can do anything and not being pushed out of the hospital until he gets better. 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 Pt required enocuragemetnt, to participate in OOB activity and impulsively got OOB, declined using RW, walked halfway to bathroom then spun around quickly lunged  back into bed and stating , I hurt too much and can't do anymore until all my anitbiotics are done!. OT attempted to educate pt on ADL ad mobility safety, however on fixated on taking ABX and not leaving the hospital until protocol is done. OT will follow acutely to maximize level of function and safety    If plan is discharge home, recommend the following:   A little help with bathing/dressing/bathroom;A little help with walking and/or transfers;Assistance with cooking/housework;Assist for transportation;Help with stairs or ramp for entrance     Functional Status Assessment   Patient has had a recent decline in their functional status and demonstrates the ability to make significant improvements  in function in a reasonable and predictable amount of time.     Equipment Recommendations   BSC/3in1;Tub/shower seat     Recommendations for Other Services         Precautions/Restrictions   Precautions Precautions: Fall Recall of Precautions/Restrictions: Intact Restrictions Weight Bearing Restrictions Per Provider Order: No     Mobility Bed Mobility Overal bed mobility: Needs Assistance Bed Mobility: Supine to Sit, Sit to Supine     Supine to sit: Contact guard Sit to supine: Contact guard assist   General bed mobility comments: reviewed log roll technique, however pt did not adhere to and impulsively got OOB    Transfers                   General transfer comment: pt required enocuragement, was argumentative. Pt impulsively got OOB, declined using RW, walked halfway to bathroom then spun around quickly lunging into bed and stating , I hurt too much and can't do more until all my anitbiotics are done!      Balance Overall balance assessment: Needs assistance Sitting-balance support: Bilateral upper extremity supported, Feet supported Sitting balance-Leahy Scale: Fair     Standing balance support: Bilateral upper extremity supported, During functional activity Standing balance-Leahy Scale: Poor                             ADL either performed or assessed with clinical judgement   ADL Overall ADL's : Needs assistance/impaired Eating/Feeding: Independent   Grooming: Wash/dry hands;Wash/dry face;Supervision/safety;Set up;Bed level   Upper Body Bathing: Set up;Bed level   Lower  Body Bathing: Moderate assistance;Bed level   Upper Body Dressing : Set up;Bed level   Lower Body Dressing: Moderate assistance;Bed level                 General ADL Comments: pt declined EOB/OOB ADL activity     Vision Ability to See in Adequate Light: 0 Adequate Patient Visual Report: No change from baseline       Perception          Praxis         Pertinent Vitals/Pain Pain Assessment Pain Score: 8  Pain Location: L groin (pt has been premedicated but states that until 3-4  day ABX protocol is done that he cannot do more) Pain Descriptors / Indicators: Cramping, Aching, Discomfort Pain Intervention(s): Limited activity within patient's tolerance, Monitored during session, Premedicated before session, Repositioned     Extremity/Trunk Assessment Upper Extremity Assessment Upper Extremity Assessment: Generalized weakness;Right hand dominant   Lower Extremity Assessment Lower Extremity Assessment: Defer to PT evaluation   Cervical / Trunk Assessment Cervical / Trunk Assessment: Normal   Communication Communication Communication: No apparent difficulties   Cognition Arousal: Alert Behavior During Therapy: Impulsive               OT - Cognition Comments: pt required enocuragement, was argumentative. Pt impulsively got OOB, declined using RW, walked halfway to bathroom then spun around quickly lunging into bed and stating , I hurt too much and can't do more until all my anitbiotics are done!                 Following commands: Intact       Cueing  General Comments   Cueing Techniques: Verbal cues      Exercises     Shoulder Instructions      Home Living Family/patient expects to be discharged to:: Private residence Living Arrangements: Alone Available Help at Discharge: Family;Available PRN/intermittently Type of Home: House Home Access: Stairs to enter [employer] of Steps: 6 Entrance Stairs-Rails: Right Home Layout: One level     Bathroom Shower/Tub: Chief Strategy Officer: Standard     Home Equipment: None          Prior Functioning/Environment Prior Level of Function : Independent/Modified Independent;Driving             Mobility Comments: Mod I without AD, takes increased time due back pain ADLs Comments: Ind with ADLs and IADLs,  reports harder than it used to be and takes increased time, drives, been borrowing his son's car and 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    OT Problem List: Decreased strength;Decreased activity tolerance;Impaired balance (sitting and/or standing);Decreased safety awareness;Pain   OT Treatment/Interventions: Self-care/ADL training;Therapeutic exercise;Patient/family education;Balance training;Therapeutic activities;DME and/or AE instruction      OT Goals(Current goals can be found in the care plan section)   Acute Rehab OT Goals Patient Stated Goal: none stated OT Goal Formulation: With patient Time For Goal Achievement: 10/25/23 Potential to Achieve Goals: Good ADL Goals Pt Will Perform Grooming: with contact guard assist;with supervision;standing Pt Will Perform Upper Body Bathing: with modified independence Pt Will Perform Lower Body Bathing: with min assist;with contact guard assist;with adaptive equipment Pt Will Perform Upper Body Dressing: with modified independence Pt Will Perform Lower Body Dressing: with min assist;with contact guard assist;with adaptive equipment Pt Will Transfer to Toilet: with contact guard assist;with supervision;ambulating Pt Will  Perform Toileting - Clothing Manipulation and hygiene: with contact guard assist;with supervision;sit to/from stand   OT Frequency:  Min 2X/week    Co-evaluation              AM-PAC OT 6 Clicks Daily Activity     Outcome Measure Help from another person eating meals?: None Help from another person taking care of personal grooming?: A Little Help from another person toileting, which includes using toliet, bedpan, or urinal?: Total Help from another person bathing (including washing, rinsing, drying)?: A Lot Help from another person to put on and taking off regular upper body clothing?: A Little Help from another  person to put on and taking off regular lower body clothing?: A Lot 6 Click Score: 15   End of Session Nurse Communication: Mobility status;Other (comment) (behavior reported to RN)  Activity Tolerance: Treatment limited secondary to agitation;Patient limited by pain Patient left: in bed  OT Visit Diagnosis: Unsteadiness on feet (R26.81);Other abnormalities of gait and mobility (R26.89);Muscle weakness (generalized) (M62.81);Pain Pain - Right/Left: Left Pain - part of body:  (groin)                Time: 8873-8854 OT Time Calculation (min): 19 min Charges:  OT General Charges $OT Visit: 1 Visit OT Evaluation $OT Eval Moderate Complexity: 1 Mod    Jacques Karna Loose 10/11/2023, 1:22 PM

## 2023-10-11 NOTE — Hospital Course (Signed)
 63 y.o. M with CAD, COPD, OSA, HTN, smoking and chronic pain syndrome who presented with left flank pain.

## 2023-10-11 NOTE — Plan of Care (Signed)

## 2023-10-11 NOTE — Evaluation (Signed)
 Physical Therapy Evaluation Patient Details Name: Troy French MRN: [id number] DOB: October 17, 1960 Today's Date: 10/11/2023  History of Present Illness  Troy French is a 63 y.o. male who presented to [facility] 10/05/23 with left flank pain with distention and rigidity and constipation for 1 week. CT demonstrated acute sigmoid diverticulitis. PMHx: CAD s/p multiple stents, COPD, OSA, HTN, HLD, smoking, and chronic pain syndrome.   Clinical Impression  Pt admitted with above diagnosis. PTA, pt was modI for functional mobility, ADLs, and IADLs d/t requiring increased time. He lives alone in a one story house with 6 STE. Pt currently with functional limitations due to the deficits listed below (see PT Problem List). He required CGA for bed mobility, transfers, and gait using RW. Pt ambulated a short-distance within the room. He is currently limited by pain, generalized LE weakness, and decreased activity tolerance. Pt perseverated on L groin pain and reported he doesn't feel like he can really do anything. Educated pt on the importance of participating with therapy and continuing to mobilize frequently to decrease the negative effects associated with bed rest. Discussed HEP he could perform supine in bed or seated in chair. Encouraged pt to complete bed<>chair transfers 3x/day, go into the bathroom with nursing assistance, and frequently mobilize with staff assistance. Pt will benefit from acute skilled PT to increase his independence and safety with mobility to allow discharge. Recommend HHPT to increased strength, improve balance, increase activity tolerance, decrease fall risk, and optimize safety within the home environment.      If plan is discharge home, recommend the following: A little help with walking and/or transfers;A little help with bathing/dressing/bathroom;Assistance with cooking/housework;Assist for transportation;Help with stairs or ramp for entrance   Can travel by private vehicle         Equipment Recommendations Rolling walker (2 wheels);BSC/3in1  Recommendations for Other Services       Functional Status Assessment Patient has had a recent decline in their functional status and demonstrates the ability to make significant improvements in function in a reasonable and predictable amount of time.     Precautions / Restrictions Precautions Precautions: Fall Recall of Precautions/Restrictions: Intact Restrictions Weight Bearing Restrictions Per Provider Order: No      Mobility  Bed Mobility Overal bed mobility: Needs Assistance Bed Mobility: Rolling, Sidelying to Sit, Sit to Sidelying Rolling: Contact guard assist, Used rails Sidelying to sit: Contact guard assist, HOB elevated, Used rails     Sit to sidelying: Contact guard assist, HOB elevated General bed mobility comments: Educated pt on log roll technique. Cues for sequencing. He sat up on R side of bed with increased time. Assist to roll with aid of bed pad and to elevate trunk. Returning to bed pt required assist to bring BLE back into bed.    Transfers Overall transfer level: Needs assistance Equipment used: Rolling walker (2 wheels) Transfers: Sit to/from Stand Sit to Stand: Contact guard assist           General transfer comment: Pt stood from lowest bed height. Cued proper hand placement using RW. Powered up with light assist. Good eccentric control.    Ambulation/Gait Ambulation/Gait assistance: Contact guard assist Gait Distance (Feet): 15 Feet Assistive device: Rolling walker (2 wheels) Gait Pattern/deviations: Step-to pattern, Decreased step length - right, Decreased step length - left, Decreased stride length Gait velocity: decreased     General Gait Details: Pt ambulated with short slow steps. Pt maintained upright posture with good proximity to RW. He was slow to advance LLE  d/t pain. Heavy reliance on BUE support on RW. Distance limited d/t pain. Pt manuevered within room well, no  LOB.  Stairs            Wheelchair Mobility     Tilt Bed    Modified Rankin (Stroke Patients Only)       Balance Overall balance assessment: Needs assistance Sitting-balance support: Bilateral upper extremity supported, Feet supported Sitting balance-Leahy Scale: Fair Sitting balance - Comments: Pt sat EOB with supervision   Standing balance support: Bilateral upper extremity supported, During functional activity, Reliant on assistive device for balance Standing balance-Leahy Scale: Poor Standing balance comment: Pt dependent on RW                             Pertinent Vitals/Pain Pain Assessment Pain Assessment: 0-10 Pain Score: 8  Pain Location: L groin Pain Descriptors / Indicators: Spasm, Cramping, Squeezing, Aching, Discomfort Pain Intervention(s): Limited activity within patient's tolerance, Monitored during session, Repositioned, Patient requesting pain meds-RN notified    Home Living Family/patient expects to be discharged to:: Private residence Living Arrangements: Alone Available Help at Discharge: Family;Available PRN/intermittently (Son lives in [place]) Type of Home: House Home Access: Stairs to enter Entrance Stairs-Rails: Right Entrance Stairs-Number of Steps: 6   Home Layout: One level Home Equipment: None Additional Comments: Pt reports he has starting to look for a new place to live closer to his son.    Prior Function Prior Level of Function : Independent/Modified Independent;Driving             Mobility Comments: ModI without AD, takes increased time d/t back pain. Denies fall history. ADLs Comments: ModI with ADLs, reports it is not as easy as it used to be and takes increased time. Drives, been borrowing his son's car.  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     Extremity/Trunk Assessment   Upper Extremity Assessment Upper Extremity Assessment: Defer to OT evaluation     Lower Extremity Assessment Lower Extremity Assessment: Generalized weakness;RLE deficits/detail;LLE deficits/detail RLE Deficits / Details: Decreased AROM d/t pain. Pt did not tolerate any resistance d/t pain. Grossly 3/5 strength. RLE: Unable to fully assess due to pain LLE Deficits / Details: Decreased AROM d/t pain. Pt did not tolerate any resistance d/t pain. Grossly 3/5 strength. LLE: Unable to fully assess due to pain    Cervical / Trunk Assessment Cervical / Trunk Assessment: Normal  Communication   Communication Communication: No apparent difficulties    Cognition Arousal: Alert Behavior During Therapy: WFL for tasks assessed/performed   PT - Cognitive impairments: No apparent impairments                       PT - Cognition Comments: Pt A,Ox4. He perseverated on L groin pain. Required frequent redirection to focus on task. Encouragement to mobilize and sit up in recliner chair. Following commands: Intact       Cueing Cueing Techniques: Verbal cues     General Comments General comments (skin integrity, edema, etc.): VSS on RA    Exercises     Assessment/Plan    PT Assessment Patient needs continued PT services  PT Problem List Decreased strength;Decreased activity tolerance;Decreased balance;Decreased mobility;Decreased knowledge of use of DME;Decreased safety awareness;Pain       PT Treatment Interventions DME instruction;Gait training;Stair training;Functional mobility training;Therapeutic activities;Therapeutic exercise;Balance training;Patient/family education    PT Goals (Current goals can be found in the  Care Plan section)  Acute Rehab PT Goals Patient Stated Goal: Have less pain and regain independence PT Goal Formulation: With patient Time For Goal Achievement: 10/25/23 Potential to Achieve Goals: Good    Frequency Min 2X/week     Co-evaluation               AM-PAC PT 6 Clicks Mobility  Outcome Measure Help needed turning  from your back to your side while in a flat bed without using bedrails?: A Little Help needed moving from lying on your back to sitting on the side of a flat bed without using bedrails?: A Little Help needed moving to and from a bed to a chair (including a wheelchair)?: A Little Help needed standing up from a chair using your arms (e.g., wheelchair or bedside chair)?: A Little Help needed to walk in hospital room?: A Little Help needed climbing 3-5 steps with a railing? : A Lot 6 Click Score: 17    End of Session Equipment Utilized During Treatment: Gait belt Activity Tolerance: Patient tolerated treatment well;Patient limited by pain Patient left: in bed;with call bell/phone within reach;with bed alarm set Nurse Communication: Mobility status PT Visit Diagnosis: Pain;Difficulty in walking, not elsewhere classified (R26.2);Unsteadiness on feet (R26.81);Muscle weakness (generalized) (M62.81) Pain - Right/Left: Left Pain - part of body: Hip (Groin)    Time: 9096-9079 PT Time Calculation (min) (ACUTE ONLY): 17 min   Charges:   PT Evaluation $PT Eval Moderate Complexity: 1 Mod   PT General Charges $$ ACUTE PT VISIT: 1 Visit         Troy French, PT, DPT Acute Rehabilitation Services Office: [phone] Secure Chat Preferred  Troy French 10/11/2023, 9:58 AM

## 2023-10-12 ENCOUNTER — Other Ambulatory Visit (HOSPITAL_COMMUNITY): Payer: Self-pay

## 2023-10-12 DIAGNOSIS — K5792 Diverticulitis of intestine, part unspecified, without perforation or abscess without bleeding: Secondary | ICD-10-CM | POA: Diagnosis not present

## 2023-10-12 MED ORDER — AMOXICILLIN-POT CLAVULANATE 875-125 MG PO TABS
1.0000 | ORAL_TABLET | Freq: Two times a day (BID) | ORAL | 0 refills | Status: AC
  Filled 2023-10-12: qty 11, 6d supply, fill #0

## 2023-10-12 NOTE — Progress Notes (Signed)
 Pain medication administered. Removed IV, site clean, dry and intact.

## 2023-10-12 NOTE — Progress Notes (Signed)
 Reviewed AVS, patient expressed understanding of medications, MD follow up reviewed.   Patient states all belongings brought to the hospital at time of admission are accounted for and packed to take home.  Picked up medications from [facility] pharmacy. Patient currently has IV medications being administered and will be able to leave once completed. Primary nurse notified. Nursing staff will be contacted to transport patient to entrance A,  where family member was waiting in vehicle to transport home.

## 2023-10-12 NOTE — Care Management Important Message (Signed)
 Important Message  Patient Details  Name: Troy French MRN: [id number] Date of Birth: June 30, 1960   Important Message Given:  Yes - Medicare IM     Jon Cruel 10/12/2023, 8:37 AM

## 2023-10-12 NOTE — Progress Notes (Signed)
 Occupational Therapy Treatment Patient Details Name: Troy French MRN: [id number] DOB: [birth year] Today's Date: 10/12/2023   History of present illness Troy French is a 63 y.o. male who presented to [facility] 10/05/23 with left flank pain with distention and rigidity and constipation for 1 week. CT demonstrated acute sigmoid diverticulitis. PMHx: CAD s/p multiple stents, COPD, OSA, HTN, HLD, smoking, and chronic pain syndrome.   OT comments  Pt. Seen for skilled OT treatment session.  Pt. In good spirits reporting feeling much better than yesterday.  Pt. Stating with rn confirming that he is moving around the room Independently for mobility and ADL completion.  Pt. Able to demonstrated this with LB dressing, bed mobility, in room ambulation, simulated toilet transfer in b.room, and simulated tub transfer in room.  Pt. Reports no questions or concerns with abilities.  Evaluating OTR/L notified acute OT goals met, pt. Clear for d/c.           If plan is discharge home, recommend the following:  A little help with bathing/dressing/bathroom;A little help with walking and/or transfers;Assistance with cooking/housework;Assist for transportation;Help with stairs or ramp for entrance   Equipment Recommendations  BSC/3in1;Tub/shower seat    Recommendations for Other Services      Precautions / Restrictions Precautions Precautions: Fall Recall of Precautions/Restrictions: Intact       Mobility Bed Mobility Overal bed mobility: Independent Bed Mobility: Supine to Sit, Sit to Supine           General bed mobility comments: supine to sit, I, sit/supine hob lowered for simulated home env. pt. able to complete w/o use of rails with no physical assistance    Transfers Overall transfer level: Independent Equipment used: None Transfers: Sit to/from Stand, Bed to chair/wheelchair/BSC Sit to Stand: Independent           General transfer comment: pt. and rn confirm pt. moving I throughout room  and to/from RN station as observed today.     Balance                                           ADL either performed or assessed with clinical judgement   ADL Overall ADL's : Independent                     Lower Body Dressing: Independent;Sitting/lateral leans Lower Body Dressing Details (indicate cue type and reason): able to manage RLE by crossing over figure 4, but has to bend slightly forward to access LLE, educated on fall risk with bending forward Toilet Transfer: Independent       Tub/ Engineer, structural: Tub Metallurgist Details (indicate cue type and reason): simulated in room with tub/shower with R faucet, pt. able to simulate step over w/o lob or issue   General ADL Comments: per pt. and RN confirmation pt. moving I throughout room for ADLs and in room    Extremity/Trunk Assessment              Vision       Perception     Praxis     Communication Communication Communication: No apparent difficulties   Cognition Arousal: Alert Behavior During Therapy: WFL for tasks assessed/performed, Impulsive               OT - Cognition Comments: attempting to move and do things before therapist asst. finished explaining and reviewing the indicated  task                 Following commands: Intact        Cueing   Cueing Techniques: Verbal cues  Exercises      Shoulder Instructions       General Comments      Pertinent Vitals/ Pain       Pain Assessment Pain Assessment: No/denies pain Pain Intervention(s): Premedicated before session  Home Living                                          Prior Functioning/Environment              Frequency  Min 2X/week        Progress Toward Goals  OT Goals(current goals can now be found in the care plan section)  Progress towards OT goals: Goals met/education completed, patient discharged from OT     Plan      Co-evaluation                  AM-PAC OT 6 Clicks Daily Activity     Outcome Measure   Help from another person eating meals?: None Help from another person taking care of personal grooming?: A Little Help from another person toileting, which includes using toliet, bedpan, or urinal?: Total Help from another person bathing (including washing, rinsing, drying)?: A Lot Help from another person to put on and taking off regular upper body clothing?: A Little Help from another person to put on and taking off regular lower body clothing?: A Lot 6 Click Score: 15    End of Session Equipment Utilized During Treatment: Gait belt  OT Visit Diagnosis: Unsteadiness on feet (R26.81);Other abnormalities of gait and mobility (R26.89);Muscle weakness (generalized) (M62.81);Pain Pain - Right/Left: Left   Activity Tolerance Patient tolerated treatment well   Patient Left Other (comment) (seated in chair at counter area, rn confirms pt. I in room)   Nurse Communication Mobility status;Other (comment) (rn confirms pt. I  in room no alarms indicated)        Time: 1203-1212 OT Time Calculation (min): 9 min  Charges: OT General Charges $OT Visit: 1 Visit OT Treatments $Self Care/Home Management : 8-22 mins  Troy French, COTA/L Acute Rehabilitation [phone]   Troy French-COTA/L  10/12/2023, 12:29 PM

## 2023-10-12 NOTE — Discharge Summary (Signed)
 Physician Discharge Summary   Patient: Troy French MRN: [id number] DOB: 03-Jul-1960  Admit date:     10/05/2023  Discharge date: 10/12/23  Discharge Physician: Lonni SHAUNNA Dalton   PCP: Delores Rojelio Caldron, NP     Recommendations at discharge:  Follow up with PCP Rojelio Delores in 1 week for acute sigmoid diverticulitis     Discharge Diagnoses: Principal Problem:   Acute diverticulitis Active Problems:   Essential hypertension   COPD   Smoking    Obstructive sleep apnea   CAD (coronary artery disease)   Chronic pain syndrome   Generalized anxiety disorder      Hospital Course: 63 y.o. M with CAD, COPD, OSA, HTN, smoking and chronic pain syndrome who presented with left flank pain.     Acute sigmoid diverticulitis Initial CT imaging with contrast showed stool impaction only, started on bowel regimen.    Pain did not improve, so repeat imaging obtained that showed now a focal area of mural wall thickening consistent with diverticulitis.  He was started on Zosyn and had clinical improvement.  Completed 4d Zosyn in the hospital, now afebrile, WBC normal, HR and RR normal, taking orals and ambulating.    Discharged with 5 more days Augmentin and PCP follow up.      Chronic lower back pain Stable here on MS Contin  and Percocet.  There was some consideration that his abdominal pain was referred pain from the back, so MRI L spine obtained that showed known degenerative disc disease and spinal stenosis, no signs of discitis/osteo.  Given lack of leg weakness and improvement with antibiotics, suspect this was not related.     Scrotal pain Evaluated with physical exam and ultrasound, both normal.  Resolved with treatment of diverticulitis.  Coronary disease Hypertension Hyperlipidemia Stable on atorvastatin , lisinopril , metoprolol , clopidogrel , ranolazine    COPD No evidence of flare Stable on Breztri , Singulair              The [place]  Controlled  Substances Registry was reviewed for this patient prior to discharge.  Consultants: None Procedures performed: CT abdomen and pelvis MRI lumbar spine Scrotal ultrasound CT abdomen and pelvis   Disposition: Home Diet recommendation:  Discharge Diet Orders (From admission, onward)     Start     Ordered   10/12/23 0000  Diet - low sodium heart healthy        10/12/23 1556             DISCHARGE MEDICATION: Allergies as of 10/12/2023       Reactions   Nitroglycerin Nausea And Vomiting   SWEATING   Isosorbide Dinitrate Nausea And Vomiting        Medication List     TAKE these medications    albuterol  108 (90 Base) MCG/ACT inhaler Commonly known as: VENTOLIN  HFA Inhale 2 puffs into the lungs every 6 (six) hours as needed for wheezing or shortness of breath.   amoxicillin-clavulanate 875-125 MG tablet Commonly known as: AUGMENTIN Take 1 tablet by mouth 2 (two) times daily.   atorvastatin  40 MG tablet Commonly known as: LIPITOR  Take 1 tablet (40 mg total) by mouth daily.   clopidogrel  75 MG tablet Commonly known as: PLAVIX  Take 1 tablet (75 mg total) by mouth daily.   cyclobenzaprine 10 MG tablet Commonly known as: FLEXERIL Take 10 mg by mouth 3 (three) times daily.   diazepam  2 MG tablet Commonly known as: VALIUM  Take 2 mg by mouth every 12 (twelve) hours as needed for anxiety.  famotidine  20 MG tablet Commonly known as: PEPCID  Take 20 mg by mouth 2 (two) times daily.   furosemide 40 MG tablet Commonly known as: LASIX Take 40 mg by mouth daily.   lisinopril  10 MG tablet Commonly known as: ZESTRIL  Take 1 tablet (10 mg total) by mouth daily.   metoprolol  succinate 100 MG 24 hr tablet Commonly known as: TOPROL -XL Take 100 mg by mouth daily.   montelukast 10 MG tablet Commonly known as: SINGULAIR Take 10 mg by mouth every morning.   morphine  30 MG 12 hr tablet Commonly known as: MS CONTIN  Take 30 mg by mouth daily.   oxyCODONE -acetaminophen   10-325 MG tablet Commonly known as: PERCOCET Take 1 tablet by mouth 5 (five) times daily.   pantoprazole  40 MG tablet Commonly known as: PROTONIX  Take 1 tablet (40 mg total) by mouth daily.   ranolazine  1000 MG SR tablet Commonly known as: RANEXA  Take 1,000 mg by mouth every morning.   tadalafil 5 MG tablet Commonly known as: CIALIS Take 5 mg by mouth daily.   tamsulosin 0.4 MG Caps capsule Commonly known as: FLOMAX Take 0.4 mg by mouth every evening.   Trelegy Ellipta 200-62.5-25 MCG/ACT Aepb Generic drug: Fluticasone -Umeclidin-Vilant SMARTSIG:2 Puff(s) Via Inhaler Daily   zolpidem  10 MG tablet Commonly known as: AMBIEN  Take 10 mg by mouth at bedtime.               Durable Medical Equipment  (From admission, onward)           Start     Ordered   10/11/23 1331  For home use only DME Walker rolling  Once       Question Answer Comment  Walker: With 5 Inch Wheels   Patient needs a walker to treat with the following condition Weakness      10/11/23 1330   10/11/23 1331  For home use only DME 3 n 1  Once        10/11/23 1330            Follow-up Information     [facility] Follow up.   Specialty: Home Health Services Why: [facility] Contact information: [address] [place] [address] [phone]         Delores Rojelio Caldron, NP. Schedule an appointment as soon as possible for a visit in 1 week(s).   Specialty: Nurse Practitioner Contact information: [address] [place] [address] [phone]                 Discharge Instructions     Diet - low sodium heart healthy   Complete by: As directed    Discharge instructions   Complete by: As directed    **IMPORTANT DISCHARGE INSTRUCTIONS**   From Dr. Jonel: You were admitted for abdominal pain.  Your initial imaging showed no evidence of disease, only constipation.  Your repeat CAT scan showed a small area of diverticulitis, so you  were started on antibiotics  You completed 4 days here, you should complete 5 more days with Augmentin, starting tonight  Go see your primary doctor in 1 week for follow up   Increase activity slowly   Complete by: As directed        Discharge Exam: There were no vitals filed for this visit.  General: Pt is alert, awake, not in acute distress Cardiovascular: RRR, nl S1-S2, no murmurs appreciated.   No LE edema.   Respiratory: Normal respiratory rate and  rhythm.  CTAB without rales or wheezes. Abdominal: Abdomen soft and with subtle LLQ pain, no guarding.  No distension or HSM.   Neuro/Psych: Strength symmetric in upper and lower extremities.  Judgment and insight appear normal.   Condition at discharge: good  The results of significant diagnostics from this hospitalization (including imaging, microbiology, ancillary and laboratory) are listed below for reference.   Imaging Studies: CT PELVIS W CONTRAST Result Date: 10/09/2023 CLINICAL DATA:  New onset inguinal and scrotal pain with no acute findings on scrotal ultrasound today. EXAM: CT PELVIS WITH CONTRAST TECHNIQUE: Multidetector CT imaging of the pelvis was performed using the standard protocol following the bolus administration of intravenous contrast. RADIATION DOSE REDUCTION: This exam was performed according to the departmental dose-optimization program which includes automated exposure control, adjustment of the mA and/or kV according to patient size and/or use of iterative reconstruction technique. CONTRAST:  75mL OMNIPAQUE  IOHEXOL  350 MG/ML SOLN COMPARISON:  Recent chest, abdomen and pelvis CT without contrast from 10/05/2023. FINDINGS: Urinary Tract: The pelvic ureters are small in caliber without distal ureteral stones. Bladder appears unremarkable for its degree of distention. Bowel: Diverticulosis of the distal descending and sigmoid colon is again noted with interval new demonstration of inflammatory stranding around the  visualized distal descending colon, which could be from diverticulitis or colitis. The sigmoid diverticula are uncomplicated. The appendix is not in the plane of current imaging. The pelvic bowel is otherwise unremarkable. Vascular/Lymphatic: There is moderate aortoiliac atherosclerosis. No other significant vascular findings. No adenopathy is seen. Reproductive: Enlarged prostate, 4.8 cm transverse, mildly heterogeneous. Correlate with PSA levels. Other: No free fluid, free hemorrhage, free air or abscess. No incarcerated hernia. There are small inguinal fat hernias. Musculoskeletal: Advanced degenerative change lower lumbar spine. Acquired spinal stenosis L3-4 and L4-5. Acquired foraminal stenosis again noted L3-4 down. No acute or other significant osseous findings. IMPRESSION: 1. Diverticulosis of the distal descending and sigmoid colon with interval new demonstration of inflammatory stranding around the visualized distal descending colon, which could be from diverticulitis or colitis. 2. No free fluid, free air or abscess. 3. Aortic atherosclerosis. 4. Prostatomegaly with prostatic heterogeneity. Correlate with PSA levels. 5. Small inguinal fat hernias. 6. Advanced degenerative change lower lumbar spine with acquired spinal and foraminal stenosis. Aortic Atherosclerosis (ICD10-I70.0). Electronically Signed   By: Francis Quam M.D.   On: 10/09/2023 00:24   US  SCROTUM W/DOPPLER Result Date: 10/08/2023 CLINICAL DATA:  Testicular swelling. EXAM: SCROTAL ULTRASOUND DOPPLER ULTRASOUND OF THE TESTICLES TECHNIQUE: Complete ultrasound examination of the testicles, epididymis, and other scrotal structures was performed. Color and spectral Doppler ultrasound were also utilized to evaluate blood flow to the testicles. COMPARISON:  None Available. FINDINGS: Right testicle Measurements: 4.6 x 2.3 x 2.2 cm. No mass or microlithiasis visualized. Left testicle Measurements: 4.4 x 2.0 x 2.8 cm. No mass or microlithiasis  visualized. Right epididymis:  Normal in size and appearance. Left epididymis:  Normal in size and appearance. Hydrocele: Small bilateral hydroceles with low-level echogenic debris. Varicocele:  None visualized. Pulsed Doppler interrogation of both testes demonstrates normal low resistance arterial and venous waveforms bilaterally. IMPRESSION: 1. Unremarkable testicles. 2. Small minimally complex bilateral hydroceles. Electronically Signed   By: Vanetta Chou M.D.   On: 10/08/2023 18:56   DG Abd Portable 1V Result Date: 10/08/2023 CLINICAL DATA:  Abdominal pain. EXAM: PORTABLE ABDOMEN - 1 VIEW COMPARISON:  None Available. FINDINGS: Mild colonic stool burden. No bowel dilatation or evidence of obstruction. No free air or radiopaque calculi. Degenerative changes  spine. No acute osseous pathology. IMPRESSION: Nonobstructive bowel gas pattern. Electronically Signed   By: Vanetta Chou M.D.   On: 10/08/2023 13:53   MR LUMBAR SPINE W WO CONTRAST Result Date: 10/07/2023 CLINICAL DATA:  Initial evaluation for lumbar radiculopathy, infection suspected. EXAM: MRI LUMBAR SPINE WITHOUT AND WITH CONTRAST TECHNIQUE: Multiplanar and multiecho pulse sequences of the lumbar spine were obtained without and with intravenous contrast. CONTRAST:  8mL GADAVIST GADOBUTROL 1 MMOL/ML IV SOLN COMPARISON:  Prior MRI from 11/05/2019. FINDINGS: Segmentation: Standard. Lowest well-formed disc space labeled the L5-S1 level. Alignment: Straightening of the normal lumbar lordosis. No significant listhesis. Vertebrae: Vertebral body height maintained without acute or chronic fracture. Bone marrow signal intensity somewhat heterogeneous but overall within normal limits. No worrisome osseous lesions. Exuberant marrow edema present about the L4-5 and L5-S1 interspaces, felt to be degenerative in nature. Extension into the right pedicles of L4 and L5. No other evidence for osteomyelitis discitis or septic arthritis. No other abnormal marrow  edema or enhancement. Conus medullaris and cauda equina: Conus extends to the L1 level. Conus demonstrates a normal appearance. Proximal nerve roots of the cauda equina are irregular due to stenosis at the L2-3 level. Paraspinal and other soft tissues: Paraspinous soft tissues within normal limits. Few scattered T2 hyperintense cyst noted about the visualized kidneys, benign in appearance, no follow-up imaging recommended. Disc levels: L1-2: Mild degenerative intervertebral disc space narrowing with diffuse disc bulge. Mild bilateral facet spurring. No significant spinal stenosis. Mild left L1 foraminal narrowing. Right neural foramen remains patent. L2-3: Degenerative vertebral disc space narrowing with diffuse disc bulge and disc desiccation. Reactive endplate change with marginal endplate osteophytic spurring. Superimposed left foraminal to extraforaminal disc protrusion (series 8, image 15). Mild bilateral facet hypertrophy. Prominence of the dorsal epidural fat. Resultant moderate to severe spinal stenosis. Mild to moderate bilateral L2 foraminal narrowing. L3-4: Degenerative intervertebral disc space narrowing with disc desiccation and diffuse disc bulge. Reactive endplate spurring. Mild left greater than right facet and ligament flavum hypertrophy. Mild epidural lipomatosis. Resultant moderate spinal stenosis, largely due to epidural lipomatosis. Mild to moderate bilateral L3 foraminal stenosis. L4-5: Degenerative intervertebral disc space narrowing with disc desiccation and diffuse disc bulge. Reactive endplate change with marginal endplate osteophytic spurring and reactive marrow edema. Moderate facet hypertrophy. Epidural lipomatosis. Resultant severe spinal stenosis. Moderate left with severe right L4 foraminal narrowing. L5-S1: Degenerative intervertebral disc space narrowing with disc desiccation and diffuse disc bulge. Reactive endplate change with endplate osteophytic spurring and marrow edema.  Moderate left greater than right facet hypertrophy. Mild epidural lipomatosis. No significant spinal stenosis. Severe left with moderate right L5 foraminal narrowing. IMPRESSION: 1. No MRI evidence for acute infection within the lumbar spine. 2. Multifactorial degenerative changes at L2-3 with resultant moderate to severe spinal stenosis, with mild to moderate bilateral L2 foraminal narrowing. 3. Multifactorial degenerative changes at L3-4 with resultant moderate spinal stenosis, with mild to moderate bilateral L3 foraminal narrowing. 4. Degenerative disc bulge with facet hypertrophy at L4-5 with resultant severe spinal stenosis, with moderate left and severe right L4 foraminal narrowing. 5. Degenerative disc bulge with facet hypertrophy at L5-S1 with resultant severe left and moderate right L5 foraminal stenosis. Electronically Signed   By: Morene Hoard M.D.   On: 10/07/2023 03:17   CT CHEST ABDOMEN PELVIS WO CONTRAST Result Date: 10/05/2023 CLINICAL DATA:  Sepsis. Left-sided abdominal pain, distension, and rigidity. Chest pressure. Nausea and vomiting. EXAM: CT CHEST, ABDOMEN AND PELVIS WITHOUT CONTRAST TECHNIQUE: Multidetector CT imaging of the  chest, abdomen and pelvis was performed following the standard protocol without IV contrast. RADIATION DOSE REDUCTION: This exam was performed according to the departmental dose-optimization program which includes automated exposure control, adjustment of the mA and/or kV according to patient size and/or use of iterative reconstruction technique. COMPARISON:  Chest radiograph 10/05/2023. CT abdomen and pelvis 10/03/2023. CT chest 11/30/2021 FINDINGS: CT CHEST FINDINGS Cardiovascular: Normal heart size. No pericardial effusions. Calcification in the aorta and coronary arteries. Probable coronary stents. Normal caliber thoracic aorta. Mediastinum/Nodes: Thyroid  gland is unremarkable. Esophagus is decompressed. Calcified right hilar and mediastinal lymph nodes  consistent with postinflammatory changes. No significant lymphadenopathy. Lungs/Pleura: Calcified granulomas in the right lung. Fine interstitial subpleural infiltrates in the lungs probably representing early interstitial lung disease or bronchiolitis. Mild bronchiectasis. Emphysematous changes in the lungs. No pleural effusion or pneumothorax. Scattered scarring in the lungs. Musculoskeletal: Degenerative changes in the spine. No acute bony abnormalities. CT ABDOMEN PELVIS FINDINGS Hepatobiliary: No focal liver abnormality is seen. No gallstones, gallbladder wall thickening, or biliary dilatation. Pancreas: Unremarkable. No pancreatic ductal dilatation or surrounding inflammatory changes. Spleen: Normal in size without focal abnormality. Calcified granulomas in the spleen. Adrenals/Urinary Tract: Adrenal glands are unremarkable. Kidneys are normal, without renal calculi, focal lesion, or hydronephrosis. Bladder is unremarkable. Stomach/Bowel: Stomach is within normal limits. Appendix appears normal. No evidence of bowel wall thickening, distention, or inflammatory changes. Vascular/Lymphatic: Aortic atherosclerosis. No enlarged abdominal or pelvic lymph nodes. Reproductive: Prostate is unremarkable. Other: No abdominal wall hernia or abnormality. No abdominopelvic ascites. Musculoskeletal: Degenerative changes in the spine. No acute bony abnormalities. IMPRESSION: 1. Emphysematous changes in the lungs. 2. Subpleural interstitial changes with mild bronchiectasis suggesting respiratory bronchiolitis. 3. Aortic atherosclerosis. 4. No acute process demonstrated in the abdomen or pelvis. Electronically Signed   By: Elsie Gravely M.D.   On: 10/05/2023 15:50   DG Chest Port 1 View Result Date: 10/05/2023 EXAM: 1 VIEW(S) XRAY OF THE CHEST 10/05/2023 01:43:00 PM COMPARISON: 10/03/2023 CLINICAL HISTORY: cp. Per triage notes: BIB [facility] from home for left side abd pain, distention and rigidity. and chest pressure. No  BM in a week. Nausea/ Vomiting.  FINDINGS: LUNGS AND PLEURA: Increased interstitial markings which may reflect early pulmonary edema versus atypical inflammation or infection. No focal pulmonary opacity. No pleural effusion. No pneumothorax. HEART AND MEDIASTINUM: Calcified mediastinal lymph nodes. No acute abnormality of the cardiac and mediastinal silhouettes. BONES AND SOFT TISSUES: No acute osseous abnormality. IMPRESSION: 1. Increased interstitial markings, which may reflect early pulmonary edema versus atypical inflammation or infection. Electronically signed by: Waddell Calk MD 10/05/2023 02:18 PM EDT RP Workstation: [id number]   CT Renal Stone Study Result Date: 10/03/2023 CLINICAL DATA:  Abdominal/flank pain, stone suspected EXAM: CT ABDOMEN AND PELVIS WITHOUT CONTRAST TECHNIQUE: Multidetector CT imaging of the abdomen and pelvis was performed following the standard protocol without IV contrast. RADIATION DOSE REDUCTION: This exam was performed according to the departmental dose-optimization program which includes automated exposure control, adjustment of the mA and/or kV according to patient size and/or use of iterative reconstruction technique. COMPARISON:  04/01/2021 FINDINGS: Of note, the lack of intravenous contrast limits evaluation of the solid organ parenchyma and vascularity. Lower chest: No focal airspace consolidation or pleural effusion. Right lower lobe calcified granulomas. Dense coronary atherosclerosis in the right coronary artery. Hepatobiliary: No mass.No radiopaque stones or wall thickening of the gallbladder. No intrahepatic or extrahepatic biliary ductal dilation. Pancreas: No mass or main ductal dilation. No peripancreatic inflammation or fluid collection. Spleen: Normal size. No mass. Adrenals/Urinary  Tract: No adrenal masses. A couple of small cysts noted in both lower poles of the kidneys. No hydronephrosis or nephrolithiasis. The urinary bladder is distended without focal  abnormality. Stomach/Bowel: The stomach is decompressed without focal abnormality. No small bowel wall thickening or inflammation. No small bowel obstruction.Normal appendix. Scattered colonic diverticulosis. No changes of acute diverticulitis. Vascular/Lymphatic: No aortic aneurysm. Diffuse aortoiliac atherosclerosis. No intraabdominal or pelvic lymphadenopathy. Circumaortic left renal vein. Reproductive: No prostatomegaly. No free pelvic fluid. Other: No pneumoperitoneum, ascites, or mesenteric inflammation. Musculoskeletal: No acute fracture or destructive lesion. Multilevel degenerative disc disease of the spine. IMPRESSION: No acute intra-abdominal or pelvic abnormality. Electronically Signed   By: Rogelia Myers M.D.   On: 10/03/2023 18:42   DG Chest 2 View Result Date: 10/03/2023 CLINICAL DATA:  Pain.  Left flank pain for 2 weeks. EXAM: CHEST - 2 VIEW COMPARISON:  04/01/2021 FINDINGS: Normal heart size and pulmonary vascularity. No focal airspace disease or consolidation in the lungs. No blunting of costophrenic angles. No pneumothorax. Mediastinal contours appear intact. Calcification of the aorta. Calcified mediastinal lymph nodes. Degenerative changes in the spine and shoulders. Coronary artery stents. IMPRESSION: No active cardiopulmonary disease. Electronically Signed   By: Elsie Gravely M.D.   On: 10/03/2023 18:17    Microbiology: Results for orders placed or performed during the hospital encounter of 04/01/21  MRSA Next Gen by PCR, Nasal     Status: None   Collection Time: 04/02/21 12:00 AM  Result Value Ref Range Status   MRSA by PCR Next Gen NOT DETECTED NOT DETECTED Final    Comment: (NOTE) The GeneXpert MRSA Assay (FDA approved for NASAL specimens only), is one component of a comprehensive MRSA colonization surveillance program. It is not intended to diagnose MRSA infection nor to guide or monitor treatment for MRSA infections. Test performance is not FDA approved in patients  less than 10 years old. Performed at [facility], [address] [place], [place] [address]     Labs: CBC: Recent Labs  Lab 10/06/23 0512 10/09/23 0149 10/11/23 0306  WBC 12.2* 9.2 10.2  HGB 12.7* 12.2* 13.9  HCT 37.1* 34.9* 40.3  MCV 93.2 92.8 93.3  PLT 261 231 254   Basic Metabolic Panel: Recent Labs  Lab 10/06/23 0512 10/08/23 2132 10/09/23 0149 10/11/23 0306  NA 138 135 134* 135  K 3.9 4.0 3.9 4.4  CL 105 103 100 104  CO2 21* 22 22 22   GLUCOSE 103* 112* 115* 107*  BUN 15 10 8 9   CREATININE 1.10 1.16 1.03 1.09  CALCIUM  8.5* 8.3* 8.1* 8.5*  MG  --   --  1.9  --    Liver Function Tests: Recent Labs  Lab 10/09/23 0149 10/11/23 0306  AST 10* 22  ALT 14 23  ALKPHOS 43 45  BILITOT 1.0 1.5*  PROT 5.3* 5.8*  ALBUMIN 2.9* 3.3*   CBG: No results for input(s): GLUCAP in the last 168 hours.  Discharge time spent: approximately 45 minutes spent on discharge counseling, evaluation of patient on day of discharge, and coordination of discharge planning with nursing, social work, pharmacy and case management  Signed: Lonni SHAUNNA Dalton, MD [facility] 10/12/2023

## 2023-11-13 ENCOUNTER — Other Ambulatory Visit: Payer: Self-pay | Admitting: Nurse Practitioner

## 2023-11-13 DIAGNOSIS — E236 Other disorders of pituitary gland: Secondary | ICD-10-CM

## 2023-11-14 ENCOUNTER — Telehealth (HOSPITAL_COMMUNITY): Payer: Self-pay

## 2023-11-14 NOTE — Telephone Encounter (Signed)
 Outside/paper referral received by Dr. Tugaoen from [facility]. Will fax over request for EKG. Insurance benefits and eligibility to be determined.   Patient confirmed interest in program.  F/u appts 1/26 & 2/11.

## 2023-11-14 NOTE — Telephone Encounter (Signed)
 Patient confirmed he has [employer] Dual Complete insurance. Attempted to add [place] Medicaid to patient's coverage, Medicaid is not an active policy. Will assume [facility] Medicare benefits instead of Dual Complete.

## 2023-11-14 NOTE — Telephone Encounter (Signed)
 Pt insurance is active and benefits verified through [facility]. Co-pay $0, DED $257/$257 met, out of pocket $9,350/$2,684.11 met, co-insurance 20%. No pre-authorization required. Passport, 11/14/2023 @ 11:24am, REF# [id number].  TCR/ICR? ICR Visit(date of service)limitation? No Can multiple codes be used on the same date of service/visit?(IF ITS A LIMIT) N/A  Is this a lifetime maximum or an annual maximum? Annual Has the member used any of these services to date? No Is there a time limit (weeks/months) on start of program and/or program completion? No  *Patient coverage listed as Dual Complete, confirmed Medicaid coverage is not active through e-verification.

## 2023-11-14 NOTE — Telephone Encounter (Signed)
 Attempted to call patient regarding his f/u appts as patient is not scheduled for cardiology f/u until 2/11 (he should have a f/u sooner than that and we cannot schedule him until he does). No answer, left message for him to call us  back.

## 2023-11-15 ENCOUNTER — Telehealth (HOSPITAL_COMMUNITY): Payer: Self-pay

## 2023-11-15 NOTE — Telephone Encounter (Signed)
 Pt called and stated that he is going to call and see if he can get in sooner with his cardiologist for a f/u and when he does he will call and let us  know. I advised pt that we are still waiting on his recent EKG that we requested from Dr. Tugaoen.

## 2023-11-16 ENCOUNTER — Telehealth (HOSPITAL_COMMUNITY): Payer: Self-pay

## 2023-11-16 NOTE — Telephone Encounter (Signed)
 Pt moved his cardiology f/u to 11/13. Receieved pt EKG for cardiac rehab.

## 2023-11-22 ENCOUNTER — Telehealth (HOSPITAL_COMMUNITY): Payer: Self-pay

## 2023-11-22 NOTE — Telephone Encounter (Signed)
 [facility] and left a message for pt cardiologist nurse for them to send over pt 12 Lead EKG tracing for cardiac rehab.

## 2023-11-22 NOTE — Progress Notes (Signed)
 DIVISION OF CARDIOLOGY [facility] , [place] French       Date of Service: 11/22/2023  PCP: Referring Provider:  Claudene Prentice Dawn, FNP [address] [place] [address] Phone: [phone] Fax: [phone] Quinton Nunzio Kluver, MD [address] [place],  [place] [address] Phone: [phone] Fax: [phone]   Assessment and Plan:   Troy French is a 63 y.o. male who is following with Dr. 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   #Mild to moderate CAD of the RCA and LAD s/p multiple stents (most recently to mid LAD on 11/12/2023), chronically occluded Lcx #Angina and dyspnea on exertion - significantly improved after PCI to mLAD on 11/12/2023 #Active tobacco use #HTN #HLD Follows with Dr. Quinton but was initially seen acutely in our clinic in 10/2023 for episodes of severe exertional chest pain despite 2 anti-anginals and progressive dyspnea on exertion, concerning for progression of CAD and possible HFpEF. He underwent RLHC on 11/12/2023 which demonstrated normal filling pressures and 80% mid LAD stenosis s/p PCI & a chronic total occlusion of the LCX. Since his PCI, his angina and dyspnea on exertion have significantly improved. - Continue DAPT with ASA/Plavix  - Continue atorvastatin  40mg  daily - LDL 43 (Dec 2024)  - Continue lisinopril  10mg  daily - BP at goal  - Continue metoprolol  succinate 100mg  daily - Stop lasix given there is no evidence of HFpEF on RHC - counseled to restart if he has fluid accumulation - Continue Ranexa   - Continue to encourage tobacco cessation - currently 3 cigs/day while using patch -  Planning for Cardiac Rehab - Previously referred to pulmonology for progressive DOE    The patient was seen and discussed with Dr. Gil.  No follow-ups on file.   Subjective:   Brief HPI: [name] is a 63 y.o. male who is following with Dr. 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  Reports he is felling much better since his PCI to 80% mid LAD stenosis 11/12/2023 with respect to his chest pain and dyspnea. Has only been taking Plavix  since his PCI. He is enthusiastic about starting with cardiac rehab.  Cardiovascular Review of Systems: - Chest pain or discomfort (exertional, rest): Significantly improved - Dyspnea (rest dyspnea, exertional dyspnea, orthopnea, paroxysmal nocturnal dyspnea): Significantly improved - Syncope or presyncope: No - Edema: No - Claudication: No - Palpitations: No - Bleeding: No  Other reviewed pertinent positive and negatives are documented in the HPI.    Cardiovascular History & Procedures:  Echocardiogram:  12/2022 Summary   1. The left ventricle is normal in size with normal wall thickness.   2. The left ventricular systolic function is normal, LVEF is visually estimated at > 55%.   3. The right ventricle is normal in size, with normal systolic  function. Stress test:  NA CV Imaging:  NA Cardiac catheterization:  09/2021 CONCLUSIONS: - Normal right and left sided filling pressures - No evidence of pulmonary hypertension - Normal cardiac output and index - Two vessel coronary artery disease including 80% mid LAD stenosis and a chronic total occlusion of the LCX; widely patent stents in the RCA - Successful  OCT-guided PCI of the mid LAD with a 2.5 x 12mm Xience Skypoint DES with excellent result  EP studies:  [facility] 08/2021 Patient had a min HR of 46 bpm, max HR of 142 bpm, and avg HR of 87 bpm. Predominant underlying rhythm was Sinus Rhythm. 1 run of Supraventricular Tachycardia occurred lasting 4 beats with a max rate of 128 bpm (avg 113 bpm). Isolated SVEs were rare (<1.0%), SVE Couplets were rare (<1.0%), and no SVE Triplets were present. Isolated VEs were rare (<1.0%), and no VE Couplets or VE Triplets were present. Patient triggered events were associated with sinus rhythm.  CV surgeries:  NA Peripheral vascular:  NA  Past medical history, medications, allergies, social history, and family history were reviewed and updated in Epic as appropriate.  Objective:    Vitals: BP 104/64 (BP Site: L Arm, BP Position: Sitting, BP Cuff Size: Medium)   Pulse 75   Ht 170.2 cm (5' 7.01)   Wt 74.5 kg (164 lb 3.2 oz)   SpO2 97%   BMI 25.71 kg/m   Wt Readings from Last 3 Encounters:  11/22/23 74.5 kg (164 lb 3.2 oz)  11/12/23 73.5 kg (162 lb)  11/01/23 73.8 kg (162 lb 12.8 oz)    Physical Exam: General: Alert, NAD, sitting up comfortably in chair at side of exam table. Cardiac: Regular rate and rhythm. No appreciable murmurs. No JVD at 90 degrees.  Pulmonary: CTAB, no increased work of breathing. Extremities: No LE edema. Legs are warm. Neuro: Alert and conversing appropriately. No gross focal deficits.  EKG: NSR with q-wave in lead III (previously reviewed)  Pertinent labs: Lab Results  Component Value Date   Creatinine 1.13 11/01/2023   Potassium 4.1 11/01/2023   Cholesterol, LDL, Calculated 43 12/20/2022   Cholesterol, HDL 39 (L) 12/20/2022   Hemoglobin A1C 5.3 02/16/2021   BNP 10.07 08/17/2021    Margaree Moros, MD Cardiovascular Medicine Fellow  PGY-4

## 2023-11-23 NOTE — Progress Notes (Signed)
 I saw and evaluated the patient, participating in the key portions of the service. I reviewed the fellow???s note. I agree with the fellow???s findings and plan.     Troy FELIX Gil, MD, [facility], [facility]  Assistant Professor of Medicine  Division of Cardiology  [facility]

## 2023-11-26 NOTE — Telephone Encounter (Signed)
 Refill request received for patient.    Medication Requested: metoPROLOL  succinate (TROPROL-XL) 100mg  24hr tablet Last Office Visit: 11/22/2023  Next Office Visit: 02/13/2024 Last Prescriber: Nunzio Ann, MD  Nurse refill requirements met? Yes If not met, why:   Sent to: Pharmacy per protocol If sent to provider, which provider?:

## 2023-12-02 ENCOUNTER — Inpatient Hospital Stay: Admission: RE | Admit: 2023-12-02 | Source: Ambulatory Visit

## 2023-12-04 ENCOUNTER — Telehealth (HOSPITAL_COMMUNITY): Payer: Self-pay

## 2023-12-04 NOTE — Telephone Encounter (Signed)
 Called patient to schedule cardiac rehab, patient states he will call us  back shortly.

## 2023-12-05 NOTE — Telephone Encounter (Signed)
 Called patient to see if he was interested in participating in the Cardiac Rehab Program. Patient will come in for orientation on 12/16 and will attend the 1:45 exercise class.  Sent MyChart message.

## 2023-12-21 ENCOUNTER — Telehealth (HOSPITAL_COMMUNITY): Payer: Self-pay

## 2023-12-21 NOTE — Telephone Encounter (Signed)
 Attempted to confirm cardiac rehab orientation date of 12/25/23 @ 1315.  Location, what to bring, footwear, and approximate length of orientation given in message.

## 2023-12-24 ENCOUNTER — Other Ambulatory Visit: Payer: Self-pay | Admitting: Nurse Practitioner

## 2023-12-24 DIAGNOSIS — F17210 Nicotine dependence, cigarettes, uncomplicated: Secondary | ICD-10-CM

## 2023-12-25 ENCOUNTER — Encounter (HOSPITAL_COMMUNITY): Admission: RE | Admit: 2023-12-25 | Source: Ambulatory Visit

## 2023-12-25 ENCOUNTER — Other Ambulatory Visit: Payer: Self-pay | Admitting: Nurse Practitioner

## 2023-12-25 DIAGNOSIS — E236 Other disorders of pituitary gland: Secondary | ICD-10-CM

## 2023-12-31 ENCOUNTER — Encounter (HOSPITAL_COMMUNITY)

## 2024-01-02 ENCOUNTER — Encounter (HOSPITAL_COMMUNITY)

## 2024-01-04 ENCOUNTER — Encounter (HOSPITAL_COMMUNITY)

## 2024-01-07 ENCOUNTER — Encounter (HOSPITAL_COMMUNITY)

## 2024-01-07 ENCOUNTER — Other Ambulatory Visit

## 2024-01-08 ENCOUNTER — Inpatient Hospital Stay: Admission: RE | Admit: 2024-01-08 | Source: Ambulatory Visit

## 2024-01-08 ENCOUNTER — Inpatient Hospital Stay: Admission: RE | Admit: 2024-01-08 | Discharge: 2024-01-08 | Attending: Nurse Practitioner

## 2024-01-08 DIAGNOSIS — F17210 Nicotine dependence, cigarettes, uncomplicated: Secondary | ICD-10-CM

## 2024-01-09 ENCOUNTER — Encounter (HOSPITAL_COMMUNITY)

## 2024-01-11 ENCOUNTER — Encounter (HOSPITAL_COMMUNITY)

## 2024-01-14 ENCOUNTER — Encounter (HOSPITAL_COMMUNITY)

## 2024-01-16 ENCOUNTER — Encounter (HOSPITAL_COMMUNITY)

## 2024-01-18 ENCOUNTER — Encounter (HOSPITAL_COMMUNITY)

## 2024-01-21 ENCOUNTER — Encounter (HOSPITAL_COMMUNITY)

## 2024-01-23 ENCOUNTER — Encounter (HOSPITAL_COMMUNITY)

## 2024-01-25 ENCOUNTER — Encounter (HOSPITAL_COMMUNITY)

## 2024-01-27 ENCOUNTER — Ambulatory Visit
Admission: RE | Admit: 2024-01-27 | Discharge: 2024-01-27 | Disposition: A | Source: Ambulatory Visit | Attending: Nurse Practitioner

## 2024-01-27 DIAGNOSIS — E236 Other disorders of pituitary gland: Secondary | ICD-10-CM

## 2024-01-27 MED ORDER — GADOPICLENOL 0.5 MMOL/ML IV SOLN
7.0000 mL | Freq: Once | INTRAVENOUS | Status: AC | PRN
  Administered 2024-01-27: 7 mL via INTRAVENOUS

## 2024-01-28 ENCOUNTER — Encounter (HOSPITAL_COMMUNITY)

## 2024-01-30 ENCOUNTER — Encounter (HOSPITAL_COMMUNITY)

## 2024-02-01 ENCOUNTER — Encounter (HOSPITAL_COMMUNITY)

## 2024-02-04 ENCOUNTER — Encounter (HOSPITAL_COMMUNITY)

## 2024-02-06 ENCOUNTER — Encounter (HOSPITAL_COMMUNITY)

## 2024-02-08 ENCOUNTER — Encounter (HOSPITAL_COMMUNITY)

## 2024-02-11 ENCOUNTER — Encounter (HOSPITAL_COMMUNITY)

## 2024-02-13 ENCOUNTER — Encounter (HOSPITAL_COMMUNITY)

## 2024-02-15 ENCOUNTER — Encounter (HOSPITAL_COMMUNITY)

## 2024-02-18 ENCOUNTER — Encounter (HOSPITAL_COMMUNITY)

## 2024-02-20 ENCOUNTER — Encounter (HOSPITAL_COMMUNITY)

## 2024-02-22 ENCOUNTER — Encounter (HOSPITAL_COMMUNITY)

## 2024-02-25 ENCOUNTER — Encounter (HOSPITAL_COMMUNITY)

## 2024-02-27 ENCOUNTER — Encounter (HOSPITAL_COMMUNITY)

## 2024-02-29 ENCOUNTER — Encounter (HOSPITAL_COMMUNITY)

## 2024-03-03 ENCOUNTER — Encounter (HOSPITAL_COMMUNITY)

## 2024-03-05 ENCOUNTER — Encounter (HOSPITAL_COMMUNITY)

## 2024-03-07 ENCOUNTER — Encounter (HOSPITAL_COMMUNITY)

## 2024-03-10 ENCOUNTER — Encounter (HOSPITAL_COMMUNITY)

## 2024-03-12 ENCOUNTER — Encounter (HOSPITAL_COMMUNITY)

## 2024-03-14 ENCOUNTER — Encounter (HOSPITAL_COMMUNITY)

## 2024-03-17 ENCOUNTER — Encounter (HOSPITAL_COMMUNITY)

## 2024-03-19 ENCOUNTER — Encounter (HOSPITAL_COMMUNITY)
